# Patient Record
Sex: Female | Born: 1955 | Race: White | Hispanic: No | Marital: Single | Smoking: Former smoker
Health system: Southern US, Community
[De-identification: ages and names within clinical notes are randomized; demographics above are authoritative.]

## PROBLEM LIST (undated history)

## (undated) DIAGNOSIS — E785 Hyperlipidemia, unspecified: Secondary | ICD-10-CM

## (undated) DIAGNOSIS — G4730 Sleep apnea, unspecified: Secondary | ICD-10-CM

## (undated) DIAGNOSIS — K219 Gastro-esophageal reflux disease without esophagitis: Secondary | ICD-10-CM

## (undated) DIAGNOSIS — R0600 Dyspnea, unspecified: Secondary | ICD-10-CM

## (undated) DIAGNOSIS — H93293 Other abnormal auditory perceptions, bilateral: Secondary | ICD-10-CM

## (undated) DIAGNOSIS — J449 Chronic obstructive pulmonary disease, unspecified: Secondary | ICD-10-CM

## (undated) DIAGNOSIS — D649 Anemia, unspecified: Secondary | ICD-10-CM

## (undated) DIAGNOSIS — G2581 Restless legs syndrome: Secondary | ICD-10-CM

## (undated) DIAGNOSIS — B029 Zoster without complications: Secondary | ICD-10-CM

## (undated) DIAGNOSIS — E538 Deficiency of other specified B group vitamins: Secondary | ICD-10-CM

## (undated) DIAGNOSIS — L900 Lichen sclerosus et atrophicus: Secondary | ICD-10-CM

## (undated) DIAGNOSIS — I10 Essential (primary) hypertension: Secondary | ICD-10-CM

## (undated) DIAGNOSIS — Z973 Presence of spectacles and contact lenses: Secondary | ICD-10-CM

## (undated) DIAGNOSIS — Z9109 Other allergy status, other than to drugs and biological substances: Secondary | ICD-10-CM

## (undated) DIAGNOSIS — A6000 Herpesviral infection of urogenital system, unspecified: Secondary | ICD-10-CM

## (undated) DIAGNOSIS — F988 Other specified behavioral and emotional disorders with onset usually occurring in childhood and adolescence: Secondary | ICD-10-CM

## (undated) DIAGNOSIS — K5790 Diverticulosis of intestine, part unspecified, without perforation or abscess without bleeding: Secondary | ICD-10-CM

## (undated) DIAGNOSIS — K2270 Barrett's esophagus without dysplasia: Secondary | ICD-10-CM

## (undated) DIAGNOSIS — E669 Obesity, unspecified: Secondary | ICD-10-CM

## (undated) DIAGNOSIS — Z8719 Personal history of other diseases of the digestive system: Secondary | ICD-10-CM

## (undated) DIAGNOSIS — R29898 Other symptoms and signs involving the musculoskeletal system: Secondary | ICD-10-CM

## (undated) DIAGNOSIS — F419 Anxiety disorder, unspecified: Secondary | ICD-10-CM

## (undated) DIAGNOSIS — K635 Polyp of colon: Secondary | ICD-10-CM

## (undated) DIAGNOSIS — M1990 Unspecified osteoarthritis, unspecified site: Secondary | ICD-10-CM

## (undated) DIAGNOSIS — Z8669 Personal history of other diseases of the nervous system and sense organs: Secondary | ICD-10-CM

## (undated) DIAGNOSIS — B001 Herpesviral vesicular dermatitis: Secondary | ICD-10-CM

## (undated) HISTORY — DX: Unspecified osteoarthritis, unspecified site: M19.90

## (undated) HISTORY — DX: Herpesviral vesicular dermatitis: B00.1

## (undated) HISTORY — PX: OTHER SURGICAL HISTORY: SHX169

## (undated) HISTORY — DX: Essential (primary) hypertension: I10

## (undated) HISTORY — DX: Deficiency of other specified B group vitamins: E53.8

## (undated) HISTORY — DX: Other specified behavioral and emotional disorders with onset usually occurring in childhood and adolescence: F98.8

## (undated) HISTORY — DX: Anemia, unspecified: D64.9

## (undated) HISTORY — DX: Anxiety disorder, unspecified: F41.9

## (undated) HISTORY — DX: Polyp of colon: K63.5

## (undated) HISTORY — DX: Gastro-esophageal reflux disease without esophagitis: K21.9

## (undated) HISTORY — DX: Lichen sclerosus et atrophicus: L90.0

## (undated) HISTORY — PX: INDUCED ABORTION: SHX677

## (undated) HISTORY — PX: JOINT REPLACEMENT: SHX530

## (undated) HISTORY — DX: Hyperlipidemia, unspecified: E78.5

## (undated) HISTORY — DX: Chronic obstructive pulmonary disease, unspecified: J44.9

## (undated) HISTORY — DX: Other abnormal auditory perceptions, bilateral: H93.293

---
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
## 1996-09-08 HISTORY — PX: BREAST CYST ASPIRATION: SHX578

## 2000-01-24 ENCOUNTER — Encounter: Admission: RE | Admit: 2000-01-24 | Discharge: 2000-01-24 | Payer: Self-pay | Admitting: Infectious Diseases

## 2003-09-09 HISTORY — PX: PLANTAR FASCIA SURGERY: SHX746

## 2004-10-23 ENCOUNTER — Ambulatory Visit: Payer: Self-pay

## 2005-11-19 ENCOUNTER — Ambulatory Visit: Payer: Self-pay

## 2005-11-25 ENCOUNTER — Ambulatory Visit: Payer: Self-pay

## 2007-01-28 ENCOUNTER — Ambulatory Visit: Payer: Self-pay

## 2007-02-02 ENCOUNTER — Ambulatory Visit: Payer: Self-pay

## 2007-06-22 ENCOUNTER — Ambulatory Visit: Payer: Self-pay

## 2007-06-22 ENCOUNTER — Other Ambulatory Visit: Payer: Self-pay

## 2007-07-21 ENCOUNTER — Ambulatory Visit: Payer: Self-pay

## 2008-02-04 ENCOUNTER — Ambulatory Visit: Payer: Self-pay

## 2008-05-30 ENCOUNTER — Ambulatory Visit: Payer: Self-pay | Admitting: Gastroenterology

## 2008-09-08 HISTORY — PX: BUNIONECTOMY: SHX129

## 2008-12-27 ENCOUNTER — Ambulatory Visit: Payer: Self-pay | Admitting: Unknown Physician Specialty

## 2009-02-07 ENCOUNTER — Ambulatory Visit: Payer: Self-pay

## 2009-06-14 ENCOUNTER — Ambulatory Visit: Payer: Self-pay | Admitting: Podiatry

## 2010-04-16 ENCOUNTER — Ambulatory Visit: Payer: Self-pay

## 2010-09-17 ENCOUNTER — Emergency Department: Payer: Self-pay | Admitting: Emergency Medicine

## 2010-10-18 ENCOUNTER — Ambulatory Visit: Payer: Self-pay | Admitting: Internal Medicine

## 2011-04-23 ENCOUNTER — Ambulatory Visit: Payer: Self-pay | Admitting: Internal Medicine

## 2012-09-08 HISTORY — PX: COLONOSCOPY: SHX174

## 2012-12-14 ENCOUNTER — Ambulatory Visit: Payer: Self-pay | Admitting: Gastroenterology

## 2014-06-22 DIAGNOSIS — D239 Other benign neoplasm of skin, unspecified: Secondary | ICD-10-CM

## 2014-06-22 HISTORY — DX: Other benign neoplasm of skin, unspecified: D23.9

## 2015-08-06 ENCOUNTER — Other Ambulatory Visit: Payer: Self-pay | Admitting: Internal Medicine

## 2015-08-06 DIAGNOSIS — M79661 Pain in right lower leg: Secondary | ICD-10-CM

## 2015-08-09 ENCOUNTER — Ambulatory Visit
Admission: RE | Admit: 2015-08-09 | Discharge: 2015-08-09 | Disposition: A | Discharge status: Home / Self Care | Payer: BC Managed Care – PPO | Source: Ambulatory Visit | Attending: Internal Medicine | Admitting: Internal Medicine

## 2015-08-09 DIAGNOSIS — M79661 Pain in right lower leg: Secondary | ICD-10-CM | POA: Insufficient documentation

## 2016-01-31 DIAGNOSIS — F909 Attention-deficit hyperactivity disorder, unspecified type: Secondary | ICD-10-CM | POA: Insufficient documentation

## 2016-11-11 ENCOUNTER — Other Ambulatory Visit: Payer: Self-pay | Admitting: Certified Nurse Midwife

## 2016-11-27 ENCOUNTER — Other Ambulatory Visit: Payer: Self-pay | Admitting: Internal Medicine

## 2016-11-27 DIAGNOSIS — Z1231 Encounter for screening mammogram for malignant neoplasm of breast: Secondary | ICD-10-CM

## 2016-12-08 ENCOUNTER — Other Ambulatory Visit: Payer: Self-pay | Admitting: Internal Medicine

## 2016-12-08 ENCOUNTER — Ambulatory Visit
Admission: RE | Admit: 2016-12-08 | Discharge: 2016-12-08 | Disposition: A | Discharge status: Home / Self Care | Payer: BC Managed Care – PPO | Source: Ambulatory Visit | Attending: Internal Medicine | Admitting: Internal Medicine

## 2016-12-08 DIAGNOSIS — Z1231 Encounter for screening mammogram for malignant neoplasm of breast: Secondary | ICD-10-CM

## 2016-12-16 ENCOUNTER — Inpatient Hospital Stay
Admission: RE | Admit: 2016-12-16 | Discharge: 2016-12-16 | Disposition: A | Discharge status: Home / Self Care | Payer: Self-pay | Source: Ambulatory Visit | Attending: *Deleted | Admitting: *Deleted

## 2016-12-16 ENCOUNTER — Other Ambulatory Visit: Payer: Self-pay | Admitting: *Deleted

## 2016-12-16 DIAGNOSIS — Z1231 Encounter for screening mammogram for malignant neoplasm of breast: Secondary | ICD-10-CM

## 2016-12-25 ENCOUNTER — Ambulatory Visit: Payer: Self-pay | Admitting: Certified Nurse Midwife

## 2017-01-07 ENCOUNTER — Ambulatory Visit: Payer: Self-pay | Admitting: Certified Nurse Midwife

## 2017-02-03 ENCOUNTER — Ambulatory Visit (INDEPENDENT_AMBULATORY_CARE_PROVIDER_SITE_OTHER): Payer: BC Managed Care – PPO | Admitting: Certified Nurse Midwife

## 2017-02-03 ENCOUNTER — Encounter: Payer: Self-pay | Admitting: Certified Nurse Midwife

## 2017-02-03 VITALS — BP 124/78 | Ht 67.0 in | Wt 218.0 lb

## 2017-02-03 DIAGNOSIS — L900 Lichen sclerosus et atrophicus: Secondary | ICD-10-CM | POA: Diagnosis not present

## 2017-02-03 DIAGNOSIS — E7800 Pure hypercholesterolemia, unspecified: Secondary | ICD-10-CM

## 2017-02-03 DIAGNOSIS — E538 Deficiency of other specified B group vitamins: Secondary | ICD-10-CM

## 2017-02-03 DIAGNOSIS — Z124 Encounter for screening for malignant neoplasm of cervix: Secondary | ICD-10-CM

## 2017-02-03 DIAGNOSIS — Z01419 Encounter for gynecological examination (general) (routine) without abnormal findings: Secondary | ICD-10-CM | POA: Diagnosis not present

## 2017-02-03 DIAGNOSIS — I10 Essential (primary) hypertension: Secondary | ICD-10-CM

## 2017-02-03 MED ORDER — CLOBETASOL PROPIONATE 0.05 % EX CREA: 1.0000 "application " | TOPICAL_CREAM | Freq: Two times a day (BID) | CUTANEOUS | 1 refills | Status: DC

## 2017-02-03 NOTE — Patient Instructions (Signed)
Preventing Osteoporosis, Adult Osteoporosis is a condition that causes the bones to get weaker. With osteoporosis, the bones become thinner, and the normal spaces in bone tissue become larger. This can make the bones weak and cause them to break more easily. People who have osteoporosis are more likely to break their wrist, spine, or hip. Even a minor accident or injury can be enough to break weak bones. Osteoporosis can occur with aging. Your body constantly replaces old bone tissue with new tissue. As you get older, you may lose bone tissue more quickly, or it may be replaced more slowly. Osteoporosis is more likely to develop if you have poor nutrition or do not get enough calcium or vitamin D. Other lifestyle factors can also play a role. By making some diet and lifestyle changes, you can help to keep your bones healthy and help to prevent osteoporosis. What nutrition changes can be made? Nutrition plays an important role in maintaining healthy, strong bones.  Make sure you get enough calcium every day from food or from calcium supplements.  If you are age 49 or younger, aim to get 1,000 mg of calcium every day.  If you are older than age 66, aim to get 1,200 mg of calcium every day.  Try to get enough vitamin D every day.  If you are age 5 or younger, aim to get 600 international units (IU) every day.  If you are older than age 77, aim to get 800 international units every day.  Follow a healthy diet. Eat plenty of foods that contain calcium and vitamin D.  Calcium is in milk, cheese, yogurt, and other dairy products. Some fish and vegetables are also good sources of calcium. Many foods such as cereals and breads have had calcium added to them (are fortified). Check nutrition labels to see how much calcium is in a food or drink.  Foods that contain vitamin D include milk, cereals, salmon, and tuna. Your body also makes vitamin D when you are out in the sun. Bare skin exposure to the sun on  your face, arms, legs, or back for no more than 30 minutes a day, 2 times per week is more than enough. Beyond that, it is important to use sunblock to protect your skin from sunburn, which increases your risk for skin cancer. What lifestyle changes can be made? Making changes in your everyday life can also play an important role in preventing osteoporosis.  Stay active and get exercise every day. Ask your health care provider what types of exercise are best for you.  Do not use any products that contain nicotine or tobacco, such as cigarettes and e-cigarettes. If you need help quitting, ask your health care provider.  Limit alcohol intake to no more than 1 drink a day for nonpregnant women and 2 drinks a day for men. One drink equals 12 oz of beer, 5 oz of wine, or 1 oz of hard liquor. Why are these changes important? Making these nutrition and lifestyle changes can:  Help you develop and maintain healthy, strong bones.  Prevent loss of bone mass and the problems that are caused by that loss, such as broken bones and delayed healing.  Make you feel better mentally and physically. What can happen if changes are not made? Problems that can result from osteoporosis can be very serious. These may include:  A higher risk of broken bones that are painful and do not heal well.  Physical malformations, such as a collapsed spine  or a hunched back.  Problems with movement. Where to find support: If you need help making changes to prevent osteoporosis, talk with your health care provider. You can ask for a referral to a diet and nutrition specialist (dietitian) and a physical therapist. Where to find more information: Learn more about osteoporosis from:  NIH Osteoporosis and Related [place]: www.niams.GolfingGoddess.com.br  U.S. Office on Women's Health:  SouvenirBaseball.es.html  National Osteoporosis Foundation: ProfilePeek.ch Summary  Osteoporosis is a condition that causes weak bones that are more likely to break.  Eating a healthy diet and making sure you get enough calcium and vitamin D can help prevent osteoporosis.  Other ways to reduce your risk of osteoporosis include getting regular exercise and avoiding alcohol and products that contain nicotine or tobacco. This information is not intended to replace advice given to you by your health care provider. Make sure you discuss any questions you have with your health care provider. Document Released: 09/09/2015 Document Revised: 05/05/2016 Document Reviewed: 05/05/2016 Elsevier Interactive Patient Education  6295 [employer] Lichen Sclerosus Lichen sclerosus is a skin problem. It can happen on any part of the body, but it commonly involves the anal or genital areas. It can cause itching and discomfort in these areas. Treatment can help to control symptoms. When the genital area is affected, getting treatment is important because the condition can cause scarring that may lead to other problems. What are the causes? The cause of this condition is not known. It could be the result of an overactive immune system or a lack of certain hormones. Lichen sclerosus is not an infection or a fungus. It is not passed from one person to another (not contagious). What increases the risk? This condition is more likely to develop in women, usually after menopause. What are the signs or symptoms? Symptoms of this condition include:  Thin, wrinkled, white areas on the skin.  Thickened white areas on the skin.  Red and swollen patches (lesions) on the skin.  Tears or cracks in the skin.  Bruising.  Blood blisters.  Severe itching. You may also have pain, itching, or burning with urination. Constipation is also  common in people with lichen sclerosus. How is this diagnosed? This condition may be diagnosed with a physical exam. In some cases, a tissue sample (biopsy sample) may be removed to be looked at under a microscope. How is this treated? This condition is usually treated with medicated creams or ointments (topical steroids) that are applied over the affected areas. Follow these instructions at home:  Take over-the-counter and prescription medicines only as told by your health care provider.  Use creams or ointments as told by your health care provider.  Do not scratch the affected areas of skin.  Women should keep the vaginal area as clean and dry as possible.  Keep all follow-up visits as told by your health care provider. This is important. Contact a health care provider if:  You have increasing redness, swelling, or pain in the affected area.  You have fluid, blood, or pus coming from the affected area.  You have new lesions on your skin.  You have a fever.  You have pain during sex. This information is not intended to replace advice given to you by your health care provider. Make sure you discuss any questions you have with your health care provider. Document Released: 01/15/2011 Document Revised: 01/31/2016 Document Reviewed: 11/20/2014 Elsevier Interactive Patient Education  2017 [employer].

## 2017-02-03 NOTE — Progress Notes (Signed)
Gynecology Annual Exam  PCP: Idelle Crouch, MD  Chief Complaint:  Chief Complaint  Patient presents with  . Annual Exam    History of Present Illness:Mackenzie [name] is a 61 year old White female , G 3 P 2 0 1 2 who presents for her annual exam. She has lichen sclerosis and has had frequent vulvar itching since her last visit. She uses the clobetasol prn itching. She has probably used the steroid cream 10x in the past year.  Her menses are absent and she is postmenopausal since 2011.  She has had no spotting. Has an occasional hot flash   The patient's past medical history is notable for a history of lichen sclerosis, anemia, osteoarthritis, vitamin B12 deficiency, hyperlipidemia, GERD, hypertension,and ADD.Marland Kitchen  Since her last annual GYN exam dated 1/ 24/2017, she she has been diagnosed with COPD. SHe quit smoking Nov 2017. She is sexually active.  She denies dyspareunia.  Her most recent pap smear was obtained 10/02/15 and was negative. She has had four normal Pap smears since her ASCUS R/O HGSIL Pap and a colpo/bx that was negative in 2012.  Her most recent mammogram obtained on 12/08/2016 was negative.. There is a positive history of breast cancer in her maternal second cousin and paternal grandmother. Genetic testing has not been done. There is no family history of ovarian cancer. The patient does not do monthly self breast exams.  She had a colonoscopy in April 2014 by Dr Donnella Sham that she reports was positive for colon polyps. . Her next colonoscopy is due in 5 years. She had a bone density scan in 2015 that showed mild osteopenia of her spine, but hip T score was normal.   The patient is a former smoker.  The patient does drink occasionally  The patient does not use illegal drugs.  The patient does not exercise..  The patient does not get adequate calcium in her diet.  She had a recent cholesterol screen in 2018 which was normal on her statin.   The patient denies current symptoms  of depression.    Review of Systems: Review of Systems  Constitutional: Negative for chills, fever and weight loss.  HENT: Negative for congestion, sinus pain and sore throat.   Eyes: Negative for blurred vision and pain.  Respiratory: Negative for hemoptysis, shortness of breath and wheezing.   Cardiovascular: Negative for chest pain, palpitations and leg swelling.  Gastrointestinal: Negative for abdominal pain, blood in stool, diarrhea, heartburn, nausea and vomiting.  Genitourinary: Negative for dysuria, frequency, hematuria and urgency.       Positive for vulvar itching  Musculoskeletal: Positive for joint pain (arms). Negative for back pain and myalgias.  Skin: Negative for itching and rash.  Neurological: Negative for dizziness, tingling and headaches.  Endo/Heme/Allergies: Negative for environmental allergies and polydipsia. Does not bruise/bleed easily.       Negative for hirsutism   Psychiatric/Behavioral: Negative for depression. The patient has insomnia. The patient is not nervous/anxious.     Past Medical History:  Past Medical History:  Diagnosis Date  . Anemia   . Anxiety   . COPD (chronic obstructive pulmonary disease) ([place])   . Essential hypertension   . Hyperlipidemia   . Osteoarthritis   . Vitamin B12 deficiency     Past Surgical History:  Past Surgical History:  Procedure Laterality Date  . BREAST CYST ASPIRATION Right    neg  . BUNIONECTOMY Bilateral   . COLONOSCOPY  2014  Family History:  Family History  Problem Relation Age of Onset  . Breast cancer Cousin 68       mat second cousin  . Liver cancer Maternal Aunt   . Alcoholism Mother   . Hypertension Mother   . Diabetes Mother   . Congestive Heart Failure Mother   . Diabetes Maternal Grandmother   . Breast cancer Paternal Grandmother 50    Social History:  Social History   Social History  . Marital status: Single    Spouse name: N/A  . Number of children: 2  . Years of education:  N/A   Occupational History  . Not on file.   Social History Main Topics  . Smoking status: Former Research scientist (life sciences)  . Smokeless tobacco: Never Used  . Alcohol use 0.0 - 1.2 oz/week  . Drug use: No  . Sexual activity: Yes    Partners: Male    Birth control/ protection: Post-menopausal   Other Topics Concern  . Not on file   Social History Narrative  . No narrative on file    Allergies:  No Known Allergies  Medications: Prior to Admission medications   Medication Sig Start Date End Date Taking? Authorizing Provider  ALPRAZolam (XANAX) 0.5 MG tablet TAKE 1 TABLET BY MOUTH NIGHTLY AS NEEDED 12/29/16  Yes [provider]  amphetamine-dextroamphetamine (ADDERALL) 20 MG tablet TAKE 1 TABLET BY MOUTH 2 TIMES DAILY *DNF 12/28/16* 01/06/17  Yes [provider]  celecoxib (CELEBREX) 200 MG capsule Take 200 mg by mouth daily. 01/12/17  Yes [provider]  clobetasol ointment (TEMOVATE) 0.05 %  03/20/14  Yes [provider]  cyanocobalamin (,VITAMIN B-12,) 1000 MCG/ML injection Inject into the muscle. 02/05/16  Yes [provider]  ferrous sulfate 325 (65 FE) MG tablet TAKE 1 TABLET (325 MG TOTAL) BY MOUTH 2 (TWO) TIMES DAILY. 01/12/17  Yes [provider]  hydrochlorothiazide (HYDRODIURIL) 25 MG tablet TAKE 1 TABLET (25 MG TOTAL) BY MOUTH EVERY MORNING. 01/21/17  Yes [provider]  lovastatin (MEVACOR) 40 MG tablet TAKE 1 TABLET (40 MG TOTAL) BY MOUTH NIGHTLY. 01/25/17  Yes [provider]    Physical Exam Vitals: BP 124/78   Wt 218 lb (98.9 kg) , BMI 34.14 kg/m2  General: NAD HEENT: normocephalic, anicteric Neck: no thyroid enlargement, no palpable nodules, no cervical lymphadenopathy  Pulmonary: No increased work of breathing, CTAB Cardiovascular: RRR, without murmur  Breast: Breast symmetrical, no tenderness, no palpable nodules or masses, no skin or nipple retraction present, no nipple discharge.  No axillary, infraclavicular  or supraclavicular lymphadenopathy. Abdomen: Soft, non-tender, non-distended.  Umbilicus without lesions.  No hepatomegaly or masses palpable. No evidence of hernia. Genitourinary:  External: whitening at introitus, perineum, and perirectal areas  Vagina: Normal vaginal mucosa, no evidence of prolapse.    Cervix: Grossly normal in appearance, no bleeding, non-tender  Uterus: Retroverted, normal size, shape, and consistency, mobile, and non-tender  Adnexa: No adnexal masses, non-tender  Rectal: deferred  Lymphatic: no evidence of inguinal lymphadenopathy Extremities: no edema, erythema, or tenderness Neurologic: Grossly intact Psychiatric: mood appropriate, affect full     Assessment: 61 y.o. [id number] well woman exam Lichen sclerosis et atrophicus  Plan:   1) Breast cancer screening - recommend monthly self breast exam and annual mammograms  2) RX for clobetasol ointment 0.05% -use BID x 2 weeks then daily until symptoms resolved.  3) Cervical cancer screening - Pap was done.   4) Routine healthcare maintenance including cholesterol and diabetes screening managed  by PCP   5) Discussed calcium and vitamin D3 requirements as well as exercises to decrease risk of osteoporosis  6) Colonoscopy due next year.  7) RTO 1 year and prn  Dalia Heading, CNM

## 2017-02-05 LAB — IGP, APTIMA HPV
HPV Aptima: NEGATIVE
PAP Smear Comment: 0

## 2017-02-08 ENCOUNTER — Encounter: Payer: Self-pay | Admitting: Certified Nurse Midwife

## 2017-02-08 DIAGNOSIS — J449 Chronic obstructive pulmonary disease, unspecified: Secondary | ICD-10-CM | POA: Insufficient documentation

## 2017-02-08 DIAGNOSIS — I10 Essential (primary) hypertension: Secondary | ICD-10-CM | POA: Insufficient documentation

## 2017-02-08 DIAGNOSIS — E538 Deficiency of other specified B group vitamins: Secondary | ICD-10-CM | POA: Insufficient documentation

## 2017-02-08 DIAGNOSIS — E785 Hyperlipidemia, unspecified: Secondary | ICD-10-CM | POA: Insufficient documentation

## 2017-02-08 DIAGNOSIS — L900 Lichen sclerosus et atrophicus: Secondary | ICD-10-CM | POA: Insufficient documentation

## 2017-02-08 DIAGNOSIS — D649 Anemia, unspecified: Secondary | ICD-10-CM | POA: Insufficient documentation

## 2017-08-05 ENCOUNTER — Other Ambulatory Visit: Payer: Self-pay | Admitting: Internal Medicine

## 2017-08-05 DIAGNOSIS — M5137 Other intervertebral disc degeneration, lumbosacral region: Secondary | ICD-10-CM

## 2017-08-05 DIAGNOSIS — M51379 Other intervertebral disc degeneration, lumbosacral region without mention of lumbar back pain or lower extremity pain: Secondary | ICD-10-CM

## 2017-08-07 ENCOUNTER — Ambulatory Visit: Payer: BC Managed Care – PPO

## 2017-09-08 DIAGNOSIS — K2270 Barrett's esophagus without dysplasia: Secondary | ICD-10-CM

## 2017-09-08 DIAGNOSIS — H93293 Other abnormal auditory perceptions, bilateral: Secondary | ICD-10-CM

## 2017-09-08 HISTORY — DX: Barrett's esophagus without dysplasia: K22.70

## 2017-09-08 HISTORY — DX: Other abnormal auditory perceptions, bilateral: H93.293

## 2017-09-09 ENCOUNTER — Ambulatory Visit: Admission: RE | Admit: 2017-09-09 | Payer: BC Managed Care – PPO | Source: Ambulatory Visit

## 2017-09-23 ENCOUNTER — Ambulatory Visit
Admission: RE | Admit: 2017-09-23 | Discharge: 2017-09-23 | Disposition: A | Discharge status: Home / Self Care | Payer: BC Managed Care – PPO | Source: Ambulatory Visit | Attending: Internal Medicine | Admitting: Internal Medicine

## 2017-09-23 DIAGNOSIS — M5137 Other intervertebral disc degeneration, lumbosacral region: Secondary | ICD-10-CM | POA: Diagnosis present

## 2017-09-23 DIAGNOSIS — M48061 Spinal stenosis, lumbar region without neurogenic claudication: Secondary | ICD-10-CM | POA: Insufficient documentation

## 2017-09-23 DIAGNOSIS — M4317 Spondylolisthesis, lumbosacral region: Secondary | ICD-10-CM | POA: Insufficient documentation

## 2017-11-24 ENCOUNTER — Other Ambulatory Visit: Payer: Self-pay | Admitting: Internal Medicine

## 2017-11-24 DIAGNOSIS — Z1231 Encounter for screening mammogram for malignant neoplasm of breast: Secondary | ICD-10-CM

## 2017-12-11 ENCOUNTER — Ambulatory Visit
Admission: RE | Admit: 2017-12-11 | Discharge: 2017-12-11 | Disposition: A | Discharge status: Home / Self Care | Payer: BC Managed Care – PPO | Source: Ambulatory Visit | Attending: Internal Medicine | Admitting: Internal Medicine

## 2017-12-11 DIAGNOSIS — Z1231 Encounter for screening mammogram for malignant neoplasm of breast: Secondary | ICD-10-CM | POA: Diagnosis present

## 2018-04-17 ENCOUNTER — Other Ambulatory Visit: Payer: Self-pay | Admitting: Neurosurgery

## 2018-04-17 DIAGNOSIS — R1031 Right lower quadrant pain: Secondary | ICD-10-CM

## 2018-04-29 ENCOUNTER — Ambulatory Visit
Admission: RE | Admit: 2018-04-29 | Discharge: 2018-04-29 | Disposition: A | Discharge status: Home / Self Care | Payer: BC Managed Care – PPO | Source: Ambulatory Visit | Attending: Neurosurgery | Admitting: Neurosurgery

## 2018-04-29 DIAGNOSIS — R1031 Right lower quadrant pain: Secondary | ICD-10-CM

## 2018-05-21 ENCOUNTER — Encounter: Payer: Self-pay | Admitting: *Deleted

## 2018-05-24 ENCOUNTER — Ambulatory Visit
Admission: RE | Admit: 2018-05-24 | Discharge: 2018-05-24 | Disposition: A | Discharge status: Home / Self Care | Payer: BC Managed Care – PPO | Source: Ambulatory Visit | Attending: Gastroenterology | Admitting: Gastroenterology

## 2018-05-24 ENCOUNTER — Ambulatory Visit: Payer: BC Managed Care – PPO | Admitting: Certified Registered Nurse Anesthetist

## 2018-05-24 ENCOUNTER — Encounter: Payer: Self-pay | Admitting: Anesthesiology

## 2018-05-24 ENCOUNTER — Encounter
Admission: RE | Disposition: A | Discharge status: Home / Self Care | Payer: Self-pay | Source: Ambulatory Visit | Attending: Gastroenterology

## 2018-05-24 DIAGNOSIS — K5730 Diverticulosis of large intestine without perforation or abscess without bleeding: Secondary | ICD-10-CM | POA: Diagnosis not present

## 2018-05-24 DIAGNOSIS — J449 Chronic obstructive pulmonary disease, unspecified: Secondary | ICD-10-CM | POA: Insufficient documentation

## 2018-05-24 DIAGNOSIS — M1990 Unspecified osteoarthritis, unspecified site: Secondary | ICD-10-CM | POA: Insufficient documentation

## 2018-05-24 DIAGNOSIS — Z86010 Personal history of colonic polyps: Secondary | ICD-10-CM | POA: Insufficient documentation

## 2018-05-24 DIAGNOSIS — K2950 Unspecified chronic gastritis without bleeding: Secondary | ICD-10-CM | POA: Diagnosis not present

## 2018-05-24 DIAGNOSIS — Z6834 Body mass index (BMI) 34.0-34.9, adult: Secondary | ICD-10-CM | POA: Insufficient documentation

## 2018-05-24 DIAGNOSIS — I10 Essential (primary) hypertension: Secondary | ICD-10-CM | POA: Insufficient documentation

## 2018-05-24 DIAGNOSIS — E538 Deficiency of other specified B group vitamins: Secondary | ICD-10-CM | POA: Insufficient documentation

## 2018-05-24 DIAGNOSIS — K449 Diaphragmatic hernia without obstruction or gangrene: Secondary | ICD-10-CM | POA: Insufficient documentation

## 2018-05-24 DIAGNOSIS — Z1211 Encounter for screening for malignant neoplasm of colon: Secondary | ICD-10-CM | POA: Diagnosis present

## 2018-05-24 DIAGNOSIS — Z79899 Other long term (current) drug therapy: Secondary | ICD-10-CM | POA: Diagnosis not present

## 2018-05-24 DIAGNOSIS — E669 Obesity, unspecified: Secondary | ICD-10-CM | POA: Diagnosis not present

## 2018-05-24 DIAGNOSIS — K640 First degree hemorrhoids: Secondary | ICD-10-CM | POA: Diagnosis not present

## 2018-05-24 DIAGNOSIS — F419 Anxiety disorder, unspecified: Secondary | ICD-10-CM | POA: Diagnosis not present

## 2018-05-24 DIAGNOSIS — K3189 Other diseases of stomach and duodenum: Secondary | ICD-10-CM | POA: Diagnosis not present

## 2018-05-24 DIAGNOSIS — E785 Hyperlipidemia, unspecified: Secondary | ICD-10-CM | POA: Insufficient documentation

## 2018-05-24 HISTORY — PX: COLONOSCOPY WITH PROPOFOL: SHX5780

## 2018-05-24 HISTORY — PX: ESOPHAGOGASTRODUODENOSCOPY (EGD) WITH PROPOFOL: SHX5813

## 2018-05-24 SURGERY — COLONOSCOPY WITH PROPOFOL
Anesthesia: General

## 2018-05-24 MED ORDER — FENTANYL CITRATE (PF) 100 MCG/2ML IJ SOLN
INTRAMUSCULAR | Status: DC | PRN
  Administered 2018-05-24: 100 ug via INTRAVENOUS

## 2018-05-24 MED ORDER — PHENYLEPHRINE HCL 10 MG/ML IJ SOLN
INTRAMUSCULAR | Status: AC
  Filled 2018-05-24: qty 1

## 2018-05-24 MED ORDER — FENTANYL CITRATE (PF) 100 MCG/2ML IJ SOLN
INTRAMUSCULAR | Status: AC
  Filled 2018-05-24: qty 2

## 2018-05-24 MED ORDER — PROPOFOL 500 MG/50ML IV EMUL
INTRAVENOUS | Status: AC
  Filled 2018-05-24: qty 100

## 2018-05-24 MED ORDER — LIDOCAINE HCL (CARDIAC) PF 100 MG/5ML IV SOSY
PREFILLED_SYRINGE | INTRAVENOUS | Status: DC | PRN
  Administered 2018-05-24: 100 mg via INTRAVENOUS

## 2018-05-24 MED ORDER — GLYCOPYRROLATE 0.2 MG/ML IJ SOLN
INTRAMUSCULAR | Status: DC | PRN
  Administered 2018-05-24: 0.2 mg via INTRAVENOUS

## 2018-05-24 MED ORDER — PROPOFOL 10 MG/ML IV BOLUS
INTRAVENOUS | Status: DC | PRN
  Administered 2018-05-24: 50 mg via INTRAVENOUS
  Administered 2018-05-24: 30 mg via INTRAVENOUS
  Administered 2018-05-24: 20 mg via INTRAVENOUS

## 2018-05-24 MED ORDER — PHENYLEPHRINE HCL 10 MG/ML IJ SOLN
INTRAMUSCULAR | Status: DC | PRN
  Administered 2018-05-24: 100 ug via INTRAVENOUS

## 2018-05-24 MED ORDER — GLYCOPYRROLATE 0.2 MG/ML IJ SOLN
INTRAMUSCULAR | Status: AC
  Filled 2018-05-24: qty 1

## 2018-05-24 MED ORDER — SODIUM CHLORIDE 0.9 % IV SOLN
INTRAVENOUS | Status: DC
  Administered 2018-05-24: 1000 mL via INTRAVENOUS

## 2018-05-24 MED ORDER — LIDOCAINE HCL (PF) 2 % IJ SOLN
INTRAMUSCULAR | Status: AC
  Filled 2018-05-24: qty 10

## 2018-05-24 MED ORDER — PROPOFOL 500 MG/50ML IV EMUL
INTRAVENOUS | Status: DC | PRN
  Administered 2018-05-24: 125 ug/kg/min via INTRAVENOUS

## 2018-05-24 MED ORDER — MIDAZOLAM HCL 2 MG/2ML IJ SOLN
INTRAMUSCULAR | Status: DC | PRN
  Administered 2018-05-24: 2 mg via INTRAVENOUS

## 2018-05-24 MED ORDER — PROPOFOL 500 MG/50ML IV EMUL
INTRAVENOUS | Status: AC
  Filled 2018-05-24: qty 50

## 2018-05-24 MED ORDER — MIDAZOLAM HCL 2 MG/2ML IJ SOLN
INTRAMUSCULAR | Status: AC
  Filled 2018-05-24: qty 2

## 2018-05-24 MED ORDER — SUCCINYLCHOLINE CHLORIDE 20 MG/ML IJ SOLN
INTRAMUSCULAR | Status: AC
  Filled 2018-05-24: qty 1

## 2018-05-24 NOTE — Anesthesia Preprocedure Evaluation (Signed)
Anesthesia Evaluation  Patient identified by MRN, date of birth, ID band Patient awake    Reviewed: Allergy & Precautions, NPO status , Patient's Chart, lab work & pertinent test results, reviewed documented beta blocker date and time   Airway Mallampati: III  TM Distance: >3 FB     Dental  (+) Chipped   Pulmonary COPD, former smoker,           Cardiovascular hypertension, Pt. on medications      Neuro/Psych Anxiety    GI/Hepatic   Endo/Other    Renal/GU      Musculoskeletal  (+) Arthritis ,   Abdominal   Peds  Hematology  (+) anemia ,   Anesthesia Other Findings Obese.  Reproductive/Obstetrics                             Anesthesia Physical Anesthesia Plan  ASA: II  Anesthesia Plan: General   Post-op Pain Management:    Induction: Intravenous  PONV Risk Score and Plan:   Airway Management Planned:   Additional Equipment:   Intra-op Plan:   Post-operative Plan:   Informed Consent: I have reviewed the patients History and Physical, chart, labs and discussed the procedure including the risks, benefits and alternatives for the proposed anesthesia with the patient or authorized representative who has indicated his/her understanding and acceptance.     Plan Discussed with: CRNA  Anesthesia Plan Comments:         Anesthesia Quick Evaluation

## 2018-05-24 NOTE — H&P (Signed)
Outpatient short stay form Pre-procedure 05/24/2018 7:30 AM Mackenzie Sails MD  Primary Physician: Dr. Fulton Reek  Reason for visit: EGD and colonoscopy  History of present illness: Patient is a 62 year old female presenting today as above.  She has personal history of adenomatous colon polyps with her last colonoscopy being in 2014.  Also had been having problems with acid reflux and indigestion.  She was placed on a PPI when she was seen in the office about 8 weeks ago and this is much improved.  She continues to take Protonix 40 mg once a day.  She has no problems with rectal bleeding abdominal pain or diarrhea.  Tolerating her prep well.  She takes no aspirin or blood thinning agent.    Current Facility-Administered Medications:  .  0.9 %  sodium chloride infusion, , Intravenous, Continuous, Mackenzie Sails, MD, Last Rate: 20 mL/hr at 05/24/18 0721, 1,000 mL at 05/24/18 1779  Medications Prior to Admission  Medication Sig Dispense Refill Last Dose  . ALPRAZolam (XANAX) 0.5 MG tablet TAKE 1 TABLET BY MOUTH NIGHTLY AS NEEDED  5 Past Week at Unknown time  . amphetamine-dextroamphetamine (ADDERALL) 20 MG tablet TAKE 1 TABLET BY MOUTH 2 TIMES DAILY *DNF 12/28/16*  0 05/23/2018 at Unknown time  . celecoxib (CELEBREX) 200 MG capsule Take 200 mg by mouth daily.  5 05/23/2018 at Unknown time  . clobetasol cream (TEMOVATE) 3.90 % Apply 1 application topically 2 (two) times daily. For 2 weeks then decrease to once daily at hs 30 g 1 05/23/2018 at Unknown time  . cyanocobalamin (,VITAMIN B-12,) 1000 MCG/ML injection Inject into the muscle.   Past Week at Unknown time  . ferrous sulfate 325 (65 FE) MG tablet TAKE 1 TABLET (325 MG TOTAL) BY MOUTH 2 (TWO) TIMES DAILY.  3 Past Week at Unknown time  . hydrochlorothiazide (HYDRODIURIL) 25 MG tablet TAKE 1 TABLET (25 MG TOTAL) BY MOUTH EVERY MORNING.  11 Past Week at Unknown time  . lovastatin (MEVACOR) 40 MG tablet TAKE 1 TABLET (40 MG TOTAL) BY MOUTH  NIGHTLY.  3 Past Week at Unknown time     No Known Allergies   Past Medical History:  Diagnosis Date  . Anemia   . Anxiety   . COPD (chronic obstructive pulmonary disease) ([place])   . Essential hypertension   . Hyperlipidemia   . Osteoarthritis   . Vitamin B12 deficiency     Review of systems:      Physical Exam    Heart and lungs: Regular rate and rhythm without rub or gallop, lungs are bilaterally clear.    HEENT: Normal cephalic atraumatic eyes are anicteric    Other:    Pertinant exam for procedure: Soft nontender nondistended bowel sounds positive normoactive    Planned proceedures: EGD, colonoscopy and indicated procedures. I have discussed the risks benefits and complications of procedures to include not limited to bleeding, infection, perforation and the risk of sedation and the patient wishes to proceed.    Mackenzie Sails, MD Gastroenterology 05/24/2018  7:30 AM

## 2018-05-24 NOTE — Anesthesia Post-op Follow-up Note (Signed)
Anesthesia QCDR form completed.        

## 2018-05-24 NOTE — Transfer of Care (Signed)
Immediate Anesthesia Transfer of Care Note  Patient: Mackenzie [name]  Procedure(s) Performed: COLONOSCOPY WITH PROPOFOL (N/A ) ESOPHAGOGASTRODUODENOSCOPY (EGD) WITH PROPOFOL (N/A )  Patient Location: PACU  Anesthesia Type:General  Level of Consciousness: drowsy  Airway & Oxygen Therapy: Patient Spontanous Breathing  Post-op Assessment: Report given to RN and Post -op Vital signs reviewed and stable  Post vital signs: Reviewed and stable  Last Vitals:  Vitals Value Taken Time  BP 112/68 05/24/2018  8:42 AM  Temp    Pulse 66 05/24/2018  8:42 AM  Resp 15 05/24/2018  8:42 AM  SpO2 99 % 05/24/2018  8:42 AM  Vitals shown include unvalidated device data.  Last Pain:  Vitals:   05/24/18 0703  TempSrc: Tympanic  PainSc: 0-No pain         Complications: No apparent anesthesia complications

## 2018-05-24 NOTE — Anesthesia Procedure Notes (Signed)
Performed by: Raife Lizer, CRNA Pre-anesthesia Checklist: Patient identified, Emergency Drugs available, Suction available, Patient being monitored and Timeout performed Patient Re-evaluated:Patient Re-evaluated prior to induction Oxygen Delivery Method: Nasal cannula Induction Type: IV induction       

## 2018-05-24 NOTE — Op Note (Addendum)
[facility] Gastroenterology Patient Name: Mackenzie [name] Procedure Date: 05/24/2018 7:31 AM MRN: [id number] Account #: [id number] Date of Birth: May 22, 1956 Admit Type: Outpatient Age: 62 Room: [facility] ENDO ROOM 1 Gender: Female Note Status: Finalized Procedure:            Upper GI endoscopy Indications:          Dyspepsia Providers:            Lollie Sails, MD Referring MD:         Leonie Douglas. Doy Hutching, MD (Referring MD) Medicines:            Monitored Anesthesia Care Complications:        No immediate complications. Procedure:            Pre-Anesthesia Assessment:                       - ASA Grade Assessment: III - A patient with severe                        systemic disease.                       After obtaining informed consent, the endoscope was                        passed under direct vision. Throughout the procedure,                        the patient's blood pressure, pulse, and oxygen                        saturations were monitored continuously. The Endoscope                        was introduced through the mouth, and advanced to the                        third part of duodenum. The upper GI endoscopy was                        accomplished without difficulty. The patient tolerated                        the procedure well. Findings:      LA Grade B (one or more mucosal breaks greater than 5 mm, not extending       between the tops of two mucosal folds) esophagitis with no bleeding was       found. Biopsies were taken with a cold forceps for histology.      A small hiatal hernia was found. The Z-line was a variable distance from       incisors; the hiatal hernia was sliding.      Diffuse mild inflammation characterized by congestion (edema) and       erythema was found in the entire examined stomach. Biopsies were taken       with a cold forceps for histology.      The cardia and gastric fundus were normal on retroflexion.      Diffuse mild mucosal  variance characterized by smoothness was found in       the entire duodenum. Biopsies were taken with a  cold forceps for       histology.      The exam was otherwise without abnormality.      The Z-line was irregular. Biopsies were taken with a cold forceps for       histology. Impression:           - LA Grade B reflux esophagitis. Biopsied.                       - Small hiatal hernia.                       - Bile gastritis. Biopsied.                       - Mucosal variant in the duodenum. Biopsied. Recommendation:       - Await pathology results.                       - Use Protonix (pantoprazole) 40 mg PO daily daily.                       - Return to GI clinic in 4 weeks. Procedure Code(s):    --- Professional ---                       [phone], Esophagogastroduodenoscopy, flexible, transoral;                        with biopsy, single or multiple Diagnosis Code(s):    --- Professional ---                       K21.0, Gastro-esophageal reflux disease with esophagitis                       K44.9, Diaphragmatic hernia without obstruction or                        gangrene                       K29.60, Other gastritis without bleeding                       K31.89, Other diseases of stomach and duodenum                       R10.13, Epigastric pain CPT copyright 2017 American Medical Association. All rights reserved. The codes documented in this report are preliminary and upon coder review may  be revised to meet current compliance requirements. Lollie Sails, MD 05/24/2018 8:14:10 AM This report has been signed electronically. Number of Addenda: 0 Note Initiated On: 05/24/2018 7:31 AM      [facility]

## 2018-05-24 NOTE — Anesthesia Postprocedure Evaluation (Signed)
Anesthesia Post Note  Patient: Mackenzie [name]  Procedure(s) Performed: COLONOSCOPY WITH PROPOFOL (N/A ) ESOPHAGOGASTRODUODENOSCOPY (EGD) WITH PROPOFOL (N/A )  Patient location during evaluation: Endoscopy Anesthesia Type: General Level of consciousness: awake and alert Pain management: pain level controlled Vital Signs Assessment: post-procedure vital signs reviewed and stable Respiratory status: spontaneous breathing, nonlabored ventilation, respiratory function stable and patient connected to nasal cannula oxygen Cardiovascular status: blood pressure returned to baseline and stable Postop Assessment: no apparent nausea or vomiting Anesthetic complications: no     Last Vitals:  Vitals:   05/24/18 0703  BP: 117/71  Pulse: 67  Resp: 18  Temp: (!) 35.9 C  SpO2: 100%    Last Pain:  Vitals:   05/24/18 0842  TempSrc:   PainSc: 0-No pain                 Henretter Piekarski S

## 2018-05-24 NOTE — Op Note (Signed)
[facility] Patient Name: Mackenzie [name] Procedure Date: 05/24/2018 7:30 AM MRN: [id number] Account #: [id number] Date of Birth: 05/18/1956 Admit Type: Outpatient Age: 62 Room: [facility] ENDO ROOM 1 Gender: Female Note Status: Finalized Procedure:            Colonoscopy Indications:          Personal history of colonic polyps Providers:            Lollie Sails, MD Referring MD:         Leonie Douglas. Doy Hutching, MD (Referring MD) Medicines:            Monitored Anesthesia Care Complications:        No immediate complications. Procedure:            Pre-Anesthesia Assessment:                       - ASA Grade Assessment: III - A patient with severe                        systemic disease.                       After obtaining informed consent, the colonoscope was                        passed under direct vision. Throughout the procedure,                        the patient's blood pressure, pulse, and oxygen                        saturations were monitored continuously. The was                        introduced through the anus and advanced to the the                        cecum, identified by appendiceal orifice and ileocecal                        valve. The colonoscopy was performed without                        difficulty. The patient tolerated the procedure well.                        The quality of the bowel preparation was good except                        the ascending colon was fair. Findings:      Multiple small and large-mouthed diverticula were found in the sigmoid       colon and descending colon.      Non-bleeding internal hemorrhoids were found during retroflexion and       during anoscopy. The hemorrhoids were small and Grade I (internal       hemorrhoids that do not prolapse).      The exam was otherwise normal throughout the examined colon.      The digital rectal exam findings include perirectal depigmentation. Impression:            -  Diverticulosis in the sigmoid colon and in the                        descending colon.                       - Non-bleeding internal hemorrhoids.                       - Perirectal depigmentation. found on digital rectal                        exam.                       - No specimens collected. Recommendation:       - Discharge patient to home.                       - Repeat colonoscopy in 5 years for screening purposes. Procedure Code(s):    --- Professional ---                       [phone], Colonoscopy, flexible; diagnostic, including                        collection of specimen(s) by brushing or washing, when                        performed (separate procedure) Diagnosis Code(s):    --- Professional ---                       K64.0, First degree hemorrhoids                       Z86.010, Personal history of colonic polyps                       K57.30, Diverticulosis of large intestine without                        perforation or abscess without bleeding CPT copyright 2017 American Medical Association. All rights reserved. The codes documented in this report are preliminary and upon coder review may  be revised to meet current compliance requirements. Lollie Sails, MD 05/24/2018 8:43:05 AM This report has been signed electronically. Number of Addenda: 0 Note Initiated On: 05/24/2018 7:30 AM Scope Withdrawal Time: 0 hours 5 minutes 10 seconds  Total Procedure Duration: 0 hours 19 minutes 21 seconds       [facility]

## 2018-05-25 ENCOUNTER — Encounter: Payer: Self-pay | Admitting: Gastroenterology

## 2018-05-27 LAB — SURGICAL PATHOLOGY

## 2018-06-08 NOTE — Progress Notes (Signed)
Gynecology Annual Exam  PCP: Idelle Crouch, MD  Chief Complaint:  Chief Complaint  Patient presents with  . Gynecologic Exam    up for hip replacement    History of Present Illness:Mackenzie [name] is a 62 year old White female , G 3 P 2 0 1 2 who presents for her annual exam. She has lichen sclerosis and has had occasional vulvar itching since her last visit. She uses the clobetasol prn itching.   Her menses are absent and she is postmenopausal since 2011.  She has had no spotting. Has an occasional hot flash   The patient's past medical history is notable for a history of lichen sclerosis, anemia, osteoarthritis, vitamin B12 deficiency, hyperlipidemia, GERD, COPD,  hypertension,and ADD.Marland Kitchen  Since her last annual GYN exam dated 02/03/2017, she has had increasing pain in her right hip and may be having a hip replacement. She also had a colonoscopy last month which showed diverticulosis and an EGD that revealed esophagitis, gastritis and a small hiatal hernia. She is sexually active.  She denies dyspareunia.  Her most recent pap smear was obtained 02/03/2017 and was NIL/negative HRHPV. She has had five normal Pap smears since her ASCUS R/O HGSIL Pap and a colpo/bx that was negative in 2012.  Her most recent mammogram obtained on 12/11/17 was negative. There is a positive history of breast cancer in her maternal second cousin and paternal grandmother. Genetic testing has not been done. There is no family history of ovarian cancer. The patient does not do monthly self breast exams.    Her last colonoscopy was 05/24/18 and the  next colonoscopy is due in ?5 years  She had a bone density scan in 2015 that showed mild osteopenia of her spine, but hip T score was normal.   The patient is a former smoker.  The patient does drink occasionally  The patient does not use illegal drugs.  The patient does not exercise..  The patient may not get adequate calcium in her diet.  She had a recent cholesterol  screen in 2019 which was OK on her statin   The patient denies current symptoms of depression.    Review of Systems: Review of Systems  Constitutional: Negative for chills, fever and weight loss.  HENT: Negative for congestion, sinus pain and sore throat.   Eyes: Negative for blurred vision and pain.  Respiratory: Negative for hemoptysis, shortness of breath and wheezing.   Cardiovascular: Negative for chest pain, palpitations and leg swelling.  Gastrointestinal: Negative for abdominal pain, blood in stool, diarrhea, heartburn, nausea and vomiting.  Genitourinary: Negative for dysuria, frequency, hematuria and urgency.       Positive for vulvar itching  Musculoskeletal: Positive for joint pain (in  right hip and in right arm). Negative for back pain and myalgias.  Skin: Negative for itching and rash.  Neurological: Negative for dizziness, tingling and headaches.  Endo/Heme/Allergies: Negative for environmental allergies and polydipsia. Does not bruise/bleed easily.       Negative for hirsutism   Psychiatric/Behavioral: Negative for depression. The patient has insomnia. The patient is not nervous/anxious.     Past Medical History:  Past Medical History:  Diagnosis Date  . ADD (attention deficit disorder)   . Anemia   . Anxiety   . Colon polyps   . COPD (chronic obstructive pulmonary disease) ([place])   . Essential hypertension   . GERD (gastroesophageal reflux disease)   . Hyperlipidemia   . Lichen sclerosus   .  Osteoarthritis   . Other abnormal auditory perceptions, bilateral 2019   hip and back issues  . Vitamin B12 deficiency     Past Surgical History:  Past Surgical History:  Procedure Laterality Date  . BREAST CYST ASPIRATION Right    neg  . broken leg repair    . BUNIONECTOMY Bilateral   . COLONOSCOPY  2014  . COLONOSCOPY WITH PROPOFOL N/A 05/24/2018   Procedure: COLONOSCOPY WITH PROPOFOL;  Surgeon: Lollie Sails, MD;  Location: [facility] ENDOSCOPY;  Service:  Endoscopy;  Laterality: N/A;  . ESOPHAGOGASTRODUODENOSCOPY (EGD) WITH PROPOFOL N/A 05/24/2018   Procedure: ESOPHAGOGASTRODUODENOSCOPY (EGD) WITH PROPOFOL;  Surgeon: Lollie Sails, MD;  Location: [facility] ENDOSCOPY;  Service: Endoscopy;  Laterality: N/A;  . INDUCED ABORTION    . PLANTAR FASCIA SURGERY      Family History:  Family History  Problem Relation Age of Onset  . Breast cancer Cousin 84       mat second cousin  . Liver cancer Maternal Aunt   . Alcoholism Mother   . Hypertension Mother   . Diabetes Mother        type 1  . Congestive Heart Failure Mother   . Diabetes Maternal Grandmother        type 2  . Breast cancer Paternal Grandmother 64  . Arthritis Sister   . Diabetes Sister   . Stroke Brother 24    Social History:  Social History   Socioeconomic History  . Marital status: Single    Spouse name: Not on file  . Number of children: 2  . Years of education: 20  . Highest education level: Not on file  Occupational History  . Occupation: Surveyor, minerals    Comment: school Network engineer  Social Needs  . Financial resource strain: Not on file  . Food insecurity:    Worry: Not on file    Inability: Not on file  . Transportation needs:    Medical: Not on file    Non-medical: Not on file  Tobacco Use  . Smoking status: Former Research scientist (life sciences)  . Smokeless tobacco: Never Used  Substance and Sexual Activity  . Alcohol use: Yes    Alcohol/week: 0.0 - 2.0 standard drinks  . Drug use: No  . Sexual activity: Yes    Partners: Male    Birth control/protection: Post-menopausal  Lifestyle  . Physical activity:    Days per week: Not on file    Minutes per session: Not on file  . Stress: Not on file  Relationships  . Social connections:    Talks on phone: Not on file    Gets together: Not on file    Attends religious service: Not on file    Active member of club or organization: Not on file    Attends meetings of clubs or organizations: Not on file    Relationship status:  Not on file  . Intimate partner violence:    Fear of current or ex partner: Not on file    Emotionally abused: Not on file    Physically abused: Not on file    Forced sexual activity: Not on file  Other Topics Concern  . Not on file  Social History Narrative  . Not on file    Allergies:  No Known Allergies  Medications:  Current Outpatient Medications on File Prior to Visit  Medication Sig Dispense Refill  . acyclovir ointment (ZOVIRAX) 5 % Apply 1 application topically as needed.    . ALPRAZolam (XANAX) 0.5 MG  tablet TAKE 1 TABLET BY MOUTH NIGHTLY AS NEEDED  5  . amphetamine-dextroamphetamine (ADDERALL) 30 MG tablet Take 1 tablet by mouth 2 (two) times daily.    .      . celecoxib (CELEBREX) 200 MG capsule Take 200 mg by mouth daily.  5  . clobetasol cream (TEMOVATE) 5.85 % Apply 1 application topically 2 (two) times daily. For 2 weeks then decrease to once daily at hs 30 g 1  . cyclobenzaprine (FLEXERIL) 10 MG tablet Take 1 tablet by mouth as needed.    . ferrous sulfate 325 (65 FE) MG tablet TAKE 1 TABLET (325 MG TOTAL) BY MOUTH 2 (TWO) TIMES DAILY.  3  . gabapentin (NEURONTIN) 100 MG capsule Take 1 capsule by mouth at bedtime.  5  . hydrochlorothiazide (HYDRODIURIL) 25 MG tablet TAKE 1 TABLET (25 MG TOTAL) BY MOUTH EVERY MORNING.  11  . lovastatin (MEVACOR) 40 MG tablet TAKE 1 TABLET (40 MG TOTAL) BY MOUTH NIGHTLY.  3  . pantoprazole (PROTONIX) 40 MG tablet Take 1 tablet by mouth daily. In am  3  . sucralfate (CARAFATE) 1 g tablet Take 1 tablet by mouth 4 (four) times daily.    . traMADol (ULTRAM) 50 MG tablet Take 1 tablet by mouth at bedtime.     No current facility-administered medications on file prior to visit.    Physical Exam Vitals: BP 130/70   Pulse 66   Ht 5\' 7"  (1.702 m)   Wt 216 lb (98 kg)   BMI 33.83 kg/m , BMI 34.14 kg/m2  General: WF in NAD HEENT: normocephalic, anicteric Neck: no thyroid enlargement, no palpable nodules, no cervical lymphadenopathy    Pulmonary: No increased work of breathing, CTAB Cardiovascular: RRR, without murmur  Breast: Breast symmetrical, no tenderness, no palpable nodules or masses, no skin or nipple retraction present, no nipple discharge.  No axillary, infraclavicular or supraclavicular lymphadenopathy. Abdomen: Soft, non-tender, non-distended.  Umbilicus without lesions.  No hepatomegaly or masses palpable. No evidence of hernia. Genitourinary:  External: erythema at introitus, whitening at perineum, and perirectal area is depigmented  Vagina: Normal vaginal mucosa, no evidence of prolapse, white discharge    Cervix: Grossly normal in appearance, no bleeding, non-tender  Uterus: Retroverted, normal size, shape, and consistency, mobile, and non-tender  Adnexa: No adnexal masses, non-tender  Rectal: deferred  Lymphatic: no evidence of inguinal lymphadenopathy Extremities: no edema, erythema, or tenderness Neurologic: Grossly intact Psychiatric: mood appropriate, affect full  Wet prep: positive for hyphae, negative for Trich or clue cells   Assessment: 62 y.o. [id number] well woman exam Lichen sclerosis et atrophicus Monilial vulvovaginitis  Terazol 7 cream-one applicator full qhs x 7 days. Apply externally BID prn  Plan:   1) Breast cancer screening - recommend monthly self breast exam and annual mammograms  2) Use clobetasol ointment 0.05% prn -use BID x 2 weeks then daily until symptoms resolved.  3) Cervical cancer screening - Pap was done.   4) Routine healthcare maintenance including cholesterol and diabetes screening managed by PCP   5) Discussed calcium and vitamin D3 requirements as well as exercises to decrease risk of osteoporosis Plan for DEXA next year.  6) Colon cancer screening: colonoscopy due in 5 years  7) RTO 1 year and prn  Dalia Heading, CNM

## 2018-06-09 ENCOUNTER — Ambulatory Visit (INDEPENDENT_AMBULATORY_CARE_PROVIDER_SITE_OTHER): Payer: BC Managed Care – PPO | Admitting: Certified Nurse Midwife

## 2018-06-09 ENCOUNTER — Encounter: Payer: Self-pay | Admitting: Certified Nurse Midwife

## 2018-06-09 ENCOUNTER — Other Ambulatory Visit (HOSPITAL_COMMUNITY)
Admission: RE | Admit: 2018-06-09 | Discharge: 2018-06-09 | Disposition: A | Discharge status: Home / Self Care | Payer: BC Managed Care – PPO | Source: Ambulatory Visit | Attending: Obstetrics and Gynecology | Admitting: Obstetrics and Gynecology

## 2018-06-09 VITALS — BP 130/70 | HR 66 | Ht 67.0 in | Wt 216.0 lb

## 2018-06-09 DIAGNOSIS — Z01411 Encounter for gynecological examination (general) (routine) with abnormal findings: Secondary | ICD-10-CM

## 2018-06-09 DIAGNOSIS — B373 Candidiasis of vulva and vagina: Secondary | ICD-10-CM

## 2018-06-09 DIAGNOSIS — Z01419 Encounter for gynecological examination (general) (routine) without abnormal findings: Secondary | ICD-10-CM

## 2018-06-09 DIAGNOSIS — B3731 Acute candidiasis of vulva and vagina: Secondary | ICD-10-CM

## 2018-06-09 DIAGNOSIS — L292 Pruritus vulvae: Secondary | ICD-10-CM | POA: Diagnosis not present

## 2018-06-09 DIAGNOSIS — N898 Other specified noninflammatory disorders of vagina: Secondary | ICD-10-CM | POA: Diagnosis not present

## 2018-06-09 DIAGNOSIS — Z124 Encounter for screening for malignant neoplasm of cervix: Secondary | ICD-10-CM

## 2018-06-09 MED ORDER — TERCONAZOLE 0.4 % VA CREA: 1.0000 | TOPICAL_CREAM | Freq: Every day | VAGINAL | 0 refills | Status: DC

## 2018-06-09 NOTE — Patient Instructions (Signed)
Please take 1000 IU vitamin D3 daily

## 2018-06-11 LAB — CYTOLOGY - PAP: DIAGNOSIS: NEGATIVE

## 2018-06-21 ENCOUNTER — Encounter: Payer: Self-pay | Admitting: Certified Nurse Midwife

## 2018-06-21 LAB — POCT WET PREP (WET MOUNT): Trichomonas Wet Prep HPF POC: ABSENT

## 2018-07-20 NOTE — Discharge Instructions (Signed)
Instructions after Total Hip Replacement ° ° °  Mekaylah Klich P. Kayzlee Wirtanen, Jr., M.D.    ° Dept. of Orthopaedics & Sports Medicine ° [facility] ° [address] ° [place], [place]  [address] ° Phone: [phone]   Fax: [phone] ° °  °DIET: °• Drink plenty of non-alcoholic fluids. °• Resume your normal diet. Include foods high in fiber. ° °ACTIVITY:  °• You may use crutches or a walker with weight-bearing as tolerated, unless instructed otherwise. °• You may be weaned off of the walker or crutches by your Physical Therapist.  °• Do NOT reach below the level of your knees or cross your legs until allowed.    °• Continue doing gentle exercises. Exercising will reduce the pain and swelling, increase motion, and prevent muscle weakness.   °• Please continue to use the TED compression stockings for 6 weeks. You may remove the stockings at night, but should reapply them in the morning. °• Do not drive or operate any equipment until instructed. ° °WOUND CARE:  °• Continue to use ice packs periodically to reduce pain and swelling. °• Keep the incision clean and dry. °• You may bathe or shower after the staples are removed at the first office visit following surgery. ° °MEDICATIONS: °• You may resume your regular medications. °• Please take the pain medication as prescribed on the medication. °• Do not take pain medication on an empty stomach. °• You have been given a prescription for a blood thinner to prevent blood clots. Please take the medication as instructed. (NOTE: After completing a 2 week course of Lovenox, take one Enteric-coated aspirin once a day.) °• Pain medications and iron supplements can cause constipation. Use a stool softener (Senokot or Colace) on a daily basis and a laxative (dulcolax or miralax) as needed. °• Do not drive or drink alcoholic beverages when taking pain medications. ° °CALL THE OFFICE FOR: °• Temperature above 101 degrees °• Excessive bleeding or drainage on the dressing. °• Excessive  swelling, coldness, or paleness of the toes. °• Persistent nausea and vomiting. ° °FOLLOW-UP:  °• You should have an appointment to return to the office in 6 weeks after surgery. °• Arrangements have been made for continuation of Physical Therapy (either home therapy or outpatient therapy). °  °

## 2018-07-28 ENCOUNTER — Encounter
Admission: RE | Admit: 2018-07-28 | Discharge: 2018-07-28 | Disposition: A | Discharge status: Home / Self Care | Payer: BC Managed Care – PPO | Source: Ambulatory Visit | Attending: Orthopedic Surgery | Admitting: Orthopedic Surgery

## 2018-07-28 ENCOUNTER — Other Ambulatory Visit: Payer: Self-pay

## 2018-07-28 DIAGNOSIS — I10 Essential (primary) hypertension: Secondary | ICD-10-CM | POA: Insufficient documentation

## 2018-07-28 DIAGNOSIS — Z01818 Encounter for other preprocedural examination: Secondary | ICD-10-CM | POA: Insufficient documentation

## 2018-07-28 HISTORY — DX: Personal history of other diseases of the digestive system: Z87.19

## 2018-07-28 HISTORY — DX: Barrett's esophagus without dysplasia: K22.70

## 2018-07-28 LAB — COMPREHENSIVE METABOLIC PANEL
ALK PHOS: 72 U/L (ref 38–126)
ALT: 16 U/L (ref 0–44)
ANION GAP: 8 (ref 5–15)
AST: 16 U/L (ref 15–41)
Albumin: 4.1 g/dL (ref 3.5–5.0)
BUN: 16 mg/dL (ref 6–20)
CALCIUM: 9.1 mg/dL (ref 8.9–10.3)
CO2: 30 mmol/L (ref 22–32)
Chloride: 103 mmol/L (ref 98–111)
Creatinine, Ser: 0.66 mg/dL (ref 0.44–1.00)
GFR calc non Af Amer: >60 mL/min (ref >60)
Glucose, Bld: 104 mg/dL — ABNORMAL HIGH (ref 70–99)
Potassium: 3.2 mmol/L — ABNORMAL LOW (ref 3.5–5.1)
SODIUM: 141 mmol/L (ref 135–145)
TOTAL PROTEIN: 7.3 g/dL (ref 6.5–8.1)
Total Bilirubin: 1.4 mg/dL — ABNORMAL HIGH (ref 0.3–1.2)

## 2018-07-28 LAB — CBC
HCT: 45.3 % (ref 36.0–46.0)
Hemoglobin: 15.1 g/dL — ABNORMAL HIGH (ref 12.0–15.0)
MCH: 30.3 pg (ref 26.0–34.0)
MCHC: 33.3 g/dL (ref 30.0–36.0)
MCV: 91 fL (ref 80.0–100.0)
Platelets: 263 10*3/uL (ref 150–400)
RBC: 4.98 MIL/uL (ref 3.87–5.11)
RDW: 13 % (ref 11.5–15.5)
WBC: 4.8 10*3/uL (ref 4.0–10.5)
nRBC: 0 % (ref 0.0–0.2)

## 2018-07-28 LAB — URINALYSIS, ROUTINE W REFLEX MICROSCOPIC
BACTERIA UA: NONE SEEN
Bilirubin Urine: NEGATIVE
Glucose, UA: NEGATIVE mg/dL
Hgb urine dipstick: NEGATIVE
Ketones, ur: NEGATIVE mg/dL
NITRITE: NEGATIVE
PH: 6 (ref 5.0–8.0)
PROTEIN: NEGATIVE mg/dL
Specific Gravity, Urine: 1.018 (ref 1.005–1.030)
WBC, UA: >50 WBC/hpf — ABNORMAL HIGH (ref 0–5)

## 2018-07-28 LAB — PROTIME-INR
INR: 0.88
PROTHROMBIN TIME: 11.9 s (ref 11.4–15.2)

## 2018-07-28 LAB — C-REACTIVE PROTEIN: CRP: <0.8 mg/dL (ref <1.0)

## 2018-07-28 LAB — TYPE AND SCREEN
ABO/RH(D): A NEG
Antibody Screen: NEGATIVE

## 2018-07-28 LAB — APTT: APTT: 27 s (ref 24–36)

## 2018-07-28 LAB — SURGICAL PCR SCREEN
MRSA, PCR: NEGATIVE
STAPHYLOCOCCUS AUREUS: NEGATIVE

## 2018-07-28 LAB — SEDIMENTATION RATE: Sed Rate: 8 mm/hr (ref 0–30)

## 2018-07-28 NOTE — Patient Instructions (Signed)
Your procedure is scheduled on: Wednesday, August 11, 2018  Report to [facility]    DO NOT STOP ON THE FIRST FLOOR TO REGISTER  To find out your arrival time please call [phone] between 1PM - 3PM on Tuesday, August 10, 2018  Remember: Instructions that are not followed completely may result in serious medical risk,  up to and including death, or upon the discretion of your surgeon and anesthesiologist your  surgery may need to be rescheduled.     _X__ 1. Do not eat food after midnight the night before your procedure.                 No gum chewing or hard candies.                    ABSOLUTELY NOTHING SOLID IN YOUR MOUTH AFTER MIDNIGHT                  You may drink clear liquids up to 2 hours before you are scheduled to arrive for your surgery-                  DO not drink clear liquids within 2 hours of the start of your surgery.                  Clear Liquids include:  water, apple juice without pulp, clear carbohydrate                 drink such as Clearfast of Gatorade, Black Coffee or Tea (Do not add                 anything to coffee or tea).  __X__2.  On the morning of surgery brush your teeth with toothpaste and water,                   You may rinse your mouth with mouthwash if you wish.                       Do not swallow any toothpaste of mouthwash.     _X__ 3.  No Alcohol for 24 hours before or after surgery.   _X__ 4.  Do Not Smoke or use e-cigarettes For 24 Hours Prior to Your Surgery.                 Do not use any chewable tobacco products for at least 6 hours prior to                 surgery.  ____  5.  Bring all medications with you on the day of surgery if instructed.   ____  6.  Notify your doctor if there is any change in your medical condition      (cold, fever, infections).     Do not wear jewelry, make-up, hairpins, clips or nail polish. Do not wear lotions, powders, or perfumes. You may wear  deodorant. Do not shave 48 hours prior to surgery. Men may shave face and neck. Do not bring valuables to the hospital.    [facility] is not responsible for any belongings or valuables.  Contacts, dentures or bridgework may not be worn into surgery. Leave your suitcase in the car. After surgery it may be brought to your room. For patients admitted to the hospital, discharge time is determined by your treatment team.   Patients discharged the day  of surgery will not be allowed to drive home.   Please read over the following fact sheets that you were given:   PREPARING FOR SURGERY  __X__ Take these medicines the morning of surgery with A SIP OF WATER:    1. XANAX  2. TRAMADOL, IF NEED TO TAKE IT  3. PROTONIX, TAKE THE NIGHT BEFORE AND DAY OF SURGERY  4. SULCRAFATE  5. GABAPENTIN  6. FERROUS SULFATE  ____ Fleet Enema (as directed)   __X__ Use CHG Soap as directed  __X__ Stop ALL ASPIRIN PRODUCTS AS OF August 04, 2018   _X___ Stop Anti-inflammatories ON August 04, 2018              THIS INCLUDES IBUPROFEN / MOTRIN / ADVIL / ALEVE / CELEBREX   ____ Stop supplements until after surgery.    ____ Bring C-Pap to the hospital.   YOU MAY CONTINUE YOUR VITAMIN SUPPLEMENTS UP UNTIL THE DAY OF SURGERY  DO NOT HYDROCHLOROTHIAZIDE / ADDERAL ON THE DAY OF SURGERY.       YOU MAY TAKE THEM EVERY DAY UP UNTIL SURGERY  IF YOU COMPLETE MEDICAL DIRECTIVES / POA PAPERS THEN BRING WITH YOU TO      THE HOSPITAL SO WE CAN PUT INTO YOUR CHART  WEAR COMFORTABLE CLOTHES AND STEP IN SHOES OR SLIPPERS

## 2018-07-29 ENCOUNTER — Encounter: Payer: Self-pay | Admitting: *Deleted

## 2018-07-30 LAB — URINE CULTURE
Culture: 20000 — AB
SPECIAL REQUESTS: NORMAL

## 2018-08-11 ENCOUNTER — Encounter
Admission: RE | Disposition: A | Discharge status: Home / Self Care | Payer: Self-pay | Source: Home / Self Care | Attending: Orthopedic Surgery

## 2018-08-11 ENCOUNTER — Encounter: Payer: Self-pay | Admitting: Orthopedic Surgery

## 2018-08-11 ENCOUNTER — Other Ambulatory Visit: Payer: Self-pay | Admitting: Orthopedic Surgery

## 2018-08-11 ENCOUNTER — Inpatient Hospital Stay
Admission: RE | Admit: 2018-08-11 | Discharge: 2018-08-13 | DRG: 470 | Disposition: A | Discharge status: Home / Self Care | Payer: BC Managed Care – PPO | Attending: Orthopedic Surgery | Admitting: Orthopedic Surgery

## 2018-08-11 ENCOUNTER — Inpatient Hospital Stay: Payer: BC Managed Care – PPO

## 2018-08-11 ENCOUNTER — Inpatient Hospital Stay: Payer: BC Managed Care – PPO | Admitting: Anesthesiology

## 2018-08-11 ENCOUNTER — Ambulatory Visit
Admission: RE | Admit: 2018-08-11 | Discharge: 2018-08-11 | Disposition: A | Discharge status: Home / Self Care | Payer: Self-pay | Source: Ambulatory Visit | Attending: Orthopedic Surgery | Admitting: Orthopedic Surgery

## 2018-08-11 ENCOUNTER — Other Ambulatory Visit: Payer: Self-pay

## 2018-08-11 DIAGNOSIS — M1710 Unilateral primary osteoarthritis, unspecified knee: Secondary | ICD-10-CM | POA: Insufficient documentation

## 2018-08-11 DIAGNOSIS — E669 Obesity, unspecified: Secondary | ICD-10-CM | POA: Diagnosis present

## 2018-08-11 DIAGNOSIS — Z9109 Other allergy status, other than to drugs and biological substances: Secondary | ICD-10-CM | POA: Insufficient documentation

## 2018-08-11 DIAGNOSIS — K635 Polyp of colon: Secondary | ICD-10-CM | POA: Diagnosis present

## 2018-08-11 DIAGNOSIS — Z8719 Personal history of other diseases of the digestive system: Secondary | ICD-10-CM | POA: Diagnosis not present

## 2018-08-11 DIAGNOSIS — I10 Essential (primary) hypertension: Secondary | ICD-10-CM | POA: Diagnosis present

## 2018-08-11 DIAGNOSIS — Z6834 Body mass index (BMI) 34.0-34.9, adult: Secondary | ICD-10-CM | POA: Diagnosis not present

## 2018-08-11 DIAGNOSIS — M1611 Unilateral primary osteoarthritis, right hip: Principal | ICD-10-CM | POA: Diagnosis present

## 2018-08-11 DIAGNOSIS — S72001A Fracture of unspecified part of neck of right femur, initial encounter for closed fracture: Secondary | ICD-10-CM

## 2018-08-11 DIAGNOSIS — D126 Benign neoplasm of colon, unspecified: Secondary | ICD-10-CM | POA: Insufficient documentation

## 2018-08-11 DIAGNOSIS — E785 Hyperlipidemia, unspecified: Secondary | ICD-10-CM | POA: Diagnosis present

## 2018-08-11 DIAGNOSIS — D509 Iron deficiency anemia, unspecified: Secondary | ICD-10-CM | POA: Diagnosis present

## 2018-08-11 DIAGNOSIS — G2581 Restless legs syndrome: Secondary | ICD-10-CM | POA: Diagnosis present

## 2018-08-11 DIAGNOSIS — J449 Chronic obstructive pulmonary disease, unspecified: Secondary | ICD-10-CM | POA: Diagnosis present

## 2018-08-11 DIAGNOSIS — E538 Deficiency of other specified B group vitamins: Secondary | ICD-10-CM | POA: Insufficient documentation

## 2018-08-11 DIAGNOSIS — R0609 Other forms of dyspnea: Secondary | ICD-10-CM

## 2018-08-11 DIAGNOSIS — Z96649 Presence of unspecified artificial hip joint: Secondary | ICD-10-CM

## 2018-08-11 DIAGNOSIS — M6740 Ganglion, unspecified site: Secondary | ICD-10-CM | POA: Insufficient documentation

## 2018-08-11 DIAGNOSIS — D519 Vitamin B12 deficiency anemia, unspecified: Secondary | ICD-10-CM | POA: Diagnosis present

## 2018-08-11 DIAGNOSIS — K449 Diaphragmatic hernia without obstruction or gangrene: Secondary | ICD-10-CM | POA: Insufficient documentation

## 2018-08-11 DIAGNOSIS — K2270 Barrett's esophagus without dysplasia: Secondary | ICD-10-CM | POA: Diagnosis present

## 2018-08-11 DIAGNOSIS — K5790 Diverticulosis of intestine, part unspecified, without perforation or abscess without bleeding: Secondary | ICD-10-CM | POA: Insufficient documentation

## 2018-08-11 DIAGNOSIS — M19049 Primary osteoarthritis, unspecified hand: Secondary | ICD-10-CM | POA: Insufficient documentation

## 2018-08-11 DIAGNOSIS — M179 Osteoarthritis of knee, unspecified: Secondary | ICD-10-CM | POA: Insufficient documentation

## 2018-08-11 HISTORY — PX: TOTAL HIP ARTHROPLASTY: SHX124

## 2018-08-11 HISTORY — DX: Restless legs syndrome: G25.81

## 2018-08-11 LAB — CBC
HCT: 40.3 % (ref 36.0–46.0)
Hemoglobin: 13.5 g/dL (ref 12.0–15.0)
MCH: 30.8 pg (ref 26.0–34.0)
MCHC: 33.5 g/dL (ref 30.0–36.0)
MCV: 91.8 fL (ref 80.0–100.0)
Platelets: 217 10*3/uL (ref 150–400)
RBC: 4.39 MIL/uL (ref 3.87–5.11)
RDW: 13.4 % (ref 11.5–15.5)
WBC: 10.3 10*3/uL (ref 4.0–10.5)
nRBC: 0 % (ref 0.0–0.2)

## 2018-08-11 LAB — POCT I-STAT 4, (NA,K, GLUC, HGB,HCT)
Glucose, Bld: 106 mg/dL — ABNORMAL HIGH (ref 70–99)
HCT: 42 % (ref 36.0–46.0)
HEMOGLOBIN: 14.3 g/dL (ref 12.0–15.0)
Potassium: 3.9 mmol/L (ref 3.5–5.1)
Sodium: 139 mmol/L (ref 135–145)

## 2018-08-11 LAB — CREATININE, SERUM
Creatinine, Ser: 0.73 mg/dL (ref 0.44–1.00)
GFR calc Af Amer: >60 mL/min (ref >60)
GFR calc non Af Amer: >60 mL/min (ref >60)

## 2018-08-11 LAB — ABO/RH: ABO/RH(D): A NEG

## 2018-08-11 SURGERY — ARTHROPLASTY, HIP, TOTAL,POSTERIOR APPROACH
Anesthesia: Spinal | Laterality: Right

## 2018-08-11 MED ORDER — PROPOFOL 500 MG/50ML IV EMUL
INTRAVENOUS | Status: DC | PRN
  Administered 2018-08-11: 80 ug/kg/min via INTRAVENOUS

## 2018-08-11 MED ORDER — SENNOSIDES-DOCUSATE SODIUM 8.6-50 MG PO TABS
1.0000 | ORAL_TABLET | Freq: Two times a day (BID) | ORAL | Status: DC
  Administered 2018-08-11 – 2018-08-12 (×3): 1 via ORAL
  Filled 2018-08-11 (×3): qty 1

## 2018-08-11 MED ORDER — PROPOFOL 10 MG/ML IV BOLUS
INTRAVENOUS | Status: DC | PRN
  Administered 2018-08-11: 40 mg via INTRAVENOUS

## 2018-08-11 MED ORDER — VITAMIN C 500 MG PO TABS
500.0000 mg | ORAL_TABLET | Freq: Every day | ORAL | Status: DC
  Administered 2018-08-12 – 2018-08-13 (×2): 500 mg via ORAL
  Filled 2018-08-11 (×2): qty 1

## 2018-08-11 MED ORDER — BUPIVACAINE HCL (PF) 0.5 % IJ SOLN
INTRAMUSCULAR | Status: AC
  Filled 2018-08-11: qty 10

## 2018-08-11 MED ORDER — ONDANSETRON HCL 4 MG/2ML IJ SOLN: 4.0000 mg | Freq: Four times a day (QID) | INTRAMUSCULAR | Status: DC | PRN

## 2018-08-11 MED ORDER — GABAPENTIN 100 MG PO CAPS
100.0000 mg | ORAL_CAPSULE | Freq: Three times a day (TID) | ORAL | Status: DC
  Administered 2018-08-11 – 2018-08-13 (×5): 100 mg via ORAL
  Filled 2018-08-11 (×5): qty 1

## 2018-08-11 MED ORDER — SODIUM CHLORIDE 0.9 % IV SOLN
INTRAVENOUS | Status: DC | PRN
  Administered 2018-08-11: 6 mL

## 2018-08-11 MED ORDER — ALUM & MAG HYDROXIDE-SIMETH 200-200-20 MG/5ML PO SUSP
30.0000 mL | ORAL | Status: DC | PRN
  Filled 2018-08-11: qty 30

## 2018-08-11 MED ORDER — ALPRAZOLAM 0.5 MG PO TABS
0.5000 mg | ORAL_TABLET | Freq: Every day | ORAL | Status: DC
  Administered 2018-08-12: 0.5 mg via ORAL
  Filled 2018-08-11: qty 1

## 2018-08-11 MED ORDER — CELECOXIB 200 MG PO CAPS
400.0000 mg | ORAL_CAPSULE | Freq: Once | ORAL | Status: AC
  Administered 2018-08-11: 400 mg via ORAL

## 2018-08-11 MED ORDER — TRAMADOL HCL 50 MG PO TABS
50.0000 mg | ORAL_TABLET | ORAL | Status: DC | PRN
  Administered 2018-08-11: 50 mg via ORAL
  Administered 2018-08-12 – 2018-08-13 (×3): 100 mg via ORAL
  Filled 2018-08-11 (×4): qty 2

## 2018-08-11 MED ORDER — DIPHENHYDRAMINE HCL 12.5 MG/5ML PO ELIX: 12.5000 mg | ORAL_SOLUTION | ORAL | Status: DC | PRN

## 2018-08-11 MED ORDER — FENTANYL CITRATE (PF) 100 MCG/2ML IJ SOLN
INTRAMUSCULAR | Status: DC | PRN
  Administered 2018-08-11 (×2): 50 ug via INTRAVENOUS

## 2018-08-11 MED ORDER — SUCRALFATE 1 G PO TABS
1.0000 g | ORAL_TABLET | Freq: Two times a day (BID) | ORAL | Status: DC
  Administered 2018-08-11 – 2018-08-13 (×4): 1 g via ORAL
  Filled 2018-08-11 (×4): qty 1

## 2018-08-11 MED ORDER — GABAPENTIN 300 MG PO CAPS
ORAL_CAPSULE | ORAL | Status: AC
  Filled 2018-08-11: qty 1

## 2018-08-11 MED ORDER — SODIUM CHLORIDE 0.9 % IV SOLN
INTRAVENOUS | Status: DC
  Administered 2018-08-11: 100 mL via INTRAVENOUS
  Administered 2018-08-12: 05:00:00 via INTRAVENOUS

## 2018-08-11 MED ORDER — CEFAZOLIN SODIUM-DEXTROSE 2-4 GM/100ML-% IV SOLN
2.0000 g | INTRAVENOUS | Status: AC
  Administered 2018-08-11: 2 g via INTRAVENOUS

## 2018-08-11 MED ORDER — METOCLOPRAMIDE HCL 10 MG PO TABS
10.0000 mg | ORAL_TABLET | Freq: Three times a day (TID) | ORAL | Status: DC
  Administered 2018-08-11 – 2018-08-13 (×7): 10 mg via ORAL
  Filled 2018-08-11 (×7): qty 1

## 2018-08-11 MED ORDER — HYDROMORPHONE HCL 1 MG/ML IJ SOLN
0.2500 mg | INTRAMUSCULAR | Status: DC | PRN
  Administered 2018-08-11 (×4): 0.25 mg via INTRAVENOUS

## 2018-08-11 MED ORDER — VITAMIN D 25 MCG (1000 UNIT) PO TABS: 1000.0000 [IU] | ORAL_TABLET | ORAL | Status: DC

## 2018-08-11 MED ORDER — DEXAMETHASONE SODIUM PHOSPHATE 10 MG/ML IJ SOLN
INTRAMUSCULAR | Status: AC
  Administered 2018-08-11: 8 mg via INTRAVENOUS
  Filled 2018-08-11: qty 1

## 2018-08-11 MED ORDER — CHLORHEXIDINE GLUCONATE 4 % EX LIQD: 60.0000 mL | Freq: Once | CUTANEOUS | Status: DC

## 2018-08-11 MED ORDER — CEFAZOLIN SODIUM-DEXTROSE 2-4 GM/100ML-% IV SOLN
INTRAVENOUS | Status: AC
  Filled 2018-08-11: qty 100

## 2018-08-11 MED ORDER — OXYCODONE HCL 5 MG PO TABS
5.0000 mg | ORAL_TABLET | ORAL | Status: DC | PRN
  Administered 2018-08-12 – 2018-08-13 (×3): 5 mg via ORAL
  Filled 2018-08-11 (×4): qty 1

## 2018-08-11 MED ORDER — BUPIVACAINE HCL (PF) 0.5 % IJ SOLN
INTRAMUSCULAR | Status: DC | PRN
  Administered 2018-08-11: 3 mL

## 2018-08-11 MED ORDER — POLYETHYLENE GLYCOL 3350 17 G PO PACK
17.0000 g | PACK | Freq: Every day | ORAL | Status: DC
  Administered 2018-08-11 – 2018-08-12 (×2): 17 g via ORAL
  Filled 2018-08-11 (×2): qty 1

## 2018-08-11 MED ORDER — PANTOPRAZOLE SODIUM 40 MG PO TBEC
40.0000 mg | DELAYED_RELEASE_TABLET | Freq: Two times a day (BID) | ORAL | Status: DC
  Administered 2018-08-11 – 2018-08-13 (×4): 40 mg via ORAL
  Filled 2018-08-11 (×4): qty 1

## 2018-08-11 MED ORDER — ACETAMINOPHEN 10 MG/ML IV SOLN
INTRAVENOUS | Status: AC
  Filled 2018-08-11: qty 100

## 2018-08-11 MED ORDER — GABAPENTIN 100 MG PO CAPS
200.0000 mg | ORAL_CAPSULE | Freq: Once | ORAL | Status: AC
  Administered 2018-08-11: 200 mg via ORAL
  Filled 2018-08-11: qty 2

## 2018-08-11 MED ORDER — ACETAMINOPHEN 10 MG/ML IV SOLN
INTRAVENOUS | Status: DC | PRN
  Administered 2018-08-11: 1000 mg via INTRAVENOUS

## 2018-08-11 MED ORDER — CLOBETASOL PROPIONATE 0.05 % EX CREA
1.0000 "application " | TOPICAL_CREAM | Freq: Every day | CUTANEOUS | Status: DC | PRN
  Filled 2018-08-11: qty 15

## 2018-08-11 MED ORDER — FENTANYL CITRATE (PF) 100 MCG/2ML IJ SOLN
INTRAMUSCULAR | Status: AC
  Filled 2018-08-11: qty 2

## 2018-08-11 MED ORDER — GABAPENTIN 300 MG PO CAPS: 300.0000 mg | ORAL_CAPSULE | Freq: Once | ORAL | Status: DC

## 2018-08-11 MED ORDER — LIDOCAINE HCL (PF) 2 % IJ SOLN
INTRAMUSCULAR | Status: AC
  Filled 2018-08-11: qty 10

## 2018-08-11 MED ORDER — ACETAMINOPHEN 325 MG PO TABS: 325.0000 mg | ORAL_TABLET | Freq: Four times a day (QID) | ORAL | Status: DC | PRN

## 2018-08-11 MED ORDER — FENTANYL CITRATE (PF) 100 MCG/2ML IJ SOLN
25.0000 ug | INTRAMUSCULAR | Status: AC | PRN
  Administered 2018-08-11 (×6): 25 ug via INTRAVENOUS

## 2018-08-11 MED ORDER — METOCLOPRAMIDE HCL 5 MG/ML IJ SOLN: 5.0000 mg | Freq: Three times a day (TID) | INTRAMUSCULAR | Status: DC | PRN

## 2018-08-11 MED ORDER — HYDROCHLOROTHIAZIDE 25 MG PO TABS: 25.0000 mg | ORAL_TABLET | Freq: Every day | ORAL | Status: DC

## 2018-08-11 MED ORDER — TRANEXAMIC ACID 1000 MG/10ML IV SOLN
1000.0000 mg | Freq: Once | INTRAVENOUS | Status: AC
  Administered 2018-08-11: 1000 mg via INTRAVENOUS
  Filled 2018-08-11 (×2): qty 10

## 2018-08-11 MED ORDER — FLEET ENEMA 7-19 GM/118ML RE ENEM: 1.0000 | ENEMA | Freq: Once | RECTAL | Status: DC | PRN

## 2018-08-11 MED ORDER — SODIUM CHLORIDE 0.9 % IV SOLN
INTRAVENOUS | Status: DC | PRN
  Administered 2018-08-11: 40 ug/min via INTRAVENOUS
  Administered 2018-08-11: 30 ug/min via INTRAVENOUS

## 2018-08-11 MED ORDER — PHENOL 1.4 % MT LIQD
1.0000 | OROMUCOSAL | Status: DC | PRN
  Filled 2018-08-11: qty 177

## 2018-08-11 MED ORDER — OXYCODONE HCL 5 MG PO TABS: 10.0000 mg | ORAL_TABLET | ORAL | Status: DC | PRN

## 2018-08-11 MED ORDER — CEFAZOLIN SODIUM-DEXTROSE 2-4 GM/100ML-% IV SOLN
2.0000 g | Freq: Four times a day (QID) | INTRAVENOUS | Status: AC
  Administered 2018-08-11 – 2018-08-12 (×4): 2 g via INTRAVENOUS
  Filled 2018-08-11 (×4): qty 100

## 2018-08-11 MED ORDER — FERROUS SULFATE 325 (65 FE) MG PO TABS
325.0000 mg | ORAL_TABLET | Freq: Two times a day (BID) | ORAL | Status: DC
  Administered 2018-08-12 – 2018-08-13 (×2): 325 mg via ORAL
  Filled 2018-08-11 (×2): qty 1

## 2018-08-11 MED ORDER — GLYCOPYRROLATE 0.2 MG/ML IJ SOLN
INTRAMUSCULAR | Status: DC | PRN
  Administered 2018-08-11: 0.2 mg via INTRAVENOUS

## 2018-08-11 MED ORDER — CELECOXIB 200 MG PO CAPS
ORAL_CAPSULE | ORAL | Status: AC
  Administered 2018-08-11: 400 mg via ORAL
  Filled 2018-08-11: qty 2

## 2018-08-11 MED ORDER — ENOXAPARIN SODIUM 40 MG/0.4ML ~~LOC~~ SOLN
40.0000 mg | SUBCUTANEOUS | Status: DC
  Administered 2018-08-12 – 2018-08-13 (×2): 40 mg via SUBCUTANEOUS
  Filled 2018-08-11 (×2): qty 0.4

## 2018-08-11 MED ORDER — PRAVASTATIN SODIUM 20 MG PO TABS
40.0000 mg | ORAL_TABLET | Freq: Every day | ORAL | Status: DC
  Administered 2018-08-12: 40 mg via ORAL
  Filled 2018-08-11: qty 2

## 2018-08-11 MED ORDER — GLYCOPYRROLATE 0.2 MG/ML IJ SOLN
INTRAMUSCULAR | Status: AC
  Filled 2018-08-11: qty 1

## 2018-08-11 MED ORDER — BISACODYL 10 MG RE SUPP: 10.0000 mg | Freq: Every day | RECTAL | Status: DC | PRN

## 2018-08-11 MED ORDER — ONDANSETRON HCL 4 MG/2ML IJ SOLN: 4.0000 mg | Freq: Once | INTRAMUSCULAR | Status: DC | PRN

## 2018-08-11 MED ORDER — AMPHETAMINE-DEXTROAMPHETAMINE 5 MG PO TABS
30.0000 mg | ORAL_TABLET | Freq: Two times a day (BID) | ORAL | Status: DC
  Filled 2018-08-11 (×2): qty 6

## 2018-08-11 MED ORDER — MIDAZOLAM HCL 2 MG/2ML IJ SOLN
INTRAMUSCULAR | Status: AC
  Filled 2018-08-11: qty 2

## 2018-08-11 MED ORDER — HYDROMORPHONE HCL 1 MG/ML IJ SOLN: 0.5000 mg | INTRAMUSCULAR | Status: DC | PRN

## 2018-08-11 MED ORDER — CELECOXIB 200 MG PO CAPS
200.0000 mg | ORAL_CAPSULE | Freq: Two times a day (BID) | ORAL | Status: DC
  Administered 2018-08-11 – 2018-08-13 (×4): 200 mg via ORAL
  Filled 2018-08-11 (×4): qty 1

## 2018-08-11 MED ORDER — PROPOFOL 500 MG/50ML IV EMUL
INTRAVENOUS | Status: AC
  Filled 2018-08-11: qty 50

## 2018-08-11 MED ORDER — HYDROMORPHONE HCL 1 MG/ML IJ SOLN
INTRAMUSCULAR | Status: AC
  Filled 2018-08-11: qty 1

## 2018-08-11 MED ORDER — DEXAMETHASONE SODIUM PHOSPHATE 10 MG/ML IJ SOLN
8.0000 mg | Freq: Once | INTRAMUSCULAR | Status: AC
  Administered 2018-08-11: 8 mg via INTRAVENOUS

## 2018-08-11 MED ORDER — LACTATED RINGERS IV SOLN
INTRAVENOUS | Status: DC
  Administered 2018-08-11 (×2): via INTRAVENOUS

## 2018-08-11 MED ORDER — METOCLOPRAMIDE HCL 10 MG PO TABS: 5.0000 mg | ORAL_TABLET | Freq: Three times a day (TID) | ORAL | Status: DC | PRN

## 2018-08-11 MED ORDER — TRANEXAMIC ACID-NACL 1000-0.7 MG/100ML-% IV SOLN
1000.0000 mg | INTRAVENOUS | Status: AC
  Administered 2018-08-11: 1000 mg via INTRAVENOUS
  Filled 2018-08-11: qty 100

## 2018-08-11 MED ORDER — MAGNESIUM HYDROXIDE 400 MG/5ML PO SUSP
30.0000 mL | Freq: Every day | ORAL | Status: DC
  Administered 2018-08-12: 30 mL via ORAL
  Filled 2018-08-11: qty 30

## 2018-08-11 MED ORDER — MENTHOL 3 MG MT LOZG
1.0000 | LOZENGE | OROMUCOSAL | Status: DC | PRN
  Filled 2018-08-11: qty 9

## 2018-08-11 MED ORDER — MIDAZOLAM HCL 5 MG/5ML IJ SOLN
INTRAMUSCULAR | Status: DC | PRN
  Administered 2018-08-11: 2 mg via INTRAVENOUS

## 2018-08-11 MED ORDER — ONDANSETRON HCL 4 MG PO TABS: 4.0000 mg | ORAL_TABLET | Freq: Four times a day (QID) | ORAL | Status: DC | PRN

## 2018-08-11 MED ORDER — ACETAMINOPHEN 10 MG/ML IV SOLN
1000.0000 mg | Freq: Four times a day (QID) | INTRAVENOUS | Status: AC
  Administered 2018-08-11 – 2018-08-12 (×4): 1000 mg via INTRAVENOUS
  Filled 2018-08-11 (×4): qty 100

## 2018-08-11 SURGICAL SUPPLY — 60 items
BLADE DRUM FLTD (BLADE) ×3 IMPLANT
BLADE SAW 90X25X1.19 OSCILLAT (BLADE) ×3 IMPLANT
CANISTER SUCT 1200ML W/VALVE (MISCELLANEOUS) ×1 IMPLANT
CANISTER SUCT 3000ML PPV (MISCELLANEOUS) ×4 IMPLANT
CARTRIDGE OIL MAESTRO DRILL (MISCELLANEOUS) ×1 IMPLANT
COVER WAND RF STERILE (DRAPES) ×1 IMPLANT
DIFFUSER DRILL AIR PNEUMATIC (MISCELLANEOUS) ×3 IMPLANT
DRAPE INCISE IOBAN 66X60 STRL (DRAPES) ×3 IMPLANT
DRAPE SHEET LG 3/4 BI-LAMINATE (DRAPES) ×3 IMPLANT
DRSG DERMACEA 8X12 NADH (GAUZE/BANDAGES/DRESSINGS) ×1 IMPLANT
DRSG OPSITE POSTOP 4X12 (GAUZE/BANDAGES/DRESSINGS) ×3 IMPLANT
DRSG OPSITE POSTOP 4X14 (GAUZE/BANDAGES/DRESSINGS) ×2 IMPLANT
DRSG TEGADERM 4X4.75 (GAUZE/BANDAGES/DRESSINGS) ×5 IMPLANT
DURAPREP 26ML APPLICATOR (WOUND CARE) ×3 IMPLANT
ELECT BLADE 6.5 EXT (BLADE) ×1 IMPLANT
ELECT CAUTERY BLADE 6.4 (BLADE) ×3 IMPLANT
GLOVE BIOGEL M STRL SZ7.5 (GLOVE) ×6 IMPLANT
GLOVE BIOGEL PI IND STRL 9 (GLOVE) ×1 IMPLANT
GLOVE BIOGEL PI INDICATOR 9 (GLOVE) ×6
GLOVE INDICATOR 8.0 STRL GRN (GLOVE) ×9 IMPLANT
GLOVE SURG SYN 9.0  PF PI (GLOVE) ×10
GLOVE SURG SYN 9.0 PF PI (GLOVE) ×1 IMPLANT
GOWN STRL REUS W/ TWL LRG LVL3 (GOWN DISPOSABLE) ×2 IMPLANT
GOWN STRL REUS W/TWL 2XL LVL3 (GOWN DISPOSABLE) ×3 IMPLANT
GOWN STRL REUS W/TWL LRG LVL3 (GOWN DISPOSABLE) ×12
HEAD FEM STD 32X+5 STRL (Hips) ×2 IMPLANT
HEMOVAC 400CC 10FR (MISCELLANEOUS) ×3 IMPLANT
HOLDER FOLEY CATH W/STRAP (MISCELLANEOUS) ×3 IMPLANT
HOOD PEEL AWAY FLYTE STAYCOOL (MISCELLANEOUS) ×6 IMPLANT
KIT TURNOVER KIT A (KITS) ×3 IMPLANT
LINER MARATHON 10 DEG 32MMX450 (Hips) ×3 IMPLANT
LINER MARATHON 10D 32MMX450 (Hips) IMPLANT
NDL SAFETY ECLIPSE 18X1.5 (NEEDLE) ×1 IMPLANT
NEEDLE HYPO 18GX1.5 SHARP (NEEDLE) ×3
NS IRRIG 500ML POUR BTL (IV SOLUTION) ×3 IMPLANT
OIL CARTRIDGE MAESTRO DRILL (MISCELLANEOUS) ×3
PACK HIP PROSTHESIS (MISCELLANEOUS) ×3 IMPLANT
PENCIL SMOKE ULTRAEVAC 22 CON (MISCELLANEOUS) ×3 IMPLANT
PIN SECT CUP 50MM (Hips) ×2 IMPLANT
PIN STEIN THRED 5/32 (Pin) ×3 IMPLANT
PULSAVAC PLUS IRRIG FAN TIP (DISPOSABLE) ×3
SOL .9 NS 3000ML IRR  AL (IV SOLUTION) ×2
SOL .9 NS 3000ML IRR AL (IV SOLUTION) ×1
SOL .9 NS 3000ML IRR UROMATIC (IV SOLUTION) ×1 IMPLANT
SOL PREP PVP 2OZ (MISCELLANEOUS) ×3
SOLUTION PREP PVP 2OZ (MISCELLANEOUS) ×1 IMPLANT
SPONGE DRAIN TRACH 4X4 STRL 2S (GAUZE/BANDAGES/DRESSINGS) ×3 IMPLANT
STAPLER SKIN PROX 35W (STAPLE) ×3 IMPLANT
STEM FEM CMNTLSS SM AML 13.5 (Hips) ×2 IMPLANT
SUT ETHIBOND #5 BRAIDED 30INL (SUTURE) ×3 IMPLANT
SUT VIC AB 0 CT1 36 (SUTURE) ×3 IMPLANT
SUT VIC AB 1 CT1 36 (SUTURE) ×6 IMPLANT
SUT VIC AB 2-0 CT1 27 (SUTURE) ×3
SUT VIC AB 2-0 CT1 TAPERPNT 27 (SUTURE) ×1 IMPLANT
SYR 20CC LL (SYRINGE) ×3 IMPLANT
TAPE CLOTH 3X10 WHT NS LF (GAUZE/BANDAGES/DRESSINGS) ×3 IMPLANT
TAPE TRANSPORE STRL 2 31045 (GAUZE/BANDAGES/DRESSINGS) ×3 IMPLANT
TIP FAN IRRIG PULSAVAC PLUS (DISPOSABLE) ×1 IMPLANT
TOWEL OR 17X26 4PK STRL BLUE (TOWEL DISPOSABLE) ×3 IMPLANT
TRAY FOLEY MTR SLVR 16FR STAT (SET/KITS/TRAYS/PACK) ×3 IMPLANT

## 2018-08-11 NOTE — Anesthesia Procedure Notes (Signed)
Spinal  Patient location during procedure: OR Start time: 08/11/2018 10:45 AM End time: 08/11/2018 10:49 AM Staffing Resident/CRNA: Bernardo Heater, CRNA Performed: resident/CRNA  Preanesthetic Checklist Completed: patient identified, site marked, surgical consent, pre-op evaluation, timeout performed, IV checked, risks and benefits discussed and monitors and equipment checked Spinal Block Patient position: sitting Prep: ChloraPrep Patient monitoring: heart rate, continuous pulse ox, blood pressure and cardiac monitor Approach: midline Location: L3-4 Injection technique: single-shot Needle Needle type: Introducer and Pencan  Needle gauge: 24 G Needle length: 9 cm Additional Notes Negative paresthesia. Negative blood return. Positive free-flowing CSF. Expiration date of kit checked and confirmed. Patient tolerated procedure well, without complications.

## 2018-08-11 NOTE — Op Note (Signed)
OPERATIVE NOTE  DATE OF SURGERY:  08/11/2018  PATIENT NAME:  SYBILLA MALHOTRA   DOB: 08-26-1956  MRN: [id number]  PRE-OPERATIVE DIAGNOSIS: Degenerative arthrosis of the right hip, primary  POST-OPERATIVE DIAGNOSIS:  Same  PROCEDURE:  Right total hip arthroplasty  SURGEON:  Marciano Sequin. M.D.  ASSISTANT:  Vance Peper, PA (present and scrubbed throughout the case, critical for assistance with exposure, retraction, instrumentation, and closure)  ANESTHESIA: spinal  ESTIMATED BLOOD LOSS: 150 mL  FLUIDS REPLACED: 1500 mL of crystalloid  DRAINS: 2 medium drains to a Hemovac reservoir  IMPLANTS UTILIZED: DePuy 13.5 mm small stature AML femoral stem, 50 mm OD Pinnacle 100 acetabular component, +4 mm 10 degree Pinnacle Marathon polyethylene insert, and a 32 mm CoCr +5 mm hip ball  INDICATIONS FOR SURGERY: MILLER EDGINGTON is a 62 y.o. year old female with a long history of progressive hip and groin  pain. X-rays demonstrated severe degenerative changes. The patient had not seen any significant improvement despite conservative nonsurgical intervention. After discussion of the risks and benefits of surgical intervention, the patient expressed understanding of the risks benefits and agree with plans for total hip arthroplasty.   The risks, benefits, and alternatives were discussed at length including but not limited to the risks of infection, bleeding, nerve injury, stiffness, blood clots, the need for revision surgery, limb length inequality, dislocation, cardiopulmonary complications, among others, and they were willing to proceed.  PROCEDURE IN DETAIL: The patient was brought into the operating room and, after adequate spinal anesthesia was achieved, the patient was placed in a left lateral decubitus position. Axillary roll was placed and all bony prominences were well-padded. The patient's right hip was cleaned and prepped with alcohol and DuraPrep and draped in the usual sterile fashion. A  "timeout" was performed as per usual protocol. A lateral curvilinear incision was made gently curving towards the posterior superior iliac spine. The IT band was incised in line with the skin incision and the fibers of the gluteus maximus were split in line. The piriformis tendon was identified, skeletonized, and incised at its insertion to the proximal femur and reflected posteriorly. A T type posterior capsulotomy was performed. Prior to dislocation of the femoral head, a threaded Steinmann pin was inserted through a separate stab incision into the pelvis superior to the acetabulum and bent in the form of a stylus so as to assess limb length and hip offset throughout the procedure. The femoral head was then dislocated posteriorly. Inspection of the femoral head demonstrated severe degenerative changes with full-thickness loss of articular cartilage. The femoral neck cut was performed using an oscillating saw. The anterior capsule was elevated off of the femoral neck using a periosteal elevator. Attention was then directed to the acetabulum. The remnant of the labrum was excised using electrocautery. Inspection of the acetabulum also demonstrated significant degenerative changes. The acetabulum was reamed in sequential fashion up to a 49 mm diameter. Good punctate bleeding bone was encountered. A 50 mm Pinnacle 100 acetabular component was positioned and impacted into place. Good scratch fit was appreciated. A +4 mm neutral polyethylene trial was inserted.  Attention was then directed to the proximal femur. A hole for reaming of the proximal femoral canal was created using a high-speed burr. The femoral canal was reamed in sequential fashion up to a 13 mm diameter. This allowed for approximately 6.5 cm of scratch fit. Serial broaches were inserted up to a 13.5 mm small stature femoral broach. Calcar region was planed and  a trial reduction was performed using a 32 mm hip ball with a +5 mm neck length.  Reasonably  good stability was noted but it was elected to trial a +4 mm 10 degree liner with a high side at the 8 o'clock position.  Good equalization of limb lengths and hip offset was appreciated and excellent stability was noted both anteriorly and posteriorly. Trial components were removed. The acetabular shell was irrigated with copious amounts of normal saline with antibiotic solution and suctioned dry. A +4 mm 10 degree Pinnacle Marathon polyethylene insert was positioned and impacted into place with the high side at the 8 o'clock position. Next, a 13.5 mm small stature AML femoral stem was positioned and impacted into place. Excellent scratch fit was appreciated. A trial reduction was again performed with a 32 mm hip ball with a +5 mm neck length. Again, good equalization of limb lengths was appreciated and excellent stability appreciated both anteriorly and posteriorly. The hip was then dislocated and the trial hip ball was removed. The Morse taper was cleaned and dried. A 32 mm cobalt chrome hip ball with a +5 mm neck length was placed on the trunnion and impacted into place. The hip was then reduced and placed through range of motion. Excellent stability was appreciated both anteriorly and posteriorly.  The wound was irrigated with copious amounts of normal saline with antibiotic solution and suctioned dry. Good hemostasis was appreciated. The posterior capsulotomy was repaired using #5 Ethibond. Piriformis tendon was reapproximated to the undersurface of the gluteus medius tendon using #5 Ethibond. Two medium drains were placed in the wound bed and brought out through separate stab incisions to be attached to a Hemovac reservoir. The IT band was reapproximated using interrupted sutures of #1 Vicryl. Subcutaneous tissue was approximated using first #0 Vicryl followed by #2-0 Vicryl. The skin was closed with skin staples.  The patient tolerated the procedure well and was transported to the recovery room in stable  condition.   Marciano Sequin., M.D.

## 2018-08-11 NOTE — Anesthesia Post-op Follow-up Note (Signed)
Anesthesia QCDR form completed.        

## 2018-08-11 NOTE — H&P (Signed)
The patient has been re-examined, and the chart reviewed, and there have been no interval changes to the documented history and physical.    The risks, benefits, and alternatives have been discussed at length. The patient expressed understanding of the risks benefits and agreed with plans for surgical intervention.  Mackenzie [name], Jr. M.D.    

## 2018-08-11 NOTE — Anesthesia Preprocedure Evaluation (Signed)
Anesthesia Evaluation  Patient identified by MRN, date of birth, ID band Patient awake    Reviewed: Allergy & Precautions, NPO status , Patient's Chart, lab work & pertinent test results, reviewed documented beta blocker date and time   Airway Mallampati: III  TM Distance: >3 FB     Dental  (+) Chipped   Pulmonary COPD, former smoker,           Cardiovascular hypertension, Pt. on medications      Neuro/Psych PSYCHIATRIC DISORDERS Anxiety    GI/Hepatic Neg liver ROS, hiatal hernia, GERD  ,  Endo/Other  negative endocrine ROS  Renal/GU negative Renal ROS     Musculoskeletal  (+) Arthritis ,   Abdominal   Peds negative pediatric ROS (+)  Hematology  (+) anemia ,   Anesthesia Other Findings Past Medical History: No date: ADD (attention deficit disorder) No date: Anemia     Comment:  iron deficiency, b12 No date: Anxiety 2019: Barrett's esophagus No date: Colon polyps No date: COPD (chronic obstructive pulmonary disease) (HCC) No date: Essential hypertension No date: GERD (gastroesophageal reflux disease) No date: History of hiatal hernia No date: Hyperlipidemia No date: Lichen sclerosus No date: Osteoarthritis 2019: Other abnormal auditory perceptions, bilateral     Comment:  hip and back issues No date: RLS (restless legs syndrome) No date: Vitamin B12 deficiency  Reproductive/Obstetrics                             Anesthesia Physical  Anesthesia Plan  ASA: III  Anesthesia Plan: Spinal   Post-op Pain Management:    Induction: Intravenous  PONV Risk Score and Plan:   Airway Management Planned: Nasal Cannula  Additional Equipment:   Intra-op Plan:   Post-operative Plan:   Informed Consent: I have reviewed the patients History and Physical, chart, labs and discussed the procedure including the risks, benefits and alternatives for the proposed anesthesia with the  patient or authorized representative who has indicated his/her understanding and acceptance.   Dental advisory given  Plan Discussed with: CRNA  Anesthesia Plan Comments:         Anesthesia Quick Evaluation

## 2018-08-11 NOTE — OR Nursing (Signed)
Patient instructed with use of incentive spirometer with return demonstration.  Patient reports completing her course of antibiotics for UTI yesterday.  Patient denies any s/s of uti.

## 2018-08-11 NOTE — Transfer of Care (Signed)
Immediate Anesthesia Transfer of Care Note  Patient: TORRE PIKUS  Procedure(s) Performed: TOTAL HIP ARTHROPLASTY (Right )  Patient Location: PACU  Anesthesia Type:Spinal  Level of Consciousness: awake, alert , oriented and patient cooperative  Airway & Oxygen Therapy: Patient Spontanous Breathing and Patient connected to nasal cannula oxygen  Post-op Assessment: Report given to RN and Post -op Vital signs reviewed and stable  Post vital signs: Reviewed and stable  Last Vitals:  Vitals Value Taken Time  BP    Temp    Pulse 66 08/11/2018  1:58 PM  Resp    SpO2 94 % 08/11/2018  1:58 PM  Vitals shown include unvalidated device data.  Last Pain:  Vitals:   08/11/18 0924  TempSrc: Temporal  PainSc: 8          Complications: No apparent anesthesia complications

## 2018-08-12 ENCOUNTER — Encounter: Payer: Self-pay | Admitting: Orthopedic Surgery

## 2018-08-12 MED ORDER — SODIUM CHLORIDE 0.9 % IV BOLUS
500.0000 mL | Freq: Once | INTRAVENOUS | Status: AC
  Administered 2018-08-12: 500 mL via INTRAVENOUS

## 2018-08-12 MED ORDER — TRAMADOL HCL 50 MG PO TABS: 50.0000 mg | ORAL_TABLET | ORAL | 0 refills | Status: DC | PRN

## 2018-08-12 MED ORDER — OXYCODONE HCL 5 MG PO TABS: 5.0000 mg | ORAL_TABLET | ORAL | 0 refills | Status: DC | PRN

## 2018-08-12 MED ORDER — ENOXAPARIN SODIUM 40 MG/0.4ML ~~LOC~~ SOLN: 40.0000 mg | SUBCUTANEOUS | 0 refills | Status: DC

## 2018-08-12 NOTE — Evaluation (Signed)
Occupational Therapy Evaluation Patient Details Name: Mackenzie [name] MRN: [id number] DOB: July 28, 1956 Today's Date: 08/12/2018    History of Present Illness 62yo female POD#1 s/p R THA with posterior total hip precautions, RLE WBAT.    Clinical Impression   Pt seen for OT evaluation this date, POD#1 from above surgery. Pt was independent in all ADLs prior to surgery, however reporting difficulty performing 2/2 R hip pain. Pt working full time prior to surgery as a Restaurant manager, fast food. Pt is eager to return to PLOF with less pain and improved safety and independence. Pt currently requires max assist for LB dressing and bathing due to pain, limited AROM of R hip, decreased knowledge of precautions, and difficulty with low BP this am (please see detail below; RN notified). Pt able to recall 1/3 posterior total hip precautions after initial instruction and had difficulty with recall at end of session and unable to verbalize how to implement during ADL and mobility. Pt/spouse/dtr instructed in posterior total hip precautions and how to implement, self care skills, DME/AE for LB bathing and dressing tasks. Pt would benefit from additional training/edu including falls prevention strategies, home/routines modifications, AE/DME use, self care skills, compression stocking mgt strategies, and car transfer techniques to help maintain precautions with or without assistive devices to support recall and carryover prior to discharge. Recommend HHOT services upon discharge *pending low BP issues have resolved/been addressed and pending pt's progress with therapy.     Follow Up Recommendations  Home health OT(pending BP issues addressed/resolved)    Equipment Recommendations  3 in 1 bedside commode;Other (comment)(reacher, LH sponge, LH shoe horn)    Recommendations for Other Services       Precautions / Restrictions Precautions Precautions: Fall;Posterior Hip;Other (comment)(watch BP) Precaution Booklet Issued:  No Precaution Comments: pt educated in precautions, able to recall 1/3 requiring verbal cues for other 2; would benefit from additional edu/training to support recall and carryover Restrictions Weight Bearing Restrictions: Yes RLE Weight Bearing: Weight bearing as tolerated      Mobility Bed Mobility               General bed mobility comments: unable to formally assess 2/2 low BP in supine and with HOB elevated; RN requesting hold mobility  Transfers                 General transfer comment: unable to formally assess 2/2 low BP in supine and with HOB elevated; RN requesting hold mobility    Balance                                           ADL either performed or assessed with clinical judgement   ADL Overall ADL's : Needs assistance/impaired Eating/Feeding: Sitting;Independent   Grooming: Sitting;Independent   Upper Body Bathing: Sitting;Supervision/ safety Upper Body Bathing Details (indicate cue type and reason): 2/2 low BP Lower Body Bathing: Sit to/from stand;Moderate assistance   Upper Body Dressing : Sitting;Supervision/safety   Lower Body Dressing: Sit to/from stand;Moderate assistance     Toilet Transfer Details (indicate cue type and reason): unable to attempt 2/2 low BP                 Vision Baseline Vision/History: Wears glasses Wears Glasses: At all times(or contacts) Patient Visual Report: No change from baseline       Perception     Praxis  Pertinent Vitals/Pain Pain Assessment: No/denies pain(at rest; "zinger" in R hip when she adjusted herself in bed)     Hand Dominance Right   Extremity/Trunk Assessment Upper Extremity Assessment Upper Extremity Assessment: Overall WFL for tasks assessed   Lower Extremity Assessment Lower Extremity Assessment: Defer to PT evaluation(unable to fully assess)       Communication Communication Communication: No difficulties   Cognition Arousal/Alertness:  Awake/alert Behavior During Therapy: WFL for tasks assessed/performed Overall Cognitive Status: Within Functional Limits for tasks assessed                                     General Comments  BP in supine w/ BLE elevated/head flat = 92/51, 93/49... HOB elevated = 78/47 + symptomatic... HOB lowered/BLE elevated/SCDs reapplied = 92/50... HOB elevated/BLE elevated/SCDs on = 82/58 (manual by RN); O2 97-98% on 2L t/o, HR 64-71    Exercises Other Exercises Other Exercises: pt/spouse/dtr educated in posterior THPs and how to implement during bed mobility, transfers, and ADL tasks, pt unable to trial 2/2 low BP Other Exercises: pt/spouse/dtr educated in AE for LB ADL tasks to help maintain precautions and max indep w/ ADL   Shoulder Instructions      Home Living Family/patient expects to be discharged to:: Private residence Living Arrangements: Spouse/significant other Available Help at Discharge: Family;Available 24 hours/day Type of Home: House Home Access: Stairs to enter [employer] of Steps: 3 steps for garage/primary entrance Entrance Stairs-Rails: Right Home Layout: One level     Bathroom Shower/Tub: Tub/shower unit;Walk-in shower   Bathroom Toilet: Standard(has both)     Home Equipment: Shower seat - built in;Grab bars - tub/shower          Prior Functioning/Environment Level of Independence: Independent        Comments: Pt indep with mobility, ADL, IADL, driving, and working as a Restaurant manager, fast food; 1 fall (going down carpeted steps)        OT Problem List: Decreased strength;Decreased knowledge of use of DME or AE;Decreased range of motion;Cardiopulmonary status limiting activity;Pain      OT Treatment/Interventions: Self-care/ADL training;Balance training;Therapeutic exercise;Therapeutic activities;DME and/or AE instruction;Patient/family education    OT Goals(Current goals can be found in the care plan section) Acute Rehab OT  Goals Patient Stated Goal: to have my BP get better so I can do therapy OT Goal Formulation: With patient/family Time For Goal Achievement: 08/26/18 Potential to Achieve Goals: Good ADL Goals Pt Will Perform Lower Body Dressing: with min assist;with adaptive equipment;sit to/from stand(maintaining posterior THPs) Pt Will Transfer to Toilet: ambulating;bedside commode;with min guard assist(maintaining posterior THPs, LRAD For amb) Additional ADL Goal #1: Pt will perform bed mobility with supervision, maintaining posterior THPs t/o with no verbal cues. Additional ADL Goal #2: Pt will independently instruct family/caregiver in compression stocking mgt including wear schedule, donning/doffing, and positioning.  OT Frequency: Min 1X/week   Barriers to D/C:            Co-evaluation              AM-PAC OT "6 Clicks" Daily Activity     Outcome Measure Help from another person eating meals?: None Help from another person taking care of personal grooming?: None Help from another person toileting, which includes using toliet, bedpan, or urinal?: A Lot Help from another person bathing (including washing, rinsing, drying)?: A Lot Help from another person to put on and taking off regular upper  body clothing?: None Help from another person to put on and taking off regular lower body clothing?: A Lot 6 Click Score: 18   End of Session Nurse Communication: Other (comment)(BP status)  Activity Tolerance: Treatment limited secondary to medical complications (Comment)(low BP) Patient left: in bed;with call bell/phone within reach;with bed alarm set;with nursing/sitter in room;with family/visitor present;with SCD's reapplied  OT Visit Diagnosis: Other abnormalities of gait and mobility (R26.89);Pain Pain - Right/Left: Right Pain - part of body: Hip                Time: 1610-9604 OT Time Calculation (min): 36 min Charges:  OT General Charges $OT Visit: 1 Visit OT Evaluation $OT Eval Low  Complexity: 1 Low OT Treatments $Self Care/Home Management : 23-37 mins  Jeni Salles, MPH, MS, OTR/L ascom [phone] 08/12/18, 9:48 AM

## 2018-08-12 NOTE — Progress Notes (Signed)
Clinical Social Worker (CSW) received SNF consult. PT is recommending home health. RN case manager aware of above. Please reconsult if future social work needs arise. CSW signing off.   [place] Jr, LCSW [phone] 

## 2018-08-12 NOTE — Evaluation (Signed)
Physical Therapy Evaluation Patient Details Name: Mackenzie [name] MRN: [id number] DOB: [birth year] Today's Date: 08/12/2018   History of Present Illness  62yo female s/p R THA (posterior approach) 08/11/18.  Clinical Impression  Pt with some hesitancy with PT, but willing to participate and had good encouragement from present family members.  She showed good effort with ~15 minutes of exercises and precaution education as well as additional gait training into the hallway with slow, but safe, ambulation using walker.  Pt able to do most acts with out direct assist but required a lot of cuing and encouragement.  Overall progressing nicely POD1.  BP soft t/o session, but asymptomatic and did not limit participation.       Follow Up Recommendations Home health PT    Equipment Recommendations  Rolling walker with 5" wheels;3in1 (PT)    Recommendations for Other Services       Precautions / Restrictions Precautions Precautions: Fall;Posterior Hip;Other (comment) Precaution Booklet Issued: No Precaution Comments: Pt recalled 2 of 3 precautions w/o cuing Restrictions Weight Bearing Restrictions: Yes RLE Weight Bearing: Weight bearing as tolerated      Mobility  Bed Mobility Overal bed mobility: Modified Independent             General bed mobility comments: Pt showed good effort in getting to EOB, struggled to get to full sitting w/o assist, but mananged to do so and stay w/in precaution limits  Transfers Overall transfer level: Modified independent Equipment used: Rolling walker (2 wheeled)             General transfer comment: Cuing for set up, sequencing and precaution awareness able to rise w/o direct assist but with much effort  Ambulation/Gait Ambulation/Gait assistance: Supervision Gait Distance (Feet): 75 Feet Assistive device: Rolling walker (2 wheeled)(chair follow)       General Gait Details: Pt with somewhat guarded/hesitant ambulation but improved with  increased distance and cuing.  Pt able to take weight through R LE with only minimal slowing and increased use of UEs on walker  Stairs            Wheelchair Mobility    Modified Rankin (Stroke Patients Only)       Balance Overall balance assessment: Modified Independent                                           Pertinent Vitals/Pain Pain Assessment: 0-10 Pain Score: 6  Pain Location: c/o no groin pain (that was present pre-surgery), somewhat guarded with most acts    Home Living Family/patient expects to be discharged to:: Private residence Living Arrangements: Spouse/significant other Available Help at Discharge: Family;Available 24 hours/day Type of Home: House Home Access: Stairs to enter Entrance Stairs-Rails: Right Entrance Stairs-Number of Steps: 3 steps for garage/primary entrance Home Layout: One level Home Equipment: Shower seat - built in;Grab bars - tub/shower      Prior Function Level of Independence: Independent         Comments: Independent, working, driving, able to stay active     Hand Dominance   Dominant Hand: Right    Extremity/Trunk Assessment   Upper Extremity Assessment Upper Extremity Assessment: Overall WFL for tasks assessed    Lower Extremity Assessment Lower Extremity Assessment: (expected post-op weakness in R LE, unable to SLR)       Communication   Communication: No difficulties  Cognition Arousal/Alertness: Awake/alert  Behavior During Therapy: WFL for tasks assessed/performed Overall Cognitive Status: Within Functional Limits for tasks assessed                                        General Comments General comments (skin integrity, edema, etc.): Pt's BP soft t/o session, though asymptomatic for dizzy/lightheaded/etc.  supine BP was 93/49 on arrival, sitting 87/52 then 102/71 after 1-2 minutes, standing 110/62.  (No S/S of OSHT)    Exercises Total Joint Exercises Ankle  Circles/Pumps: AROM;10 reps Quad Sets: Strengthening;10 reps Gluteal Sets: Strengthening;10 reps Short Arc Quad: Strengthening;10 reps Heel Slides: Strengthening;10 reps;AROM Hip ABduction/ADduction: Strengthening;10 reps Straight Leg Raises: AAROM;5 reps Other Exercises Other Exercises: pt/spouse/dtr educated in posterior THPs and how to implement during bed mobility, transfers, and ADL tasks, pt unable to trial 2/2 low BP Other Exercises: pt/spouse/dtr educated in AE for LB ADL tasks to help maintain precautions and max indep w/ ADL   Assessment/Plan    PT Assessment Patient needs continued PT services  PT Problem List Decreased strength;Decreased range of motion;Decreased activity tolerance;Decreased balance;Decreased mobility;Decreased coordination;Decreased cognition;Decreased knowledge of use of DME;Decreased safety awareness;Pain       PT Treatment Interventions DME instruction;Gait training;Stair training;Functional mobility training;Therapeutic activities;Therapeutic exercise;Balance training;Neuromuscular re-education;Patient/family education    PT Goals (Current goals can be found in the Care Plan section)  Acute Rehab PT Goals Patient Stated Goal: get back to walking, driving, etc PT Goal Formulation: With patient Time For Goal Achievement: 08/26/18 Potential to Achieve Goals: Good    Frequency BID   Barriers to discharge        Co-evaluation               AM-PAC PT "6 Clicks" Mobility  Outcome Measure Help needed turning from your back to your side while in a flat bed without using bedrails?: A Little Help needed moving from lying on your back to sitting on the side of a flat bed without using bedrails?: A Little Help needed moving to and from a bed to a chair (including a wheelchair)?: None Help needed standing up from a chair using your arms (e.g., wheelchair or bedside chair)?: A Little Help needed to walk in hospital room?: None Help needed climbing  3-5 steps with a railing? : A Little 6 Click Score: 20    End of Session Equipment Utilized During Treatment: Gait belt Activity Tolerance: Patient tolerated treatment well Patient left: with chair alarm set;with call bell/phone within reach;with family/visitor present Nurse Communication: Mobility status(orthostatic BPs and lack of associated symptoms) PT Visit Diagnosis: Muscle weakness (generalized) (M62.81);Difficulty in walking, not elsewhere classified (R26.2);Pain Pain - Right/Left: Right Pain - part of body: Hip    Time: 2800-3491 PT Time Calculation (min) (ACUTE ONLY): 45 min   Charges:   PT Evaluation $PT Eval Low Complexity: 1 Low PT Treatments $Gait Training: 8-22 mins $Therapeutic Exercise: 8-22 mins        Kreg Shropshire, DPT 08/12/2018, 11:46 AM

## 2018-08-12 NOTE — Care Management Note (Signed)
Case Management Note  Patient Details  Name: Mackenzie [name] MRN: [id number] Date of Birth: May 18, 1956  Subjective/Objective:                  RNCM met with patient, [name] and her daughter to discuss discharge planning. She requests a bedside commode and rolling walker. She uses [facility] [phone]. Surgeon would like for patient to have Lovenox 34m injection daily for 14 days no-refills called in to pharmacy.  She prefers [facility] if they can take her insurance.  Action/Plan: CMS.gov list of home health care agencies provided to patient for review. Affiliation with [facility] shared also with patient regarding DME and home health services.  [facility]. [facility].  [facility].  RNCM will follow.   Expected Discharge Date:  08/13/18               Expected Discharge Plan:     In-House Referral:     Discharge planning Services  CM Consult  Post Acute Care Choice:  Durable Medical Equipment, Home Health Choice offered to:  Patient, Adult Children  DME Arranged:  3-N-1, Walker rolling DME Agency:  [facility]  [facility]  [facility]  Status of Service:  In process, will continue to follow  If discussed at Long Length of Stay Meetings, dates discussed:    Additional Comments:  AMarshell Garfinkel RN 08/12/2018, 11:23 AM

## 2018-08-12 NOTE — Progress Notes (Signed)
Physical Therapy Treatment Patient Details Name: Mackenzie [name] MRN: [id number] DOB: 1955-09-14 Today's Date: 08/12/2018    History of Present Illness 62yo female s/p R THA (posterior approach) 08/11/18.    PT Comments    Pt was able to circumambulate the nurses' station this afternoon with relatively consistent cadence and no safety issues.  She showed great effort with mobility and exercises but does require assist with bed mobility and SLR activities.  She is making good POD1 gains, has good family support and is eager to work hard with PT and get back to her baseline activity level at home.    Follow Up Recommendations  Home health PT     Equipment Recommendations  Rolling walker with 5" wheels;3in1 (PT)    Recommendations for Other Services       Precautions / Restrictions Precautions Precautions: Fall;Posterior Hip;Other (comment) Restrictions RLE Weight Bearing: Weight bearing as tolerated    Mobility  Bed Mobility Overal bed mobility: Needs Assistance Bed Mobility: Sit to Supine;Supine to Sit       Sit to supine: Min assist   General bed mobility comments: Pt able to get L LE and body around into bed, but needed light assist to lift and place R LE into bed  Transfers Overall transfer level: Modified independent Equipment used: Rolling walker (2 wheeled)             General transfer comment: Cuing for set up, sequencing and precaution awareness able to rise w/o direct assist but with much effort  Ambulation/Gait Ambulation/Gait assistance: Supervision Gait Distance (Feet): 250 Feet Assistive device: Rolling walker (2 wheeled)       General Gait Details: Pt more confident this afternoon and able to maintain consistent speed.  She did have some inconsistent use of UEs/walker as well as occasional veering, but no safety issues or excessive hesitation.  Pt generally did well, motivated to push herself.   Stairs             Wheelchair Mobility     Modified Rankin (Stroke Patients Only)       Balance Overall balance assessment: Modified Independent                                          Cognition Arousal/Alertness: Awake/alert Behavior During Therapy: WFL for tasks assessed/performed Overall Cognitive Status: Within Functional Limits for tasks assessed                                        Exercises Total Joint Exercises Ankle Circles/Pumps: AROM;10 reps Quad Sets: Strengthening;10 reps Gluteal Sets: Strengthening;10 reps Short Arc Quad: Strengthening;10 reps Heel Slides: Strengthening;10 reps;AROM Hip ABduction/ADduction: Strengthening;10 reps Straight Leg Raises: AAROM;5 reps(Pt able to lift leg <1" AROM with excessive effort.) Knee Flexion: Strengthening;10 reps    General Comments General comments (skin integrity, edema, etc.): Pt with no orthostatic symptoms, dizziness, etc      Pertinent Vitals/Pain Pain Score: 7     Home Living                      Prior Function            PT Goals (current goals can now be found in the care plan section) Progress towards PT goals: Progressing toward goals  Frequency    BID      PT Plan Current plan remains appropriate    Co-evaluation              AM-PAC PT "6 Clicks" Mobility   Outcome Measure  Help needed turning from your back to your side while in a flat bed without using bedrails?: None Help needed moving from lying on your back to sitting on the side of a flat bed without using bedrails?: A Little Help needed moving to and from a bed to a chair (including a wheelchair)?: None Help needed standing up from a chair using your arms (e.g., wheelchair or bedside chair)?: A Little Help needed to walk in hospital room?: None Help needed climbing 3-5 steps with a railing? : A Little 6 Click Score: 21    End of Session Equipment Utilized During Treatment: Gait belt Activity Tolerance: Patient  tolerated treatment well Patient left: with call bell/phone within reach;with family/visitor present;with bed alarm set   PT Visit Diagnosis: Muscle weakness (generalized) (M62.81);Difficulty in walking, not elsewhere classified (R26.2);Pain Pain - Right/Left: Right Pain - part of body: Hip     Time: 1655-3748 PT Time Calculation (min) (ACUTE ONLY): 30 min  Charges:  $Gait Training: 8-22 mins $Therapeutic Exercise: 8-22 mins                     Kreg Shropshire, DPT 08/12/2018, 3:08 PM

## 2018-08-12 NOTE — Progress Notes (Signed)
Patient unintentionally pulled Hemovac out. Dressing reinforced. MD paged.

## 2018-08-12 NOTE — NC FL2 (Signed)
[place] LEVEL OF CARE SCREENING TOOL     IDENTIFICATION  Patient Name: Mackenzie [name] Birthdate: [birth year] Sex: female Admission Date (Current Location): 08/11/2018  [place] and [place] Number:  Engineering geologist and Address:  [facility], [address], [place], [place] [address]      Provider Number: [id number]  Attending Physician Name and Address:  Dereck Leep, MD  Relative Name and Phone Number:       Current Level of Care: Hospital Recommended Level of Care: [place] Prior Approval Number:    Date Approved/Denied:   PASRR Number: [id number])  Discharge Plan: SNF    Current Diagnoses: Patient Active Problem List   Diagnosis Date Noted  . Adenomatous colon polyp 08/11/2018  . Diverticulosis 08/11/2018  . Dyspnea on exertion 08/11/2018  . Environmental allergies 08/11/2018  . Ganglion of joint 08/11/2018  . Hiatal hernia 08/11/2018  . Localized, primary osteoarthritis of hand 08/11/2018  . Obesity 08/11/2018  . Osteoarthritis of knee 08/11/2018  . Hypertension 08/11/2018  . B12 deficiency 08/11/2018  . H/O total hip arthroplasty 08/11/2018  . Essential hypertension 02/08/2017  . Hyperlipidemia 02/08/2017  . COPD (chronic obstructive pulmonary disease) ([place]) 02/08/2017  . Anemia 02/08/2017  . Vitamin B12 deficiency 02/08/2017  . Lichen sclerosus et atrophicus 02/08/2017  . Adult ADHD 01/31/2016    Orientation RESPIRATION BLADDER Height & Weight     Self, Time, Situation, Place  Normal Continent Weight: 220 lb 7 oz (100 kg) Height:  5\' 7"  (170.2 cm)  BEHAVIORAL SYMPTOMS/MOOD NEUROLOGICAL BOWEL NUTRITION STATUS      Continent Diet(Diet: Regular )  AMBULATORY STATUS COMMUNICATION OF NEEDS Skin   Extensive Assist Verbally Surgical wounds(Incision: Right Hip. )                       Personal Care Assistance Level of Assistance  Bathing, Feeding, Dressing Bathing Assistance: Limited  assistance Feeding assistance: Independent Dressing Assistance: Limited assistance     Functional Limitations Info  Sight, Hearing, Speech Sight Info: Adequate Hearing Info: Adequate Speech Info: Adequate    SPECIAL CARE FACTORS FREQUENCY  PT (By licensed PT), OT (By licensed OT)     PT Frequency: (5) OT Frequency: (5)            Contractures      Additional Factors Info  Code Status, Allergies Code Status Info: (Full Code. ) Allergies Info: (No Known Allergies. )           Current Medications (08/12/2018):  This is the current hospital active medication list Current Facility-Administered Medications  Medication Dose Route Frequency Provider Last Rate Last Dose  . 0.9 %  sodium chloride infusion   Intravenous Continuous Dereck Leep, MD 100 mL/hr at 08/12/18 4496    . acetaminophen (OFIRMEV) IV 1,000 mg  1,000 mg Intravenous Q6H Hooten, Laurice Record, MD   Stopped at 08/12/18 [phone]  . acetaminophen (TYLENOL) tablet 325-650 mg  325-650 mg Oral Q6H PRN Hooten, Laurice Record, MD      . ALPRAZolam Duanne Moron) tablet 0.5 mg  0.5 mg Oral QHS Hooten, Laurice Record, MD      . alum & mag hydroxide-simeth (MAALOX/MYLANTA) 200-200-20 MG/5ML suspension 30 mL  30 mL Oral Q4H PRN Hooten, Laurice Record, MD      . amphetamine-dextroamphetamine (ADDERALL) tablet 30 mg  30 mg Oral BID Hooten, Laurice Record, MD      . bisacodyl (DULCOLAX) suppository 10 mg  10 mg Rectal Daily PRN Hooten, Laurice Record, MD      . ceFAZolin (ANCEF) IVPB 2g/100 mL premix  2 g Intravenous Q6H Hooten, Laurice Record, MD 200 mL/hr at 08/12/18 0635 2 g at 08/12/18 0635  . celecoxib (CELEBREX) capsule 200 mg  200 mg Oral BID Dereck Leep, MD   200 mg at 08/12/18 0840  . cholecalciferol (VITAMIN D3) tablet 1,000 Units  1,000 Units Oral Q72H Hooten, Laurice Record, MD      . clobetasol cream (TEMOVATE) 0.93 % 1 application  1 application Topical Daily PRN Hooten, Laurice Record, MD      . diphenhydrAMINE (BENADRYL) 12.5 MG/5ML elixir 12.5-25 mg  12.5-25 mg Oral Q4H PRN  Hooten, Laurice Record, MD      . enoxaparin (LOVENOX) injection 40 mg  40 mg Subcutaneous Q24H Hooten, Laurice Record, MD   40 mg at 08/12/18 8182  . ferrous sulfate tablet 325 mg  325 mg Oral BID WC Dereck Leep, MD   325 mg at 08/12/18 0839  . gabapentin (NEURONTIN) capsule 100 mg  100 mg Oral TID Dereck Leep, MD   100 mg at 08/12/18 9937  . hydrochlorothiazide (HYDRODIURIL) tablet 25 mg  25 mg Oral Daily Hooten, Laurice Record, MD      . HYDROmorphone (DILAUDID) injection 0.5-1 mg  0.5-1 mg Intravenous Q4H PRN Hooten, Laurice Record, MD      . magnesium hydroxide (MILK OF MAGNESIA) suspension 30 mL  30 mL Oral Daily Hooten, Laurice Record, MD   30 mL at 08/12/18 1696  . menthol-cetylpyridinium (CEPACOL) lozenge 3 mg  1 lozenge Oral PRN Hooten, Laurice Record, MD       Or  . phenol (CHLORASEPTIC) mouth spray 1 spray  1 spray Mouth/Throat PRN Hooten, Laurice Record, MD      . metoCLOPramide (REGLAN) tablet 5-10 mg  5-10 mg Oral Q8H PRN Hooten, Laurice Record, MD       Or  . metoCLOPramide (REGLAN) injection 5-10 mg  5-10 mg Intravenous Q8H PRN Hooten, Laurice Record, MD      . metoCLOPramide (REGLAN) tablet 10 mg  10 mg Oral TID AC & HS Hooten, Laurice Record, MD   10 mg at 08/12/18 0840  . ondansetron (ZOFRAN) tablet 4 mg  4 mg Oral Q6H PRN Hooten, Laurice Record, MD       Or  . ondansetron (ZOFRAN) injection 4 mg  4 mg Intravenous Q6H PRN Hooten, Laurice Record, MD      . oxyCODONE (Oxy IR/ROXICODONE) immediate release tablet 10 mg  10 mg Oral Q4H PRN Hooten, Laurice Record, MD      . oxyCODONE (Oxy IR/ROXICODONE) immediate release tablet 5 mg  5 mg Oral Q4H PRN Hooten, Laurice Record, MD   5 mg at 08/12/18 0315  . pantoprazole (PROTONIX) EC tablet 40 mg  40 mg Oral BID Dereck Leep, MD   40 mg at 08/12/18 0840  . polyethylene glycol (MIRALAX / GLYCOLAX) packet 17 g  17 g Oral QHS Dereck Leep, MD   17 g at 08/11/18 2042  . pravastatin (PRAVACHOL) tablet 40 mg  40 mg Oral q1800 Hooten, Laurice Record, MD      . senna-docusate (Senokot-S) tablet 1 tablet  1 tablet Oral BID Dereck Leep, MD   1 tablet at 08/12/18 0840  . sodium phosphate (FLEET) 7-19 GM/118ML enema 1 enema  1 enema Rectal Once PRN Hooten, Laurice Record, MD      . sucralfate (  CARAFATE) tablet 1 g  1 g Oral BID Dereck Leep, MD   1 g at 08/12/18 0839  . traMADol (ULTRAM) tablet 50-100 mg  50-100 mg Oral Q4H PRN Hooten, Laurice Record, MD   50 mg at 08/11/18 1840  . vitamin C (ASCORBIC ACID) tablet 500 mg  500 mg Oral Daily Hooten, Laurice Record, MD   500 mg at 08/12/18 0840     Discharge Medications: Please see discharge summary for a list of discharge medications.  Relevant Imaging Results:  Relevant Lab Results:   Additional Information (SSN: [id number])  Chelly Dombeck, Veronia Beets, LCSW

## 2018-08-12 NOTE — Progress Notes (Signed)
   Subjective: 1 Day Post-Op Procedure(s) (LRB): TOTAL HIP ARTHROPLASTY (Right) Patient reports pain as mild.  Rested fairly well during the night.  Voicing no complaints Patient is well, and has had no acute complaints or problems We will start therapy today.  Plan is to go Home after hospital stay. no nausea and no vomiting Patient denies any chest pains or shortness of breath. Objective: Vital signs in last 24 hours: Temp:  [95.8 F (35.4 C)-97.9 F (36.6 C)] 97.7 F (36.5 C) (12/05 0058) Pulse Rate:  [44-75] 59 (12/05 0251) Resp:  [12-19] 16 (12/05 0058) BP: (78-132)/(42-89) 105/56 (12/05 0626) SpO2:  [92 %-100 %] 98 % (12/05 0058) Weight:  [100 kg] 100 kg (12/04 1645) well approximated incision Heels are non tender and elevated off the bed using rolled towels Intake/Output from previous day: 12/04 0701 - 12/05 0700 In: 4788.8 [P.O.:530; I.V.:3258.2; IV Piggyback:1000.6] Out: 420 [Urine:100; Drains:170; Blood:150] Intake/Output this shift: No intake/output data recorded.  Recent Labs    08/11/18 0935 08/11/18 1623  HGB 14.3 13.5   Recent Labs    08/11/18 0935 08/11/18 1623  WBC  --  10.3  RBC  --  4.39  HCT 42.0 40.3  PLT  --  217   Recent Labs    08/11/18 0935 08/11/18 1623  NA 139  --   K 3.9  --   CREATININE  --  0.73  GLUCOSE 106*  --    No results for input(s): LABPT, INR in the last 72 hours.  EXAM General - Patient is Alert, Appropriate and Oriented Extremity - Neurologically intact Neurovascular intact Sensation intact distally Intact pulses distally Dorsiflexion/Plantar flexion intact No cellulitis present Compartment soft Dressing - dressing C/D/I Motor Function - intact, moving foot and toes well on exam.  Able to do straight leg raise on her own  Past Medical History:  Diagnosis Date  . ADD (attention deficit disorder)   . Anemia    iron deficiency, b12  . Anxiety   . Barrett's esophagus 2019  . Colon polyps   . COPD (chronic  obstructive pulmonary disease) ([place])   . Essential hypertension   . GERD (gastroesophageal reflux disease)   . History of hiatal hernia   . Hyperlipidemia   . Lichen sclerosus   . Osteoarthritis   . Other abnormal auditory perceptions, bilateral 2019   hip and back issues  . RLS (restless legs syndrome)   . Vitamin B12 deficiency     Assessment/Plan: 1 Day Post-Op Procedure(s) (LRB): TOTAL HIP ARTHROPLASTY (Right) Active Problems:   H/O total hip arthroplasty  Estimated body mass index is 34.53 kg/m as calculated from the following:   Height as of this encounter: 5\' 7"  (1.702 m).   Weight as of this encounter: 100 kg. Advance diet Up with therapy D/C IV fluids Plan for discharge tomorrow Discharge home with home health  Labs: None DVT Prophylaxis - Lovenox, Foot Pumps and TED hose Weight-Bearing as tolerated to right leg D/C O2 and Pulse OX and try on Room Air Begin working on bowel movement  Samiha Denapoli R. [place] 08/12/2018, 7:13 AM

## 2018-08-12 NOTE — Anesthesia Postprocedure Evaluation (Signed)
Anesthesia Post Note  Patient: Mackenzie [name]  Procedure(s) Performed: TOTAL HIP ARTHROPLASTY (Right )  Patient location during evaluation: Nursing Unit Anesthesia Type: Spinal Level of consciousness: oriented and awake and alert Pain management: pain level controlled Vital Signs Assessment: post-procedure vital signs reviewed and stable Respiratory status: spontaneous breathing and respiratory function stable Cardiovascular status: blood pressure returned to baseline and stable Postop Assessment: no headache, no backache, no apparent nausea or vomiting and patient able to bend at knees Anesthetic complications: no     Last Vitals:  Vitals:   08/12/18 0500 08/12/18 0626  BP: (!) 86/53 (!) 105/56  Pulse:    Resp:    Temp:    SpO2:      Last Pain:  Vitals:   08/12/18 0626  TempSrc:   PainSc: 1                  Mekhia Brogan Lorenza Chick

## 2018-08-12 NOTE — Discharge Summary (Signed)
Physician Discharge Summary  Patient ID: Mackenzie [name] MRN: [id number] DOB/AGE: May 24, 1956 62 y.o.  Admit date: 08/11/2018 Discharge date: 08/13/2018  Admission Diagnoses:  RIGHT HIP OSTEOARTHRITIS   Discharge Diagnoses: Patient Active Problem List   Diagnosis Date Noted  . Adenomatous colon polyp 08/11/2018  . Diverticulosis 08/11/2018  . Dyspnea on exertion 08/11/2018  . Environmental allergies 08/11/2018  . Ganglion of joint 08/11/2018  . Hiatal hernia 08/11/2018  . Localized, primary osteoarthritis of hand 08/11/2018  . Obesity 08/11/2018  . Osteoarthritis of knee 08/11/2018  . Hypertension 08/11/2018  . B12 deficiency 08/11/2018  . H/O total hip arthroplasty 08/11/2018  . Essential hypertension 02/08/2017  . Hyperlipidemia 02/08/2017  . COPD (chronic obstructive pulmonary disease) ([place]) 02/08/2017  . Anemia 02/08/2017  . Vitamin B12 deficiency 02/08/2017  . Lichen sclerosus et atrophicus 02/08/2017  . Adult ADHD 01/31/2016    Past Medical History:  Diagnosis Date  . ADD (attention deficit disorder)   . Anemia    iron deficiency, b12  . Anxiety   . Barrett's esophagus 2019  . Colon polyps   . COPD (chronic obstructive pulmonary disease) ([place])   . Essential hypertension   . GERD (gastroesophageal reflux disease)   . History of hiatal hernia   . Hyperlipidemia   . Lichen sclerosus   . Osteoarthritis   . Other abnormal auditory perceptions, bilateral 2019   hip and back issues  . RLS (restless legs syndrome)   . Vitamin B12 deficiency      Transfusion: No transfusions during this admission   Consultants (if any):   Discharged Condition: Improved  Hospital Course: Mackenzie [name] is an 62 y.o. female who was admitted 08/11/2018 with a diagnosis of degenerative arthrosis right hip and went to the operating room on 08/11/2018 and underwent the above named procedures.    Surgeries:Procedure(s): TOTAL HIP ARTHROPLASTY on 08/11/2018  PRE-OPERATIVE DIAGNOSIS:  Degenerative arthrosis of the right hip, primary  POST-OPERATIVE DIAGNOSIS:  Same  PROCEDURE:  Right total hip arthroplasty  SURGEON:  Marciano Sequin. M.D.  ASSISTANT:  Vance Peper, PA (present and scrubbed throughout the case, critical for assistance with exposure, retraction, instrumentation, and closure)  ANESTHESIA: spinal  ESTIMATED BLOOD LOSS: 150 mL  FLUIDS REPLACED: 1500 mL of crystalloid  DRAINS: 2 medium drains to a Hemovac reservoir  IMPLANTS UTILIZED: DePuy 13.5 mm small stature AML femoral stem, 50 mm OD Pinnacle 100 acetabular component, +4 mm 10 degree Pinnacle Marathon polyethylene insert, and a 32 mm CoCr +5 mm hip ball  INDICATIONS FOR SURGERY: Mackenzie [name] is a 62 y.o. year old female with a long history of progressive hip and groin  pain. X-rays demonstrated severe degenerative changes. The patient had not seen any significant improvement despite conservative nonsurgical intervention. After discussion of the risks and benefits of surgical intervention, the patient expressed understanding of the risks benefits and agree with plans for total hip arthroplasty.   The risks, benefits, and alternatives were discussed at length including but not limited to the risks of infection, bleeding, nerve injury, stiffness, blood clots, the need for revision surgery, limb length inequality, dislocation, cardiopulmonary complications, among others, and they were willing to proceed. Patient tolerated the surgery well. No complications .Patient was taken to PACU where she was stabilized and then transferred to the orthopedic floor.  Patient started on Lovenox 30 mg q 12 hrs. Foot pumps applied bilaterally at 80 mm hgb. Heels elevated off bed with rolled towels. No evidence of DVT. Calves non  tender. Negative [place]oman. Physical therapy started on day #1 for gait training and transfer with OT starting on  day #1 for ADL and assisted devices. Patient has done well with therapy.  Ambulated greater than 200 feet upon being discharged.  Was able to ascend and descend 4 steps safely and independently  Patient's IV And Foley were discontinued on day #1 with Hemovac being discontinued on day #2. Dressing was changed on day 2 prior to patient being discharged   She was given perioperative antibiotics:  Anti-infectives (From admission, onward)   Start     Dose/Rate Route Frequency Ordered Stop   08/11/18 1700  ceFAZolin (ANCEF) IVPB 2g/100 mL premix     2 g 200 mL/hr over 30 Minutes Intravenous Every 6 hours 08/11/18 1607 08/12/18 1929   08/11/18 1238  gentamicin (GARAMYCIN) 80 mg in sodium chloride 0.9 % 500 mL irrigation  Status:  Discontinued       As needed 08/11/18 1239 08/11/18 1353   08/11/18 0916  ceFAZolin (ANCEF) 2-4 GM/100ML-% IVPB    Note to Pharmacy:  Milinda Cave   : cabinet override      08/11/18 0916 08/11/18 1103   08/11/18 0600  ceFAZolin (ANCEF) IVPB 2g/100 mL premix     2 g 200 mL/hr over 30 Minutes Intravenous On call to O.R. 08/11/18 4627 08/11/18 1115    .  She was fitted with AV 1 compression foot pump devices, instructed on heel pumps, early ambulation, and fitted with TED stockings bilaterally for DVT prophylaxis.  She benefited maximally from the hospital stay and there were no complications.    Recent vital signs:  Vitals:   08/12/18 0500 08/12/18 0626  BP: (!) 86/53 (!) 105/56  Pulse:    Resp:    Temp:    SpO2:      Recent laboratory studies:  Lab Results  Component Value Date   HGB 13.5 08/11/2018   HGB 14.3 08/11/2018   HGB 15.1 (H) 07/28/2018   Lab Results  Component Value Date   WBC 10.3 08/11/2018   PLT 217 08/11/2018   Lab Results  Component Value Date   INR 0.88 07/28/2018   Lab Results  Component Value Date   NA 139 08/11/2018   K 3.9 08/11/2018   CL 103 07/28/2018   CO2 30 07/28/2018   BUN 16 07/28/2018   CREATININE 0.73 08/11/2018   GLUCOSE 106 (H) 08/11/2018    Discharge Medications:    Allergies as of 08/12/2018   No Known Allergies     Medication List    STOP taking these medications   ibuprofen 400 MG tablet Commonly known as:  ADVIL,MOTRIN     TAKE these medications   AIRBORNE Chew Chew 1 tablet by mouth daily.   ALPRAZolam 0.5 MG tablet Commonly known as:  XANAX Take 0.5 mg by mouth at bedtime.   amphetamine-dextroamphetamine 30 MG tablet Commonly known as:  ADDERALL Take 30 mg by mouth 2 (two) times daily.   celecoxib 200 MG capsule Commonly known as:  CELEBREX Take 200 mg by mouth daily.   cholecalciferol 25 MCG (1000 UT) tablet Commonly known as:  VITAMIN D3 Take 1,000 Units by mouth every 3 (three) days.   clobetasol cream 0.05 % Commonly known as:  TEMOVATE Apply 1 application topically 2 (two) times daily. For 2 weeks then decrease to once daily at hs   cyclobenzaprine 10 MG tablet Commonly known as:  FLEXERIL Take 10 mg by mouth daily as needed for  muscle spasms.   enoxaparin 40 MG/0.4ML injection Commonly known as:  LOVENOX Inject 0.4 mLs (40 mg total) into the skin daily for 14 days. Start taking on:  08/14/2018   ferrous sulfate 325 (65 FE) MG tablet Take 325 mg by mouth 2 (two) times daily with a meal.   gabapentin 100 MG capsule Commonly known as:  NEURONTIN Take 100 mg by mouth 3 (three) times daily.   hydrochlorothiazide 25 MG tablet Commonly known as:  HYDRODIURIL Take 25 mg by mouth daily.   lovastatin 40 MG tablet Commonly known as:  MEVACOR Take 40 mg by mouth at bedtime.   oxyCODONE 5 MG immediate release tablet Commonly known as:  Oxy IR/ROXICODONE Take 1 tablet (5 mg total) by mouth every 4 (four) hours as needed for moderate pain (pain score 4-6).   pantoprazole 40 MG tablet Commonly known as:  PROTONIX Take 40 mg by mouth daily.   polyethylene glycol packet Commonly known as:  MIRALAX / GLYCOLAX Take 17 g by mouth at bedtime.   potassium chloride 20 MEQ packet Commonly known as:  KLOR-CON Take 20  mEq by mouth 2 (two) times daily.   sucralfate 1 g tablet Commonly known as:  CARAFATE Take 1 g by mouth 2 (two) times daily.   traMADol 50 MG tablet Commonly known as:  ULTRAM Take 1-2 tablets (50-100 mg total) by mouth every 4 (four) hours as needed for moderate pain. What changed:    how much to take  when to take this  reasons to take this            Durable Medical Equipment  (From admission, onward)         Start     Ordered   08/11/18 1608  DME Walker rolling  Once    Question:  Patient needs a walker to treat with the following condition  Answer:  S/P total hip arthroplasty   08/11/18 1607   08/11/18 1608  DME Bedside commode  Once    Question:  Patient needs a bedside commode to treat with the following condition  Answer:  S/P total hip arthroplasty   08/11/18 1607          Diagnostic Studies: Dg Hip Port Unilat With Pelvis 1v Right  Result Date: 08/11/2018 CLINICAL DATA:  Post right hip arthroplasty. EXAM: DG HIP (WITH OR WITHOUT PELVIS) 1V PORT RIGHT COMPARISON:  03/30/2018 FINDINGS: Frontal and lateral radiograph of the right hip demonstrate recent total right hip arthroplasty with normal alignment of the orthopedic hardware. No evidence of fracture. Surgical drain and skin staples are noted. IMPRESSION: Status post total right hip arthroplasty without evidence of immediate complications. Electronically Signed   By: Fidela Salisbury M.D.   On: 08/11/2018 16:23   Dg Outside Films Extremity  Result Date: 08/11/2018 This examination belongs to an outside facility and is stored here for comparison purposes only.  Contact the originating outside institution for any associated report or interpretation.   Disposition:   Discharge Instructions    Increase activity slowly   Complete by:  As directed       Follow-up Information    Hooten, Laurice Record, MD On 09/23/2018.   Specialty:  Orthopedic Surgery Why:  at 9:45am Contact information: [place] [address] [phone]            Signed: Watt Climes 08/12/2018, 7:18 AM

## 2018-08-13 LAB — SURGICAL PATHOLOGY

## 2018-08-13 NOTE — Plan of Care (Signed)
  Problem: Activity: Goal: Risk for activity intolerance will decrease Outcome: Progressing   Problem: Pain Managment: Goal: General experience of comfort will improve Outcome: Progressing   Problem: Skin Integrity: Goal: Risk for impaired skin integrity will decrease Outcome: Progressing   Problem: Activity: Goal: Ability to avoid complications of mobility impairment will improve Outcome: Progressing Goal: Ability to tolerate increased activity will improve Outcome: Progressing   Problem: Pain Management: Goal: Pain level will decrease with appropriate interventions Outcome: Progressing   Problem: Skin Integrity: Goal: Will show signs of wound healing Outcome: Progressing

## 2018-08-13 NOTE — Progress Notes (Signed)
Physical Therapy Treatment Patient Details Name: Mackenzie [name] MRN: [id number] DOB: [birth year] Today's Date: 08/13/2018    History of Present Illness 62yo female s/p R THA (posterior approach) 08/11/18.    PT Comments    Patient in bed at start of session, R hip pain 2/10. Pt able to recall precautions with some hesitancy. Pt administered packet including precautions for home use. Able to perform therapeutic exercises with some physical assist (SLR) and moderate verbal cues. Pt mobilized to EOB and mod I, ambulated a total of 313ft with RW and supervision/CGA. Occasional verbal cues for  Decreased weight bearing on UE, upright posture, no LOB noted. Performed stair navigation with minimal verbal cues for technique and CGA x2 rounds. Overall the patient continues to progress towards goals and would benefit from further skilled PT to continue as well as return to PLOF.    Follow Up Recommendations  Home health PT     Equipment Recommendations  Rolling walker with 5" wheels;3in1 (PT)    Recommendations for Other Services       Precautions / Restrictions Precautions Precautions: Fall;Posterior Hip;Other (comment) Precaution Booklet Issued: Yes (comment) Precaution Comments: recalled all three precautions with some hesitancy Restrictions Weight Bearing Restrictions: Yes RLE Weight Bearing: Weight bearing as tolerated    Mobility  Bed Mobility Overal bed mobility: Needs Assistance Bed Mobility: Sit to Supine;Supine to Sit       Sit to supine: Min assist   General bed mobility comments: Very little physical assist needed for RLE  Transfers Overall transfer level: Modified independent Equipment used: Rolling walker (2 wheeled)             General transfer comment: occasional cues for decreased weight bearing through UEs, upright posture  Ambulation/Gait Ambulation/Gait assistance: Supervision;Min guard Gait Distance (Feet): 350 Feet Assistive device: Rolling walker (2  wheeled)       General Gait Details: Pt nervous, but demonstrated good balance and awareness of ambulation/gait speed. Willing to put less weight through UEs   Stairs Stairs: Yes Stairs assistance: Min guard Stair Management: One rail Right;Step to pattern Number of Stairs: 6 General stair comments: x2 rounds, improved quad control on second attempt on RLE   Wheelchair Mobility    Modified Rankin (Stroke Patients Only)       Balance Overall balance assessment: Modified Independent                                          Cognition                                              Exercises Total Joint Exercises Ankle Circles/Pumps: AROM;20 reps Quad Sets: Strengthening;10 reps Towel Squeeze: AROM;Strengthening;Both;10 reps Hip ABduction/ADduction: AROM;Right;10 reps Straight Leg Raises: AAROM;10 reps;Strengthening;Right Long Arc Quad: AROM;Strengthening;Right;10 reps Other Exercises Other Exercises: stair navigation, assessment of understanding of precautions    General Comments        Pertinent Vitals/Pain Pain Assessment: 0-10 Pain Score: 6  Pain Location: R hip pain with ambulation Pain Descriptors / Indicators: Aching;Burning;Sore Pain Intervention(s): Limited activity within patient's tolerance;Monitored during session;Repositioned    Home Living                      Prior Function  PT Goals (current goals can now be found in the care plan section) Progress towards PT goals: Progressing toward goals    Frequency    BID      PT Plan Current plan remains appropriate    Co-evaluation              AM-PAC PT "6 Clicks" Mobility   Outcome Measure  Help needed turning from your back to your side while in a flat bed without using bedrails?: None Help needed moving from lying on your back to sitting on the side of a flat bed without using bedrails?: A Little Help needed moving to and  from a bed to a chair (including a wheelchair)?: None Help needed standing up from a chair using your arms (e.g., wheelchair or bedside chair)?: A Little Help needed to walk in hospital room?: None Help needed climbing 3-5 steps with a railing? : A Little 6 Click Score: 21    End of Session Equipment Utilized During Treatment: Gait belt Activity Tolerance: Patient tolerated treatment well Patient left: with call bell/phone within reach;with family/visitor present;with bed alarm set;in bed Nurse Communication: Mobility status PT Visit Diagnosis: Muscle weakness (generalized) (M62.81);Difficulty in walking, not elsewhere classified (R26.2);Pain Pain - Right/Left: Right Pain - part of body: Hip     Time: 1219-7588 PT Time Calculation (min) (ACUTE ONLY): 38 min  Charges:  $Therapeutic Exercise: 8-22 mins $Therapeutic Activity: 23-37 mins                    Lieutenant Diego PT, DPT 9:38 AM,08/13/18 [phone]

## 2018-08-13 NOTE — Care Management (Signed)
RNCM spoke with patient and her DME has not been delivered. I have reached out to [place] [facility]. I have also notified her home health agency [facility] of patient discharge to home today.  Lovenox $30- patient agrees.  No other RNCM needs at this time.

## 2018-08-13 NOTE — Progress Notes (Signed)
Patient is being discharged to home. DME delivered. DC & Rx instructions given and patient acknowledged understanding. IV removed. HC dsg changed and 2 additional HC given to patient. Husband will be giving Lovenox injections and he demonstrated today. Questions answered.

## 2018-08-13 NOTE — Progress Notes (Signed)
   Subjective: 2 Days Post-Op Procedure(s) (LRB): TOTAL HIP ARTHROPLASTY (Right) Patient reports pain as mild.   Patient is well, and has had no acute complaints or problems Patient did very well with therapy yesterday.  Was able to ambulate around the nurses desk.  Still needs to do steps prior to being discharged. Plan is to go Home after hospital stay. no nausea and no vomiting Patient denies any chest pains or shortness of breath. She inadvertently pulled the drain out yesterday while repositioning herself.  Objective: Vital signs in last 24 hours: Temp:  [97.4 F (36.3 C)] 97.4 F (36.3 C) (12/05 1321) Pulse Rate:  [68-77] 77 (12/06 0502) Resp:  [18] 18 (12/06 0046) BP: (92-98)/(41-58) 96/53 (12/06 0502) SpO2:  [96 %-100 %] 96 % (12/06 0046) well approximated incision Heels are non tender and elevated off the bed using rolled towels Intake/Output from previous day: 12/05 0701 - 12/06 0700 In: 1074.5 [I.V.:874.5; IV Piggyback:200] Out: 160 [Drains:160] Intake/Output this shift: No intake/output data recorded.  Recent Labs    08/11/18 0935 08/11/18 1623  HGB 14.3 13.5   Recent Labs    08/11/18 0935 08/11/18 1623  WBC  --  10.3  RBC  --  4.39  HCT 42.0 40.3  PLT  --  217   Recent Labs    08/11/18 0935 08/11/18 1623  NA 139  --   K 3.9  --   CREATININE  --  0.73  GLUCOSE 106*  --    No results for input(s): LABPT, INR in the last 72 hours.  EXAM General - Patient is Alert, Appropriate and Oriented Extremity - Neurologically intact Neurovascular intact Sensation intact distally Intact pulses distally Dorsiflexion/Plantar flexion intact No cellulitis present Compartment soft Dressing - dressing C/D/I Motor Function - intact, moving foot and toes well on exam.    Past Medical History:  Diagnosis Date  . ADD (attention deficit disorder)   . Anemia    iron deficiency, b12  . Anxiety   . Barrett's esophagus 2019  . Colon polyps   . COPD (chronic  obstructive pulmonary disease) ([place])   . Essential hypertension   . GERD (gastroesophageal reflux disease)   . History of hiatal hernia   . Hyperlipidemia   . Lichen sclerosus   . Osteoarthritis   . Other abnormal auditory perceptions, bilateral 2019   hip and back issues  . RLS (restless legs syndrome)   . Vitamin B12 deficiency     Assessment/Plan: 2 Days Post-Op Procedure(s) (LRB): TOTAL HIP ARTHROPLASTY (Right) Active Problems:   H/O total hip arthroplasty  Estimated body mass index is 34.53 kg/m as calculated from the following:   Height as of this encounter: 5\' 7"  (1.702 m).   Weight as of this encounter: 100 kg. Up with therapy Discharge home with home health after patient does steps this morning  Labs: None DVT Prophylaxis - Lovenox, Foot Pumps and TED hose Weight-Bearing as tolerated to right leg Please wash operative leg, change dressing and apply TED stockings to both legs.  Jillyn Ledger. [place] 08/13/2018, 7:23 AM

## 2018-09-08 DIAGNOSIS — B029 Zoster without complications: Secondary | ICD-10-CM

## 2018-09-08 HISTORY — DX: Zoster without complications: B02.9

## 2018-09-22 ENCOUNTER — Other Ambulatory Visit: Payer: Self-pay | Admitting: Certified Nurse Midwife

## 2018-10-03 DIAGNOSIS — Z96641 Presence of right artificial hip joint: Secondary | ICD-10-CM | POA: Insufficient documentation

## 2018-11-18 ENCOUNTER — Other Ambulatory Visit: Payer: Self-pay | Admitting: Certified Nurse Midwife

## 2018-11-18 DIAGNOSIS — Z1231 Encounter for screening mammogram for malignant neoplasm of breast: Secondary | ICD-10-CM

## 2019-02-16 ENCOUNTER — Ambulatory Visit: Payer: BC Managed Care – PPO

## 2019-03-03 ENCOUNTER — Other Ambulatory Visit: Payer: Self-pay

## 2019-03-03 ENCOUNTER — Ambulatory Visit
Admission: RE | Admit: 2019-03-03 | Discharge: 2019-03-03 | Disposition: A | Discharge status: Home / Self Care | Payer: BC Managed Care – PPO | Source: Ambulatory Visit | Attending: Certified Nurse Midwife | Admitting: Certified Nurse Midwife

## 2019-03-03 DIAGNOSIS — Z1231 Encounter for screening mammogram for malignant neoplasm of breast: Secondary | ICD-10-CM | POA: Insufficient documentation

## 2019-03-21 NOTE — Progress Notes (Signed)
Patient's Name: Mackenzie [name]  MRN: [id number]  Referring Provider: Deetta Perla, MD  DOB: [birth year]  PCP: Idelle Crouch, MD  DOS: 03/22/2019  Note by: Gillis Santa, MD  Service setting: Ambulatory outpatient  Specialty: Interventional Pain Management  Location: [facility] (AMB) Pain Management Facility  Visit type: Initial Patient Evaluation  Patient type: New Patient   Primary Reason(s) for Visit: Encounter for initial evaluation of one or more chronic problems (new to examiner) potentially causing chronic pain, and posing a threat to normal musculoskeletal function. (Level of risk: High) CC: Back Pain (low)  HPI  Ms. Culverhouse is a 63 y.o. year old, female patient, who comes today to see [place] for the first time for an initial evaluation of her chronic pain. She has Essential hypertension; Hyperlipidemia; COPD (chronic obstructive pulmonary disease) ([place]); Anemia; Vitamin B12 deficiency; Lichen sclerosus et atrophicus; Adenomatous colon polyp; Adult ADHD; Diverticulosis; Dyspnea on exertion; Environmental allergies; Ganglion of joint; Hiatal hernia; Localized, primary osteoarthritis of hand; Obesity; Osteoarthritis of knee; Hypertension; B12 deficiency; H/O total hip arthroplasty; Lumbar radiculopathy (R L5/S1); Chronic radicular lumbar pain; Lumbar facet arthropathy; Lumbar spondylosis; Spinal stenosis, lumbar region, with neurogenic claudication; Lumbar degenerative disc disease; Sacroiliac joint pain; and Chronic pain syndrome on their problem list. Today she comes in for evaluation of her Back Pain (low)  Pain Assessment: Location: Lower Back Radiating: radiates down righ tleg to knee in the front then goes down side of leg to mid calf Onset: More than a month ago Duration: Chronic pain Quality: ("Like a lightening strike") Severity: 6 /10 (subjective, self-reported pain score)  Note: Reported level is inconsistent with clinical observations.  Effect on ADL: "I cant move without stopping in my  tracks" Timing: Constant Modifying factors: floating in water BP: 129/73  HR: 77  Onset and Duration: Sudden Cause of pain: Unknown Severity: No change since onset, NAS-11 at its worse: 10/10, NAS-11 at its best: 4/10, NAS-11 now: 2/10 and NAS-11 on the average: 6/10 Timing: Not influenced by the time of the day and After a period of immobility Aggravating Factors: Prolonged standing and Twisting Alleviating Factors: Medications Associated Problems: Swelling Quality of Pain: Horrible, Nagging, Sharp, Stabbing, Throbbing and Uncomfortable Previous Examinations or Tests: MRI scan, X-rays and Nerve conduction test Previous Treatments: Epidural steroid injections, Narcotic medications, Steroid treatments by mouth and Trigger point injections  The patient comes into the clinics today for the first time for a chronic pain management evaluation.   63 year old female with a history of right hip replacement who presents with a chief complaint of low back pain with radiation down her right leg in a posterior lateral distribution and then towards the anterior aspect to her mid calf.  Her pain usually stays above her knee but on occasion it does radiate to calf region.  Patient has completed injection therapy with physical medicine and rehab.  She had a right intra-articular hip injection as well as right S1 transforaminal ESI and L5-S1 transforaminal ESI which were not very effective.  Her last transforaminal epidural injection was performed in April 2019.  Patient's right hip replacement surgery was December 2019.  She states that her mobility has improved as a result of her hip replacement due to significant right hip osteoarthritis.  She continues to have difficulty with pain and weakness.  Her most recent MRI below shows significant pathology at L5-S1 including L5-S1 anterolisthesis of 8 mm, central canal stenosis, right greater than left neuroforaminal stenosis which is graded as severe on the right.  Patient has tried physical therapy in the past which he states was not effective.  She is currently on Celebrex 200 mg daily along with gabapentin 100 mg 3 times daily.  She does have generalized anxiety for which she is prescribed Xanax.  Today I took the time to provide the patient with information regarding my pain practice. The patient was informed that my practice is divided into two sections: an interventional pain management section, as well as a completely separate and distinct medication management section. I explained that I have procedure days for my interventional therapies, and evaluation days for follow-ups and medication management. Because of the amount of documentation required during both, they are kept separated. This means that there is the possibility that she may be scheduled for a procedure on one day, and medication management the next. I have also informed her that because of staffing and facility limitations, I no longer take patients for medication management only. To illustrate the reasons for this, I gave the patient the example of surgeons, and how inappropriate it would be to refer a patient to his/her care, just to write for the post-surgical antibiotics on a surgery done by a different surgeon.   Because interventional pain management is my board-certified specialty, the patient was informed that joining my practice means that they are open to any and all interventional therapies. I made it clear that this does not mean that they will be forced to have any procedures done. What this means is that I believe interventional therapies to be essential part of the diagnosis and proper management of chronic pain conditions. Therefore, patients not interested in these interventional alternatives will be better served under the care of a different practitioner.  The patient was also made aware of my Comprehensive Pain Management Safety Guidelines where by joining my practice, they limit  all of their nerve blocks and joint injections to those done by our practice, for as long as we are retained to manage their care.   Historic Controlled Substance Pharmacotherapy Review   02/23/2019  1   02/23/2019  Alprazolam 1 MG Tablet  30.00 30 [address] [facility]   [id number]   [place] ([id number])   0  2.00 LME  Comm Ins   [place]    Medications: The patient did not bring the medication(s) to the appointment, as requested in our "New Patient Package"  Side-effects or Adverse reactions: None reported Historical Monitoring: The patient  reports no history of drug use. List of all UDS Test(s): No results found for: MDMA, COCAINSCRNUR, [place], [place], CANNABQUANT, [place], [place] List of other Serum/Urine Drug Screening Test(s):  No results found for: AMPHSCRSER, BARBSCRSER, BENZOSCRSER, COCAINSCRSER, COCAINSCRNUR, PCPSCRSER, PCPQUANT, THCSCRSER, THCU, CANNABQUANT, OPIATESCRSER, OXYSCRSER, PROPOXSCRSER, ETH Historical Background Evaluation: [place] PMP: PDMP not reviewed this encounter. Six (6) year initial data search conducted.              [place] Department of public safety, offender search: Editor, commissioning Information) Non-contributory Risk Assessment Profile: Aberrant behavior: None observed or detected today Risk factors for fatal opioid overdose: None identified today Fatal overdose hazard ratio (HR): Calculation deferred Non-fatal overdose hazard ratio (HR): Calculation deferred Risk of opioid abuse or dependence: 0.7-3.0% with doses ? 36 MME/day and 6.1-26% with doses ? 120 MME/day. Substance use disorder (SUD) risk level: See below Personal History of Substance Abuse (SUD-Substance use disorder):  Alcohol: Negative  Illegal Drugs: Negative  Rx Drugs: Negative  ORT Risk Level calculation: Low Risk Opioid Risk Tool - 03/22/19 0813  Family History of Substance Abuse   Alcohol  Negative    Illegal Drugs  Negative    Rx Drugs  Negative      Personal History of Substance Abuse   Alcohol  Negative    Illegal Drugs   Negative    Rx Drugs  Negative      Age   Age between 45-45 years   No      History of Preadolescent Sexual Abuse   History of Preadolescent Sexual Abuse  Negative or Female      Psychological Disease   Psychological Disease  Negative    Depression  Negative      Total Score   Opioid Risk Tool Scoring  0    Opioid Risk Interpretation  Low Risk      ORT Scoring interpretation table:  Score <3 = Low Risk for SUD  Score between 4-7 = Moderate Risk for SUD  Score >8 = High Risk for Opioid Abuse   PHQ-2 Depression Scale:  Total score: 0  PHQ-2 Scoring interpretation table: (Score and probability of major depressive disorder)  Score 0 = No depression  Score 1 = 15.4% Probability  Score 2 = 21.1% Probability  Score 3 = 38.4% Probability  Score 4 = 45.5% Probability  Score 5 = 56.4% Probability  Score 6 = 78.6% Probability   PHQ-9 Depression Scale:  Total score: 0  PHQ-9 Scoring interpretation table:  Score 0-4 = No depression  Score 5-9 = Mild depression  Score 10-14 = Moderate depression  Score 15-19 = Moderately severe depression  Score 20-27 = Severe depression (2.4 times higher risk of SUD and 2.89 times higher risk of overuse)   Pharmacologic Plan: As per protocol, I have not taken over any controlled substance management, pending the results of ordered tests and/or consults.            Initial impression: Pending review of available data and ordered tests.  Meds   Current Outpatient Medications:  .  ALPRAZolam (XANAX) 0.5 MG tablet, Take 0.5 mg by mouth at bedtime. , Disp: , Rfl: 5 .  ALPRAZolam (XANAX) 1 MG tablet, TAKE 1 TABLET BY MOUTH NIGHTLY AS NEEDED FOR SLEEP, Disp: , Rfl:  .  cholecalciferol (VITAMIN D3) 25 MCG (1000 UT) tablet, Take 1,000 Units by mouth every 3 (three) days., Disp: , Rfl:  .  clobetasol cream (TEMOVATE) 0.96 %, Apply 1 application topically 2 (two) times daily. For 2 weeks then decrease to once daily at hs, Disp: 30 g, Rfl: 1 .   cyanocobalamin (,VITAMIN B-12,) 1000 MCG/ML injection, Inject into the muscle., Disp: , Rfl:  .  cyclobenzaprine (FLEXERIL) 10 MG tablet, Take 10 mg by mouth daily as needed for muscle spasms. , Disp: , Rfl:  .  ferrous sulfate 325 (65 FE) MG tablet, Take 325 mg by mouth 2 (two) times daily with a meal. , Disp: , Rfl: 3 .  gabapentin (NEURONTIN) 100 MG capsule, 100 mg qAM, 100 qPM, Disp: 60 capsule, Rfl: 5 .  lovastatin (MEVACOR) 40 MG tablet, Take 40 mg by mouth at bedtime. , Disp: , Rfl: 3 .  Multiple Vitamins-Minerals (AIRBORNE) CHEW, Chew 1 tablet by mouth daily., Disp: , Rfl:  .  pantoprazole (PROTONIX) 40 MG tablet, Take 40 mg by mouth daily. , Disp: , Rfl: 3 .  polyethylene glycol (MIRALAX / GLYCOLAX) packet, Take 17 g by mouth at bedtime., Disp: , Rfl:  .  sucralfate (CARAFATE) 1 g tablet, Take 1 g  by mouth 2 (two) times daily. , Disp: , Rfl:  .  amphetamine-dextroamphetamine (ADDERALL) 30 MG tablet, Take 30 mg by mouth 2 (two) times daily. , Disp: , Rfl:  .  diclofenac (VOLTAREN) 75 MG EC tablet, Take 1 tablet (75 mg total) by mouth 2 (two) times daily after a meal., Disp: 60 tablet, Rfl: 2 .  enoxaparin (LOVENOX) 40 MG/0.4ML injection, Inject 0.4 mLs (40 mg total) into the skin daily for 14 days., Disp: 14 Syringe, Rfl: 0 .  gabapentin (NEURONTIN) 300 MG capsule, Take 1 capsule (300 mg total) by mouth at bedtime., Disp: 90 capsule, Rfl: 2 .  hydrochlorothiazide (HYDRODIURIL) 25 MG tablet, Take 25 mg by mouth daily. , Disp: , Rfl: 11 .  potassium chloride (KLOR-CON) 20 MEQ packet, Take 20 mEq by mouth 2 (two) times daily., Disp: , Rfl:   Imaging Review   Lumbosacral Imaging: Lumbar MR wo contrast:  Results for orders placed during the hospital encounter of 09/23/17  MR LUMBAR SPINE WO CONTRAST   Narrative CLINICAL DATA:  Lumbar disc disease.  Back pain with right leg pain  EXAM: MRI LUMBAR SPINE WITHOUT CONTRAST  TECHNIQUE: Multiplanar, multisequence MR imaging of the lumbar  spine was performed. No intravenous contrast was administered.  COMPARISON:  Lumbar MRI 12/27/2008  FINDINGS: Segmentation: Normal segmentation. S1 is transitional. Lowest fully developed disc space L5-S1 consistent with the prior report.  Alignment: 8 mm anterolisthesis L5-S1 has developed since the prior MRI. Mild retrolisthesis L2-3 and L3-4.  Vertebrae:  Negative for fracture or mass  Conus medullaris and cauda equina: Conus extends to the L1-2 level. Conus and cauda equina appear normal.  Paraspinal and other soft tissues: Negative for mass or adenopathy  Disc levels:  Image quality degraded by motion particularly the axial images.  L1-2: Negative  L2-3: Mild disc bulging and mild facet degeneration without stenosis.  L3-4: Mild disc and facet degeneration without stenosis  L4-5: Disc bulging and facet degeneration with mild spinal stenosis.  L5-S1: 8 mm anterolisthesis due to severe facet degeneration which has progressed. Subarticular stenosis bilaterally with impingement of the S1 nerve root bilaterally. L5 nerve root appears to exit freely.  IMPRESSION: Progressive disc and facet degeneration L5-S1 with 8 mm anterolisthesis. Marked subarticular stenosis bilaterally with impingement of the S1 nerve roots bilaterally.  Mild degenerative changes elsewhere as above.   Electronically Signed   By: Franchot Gallo M.D.   On: 09/23/2017 10:24    Results for orders placed during the hospital encounter of 04/29/18  MR HIP RIGHT WO CONTRAST   Narrative CLINICAL DATA:  Chronic right hip and groin pain for the past year.  EXAM: MR OF THE RIGHT HIP WITHOUT CONTRAST  TECHNIQUE: Multiplanar, multisequence MR imaging was performed. No intravenous contrast was administered.  COMPARISON:  Right hip x-rays dated March 30, 2018.  FINDINGS: Bones: There is no evidence of acute fracture, dislocation or avascular necrosis. No focal bone lesion. The visualized  sacroiliac joints and symphysis pubis appear normal.  Articular cartilage and labrum  Articular cartilage: Large areas of full-thickness cartilage loss along the superior humeral head and acetabulum with underlying subchondral marrow edema.  Labrum: Tearing of the superior and anterior superior labrum at the chondrolabral junction.  Joint or bursal effusion  Joint effusion: No significant hip joint effusion.  Bursae: No focal periarticular fluid collection.  Muscles and tendons  Muscles and tendons: The visualized gluteus, hamstring and iliopsoas tendons appear normal. No muscle edema or atrophy.  Other findings  Miscellaneous: Sigmoid diverticulosis. The visualized internal pelvic contents otherwise appear unremarkable.  IMPRESSION: 1. Moderate to severe right hip osteoarthritis. 2. Anterior superior and superior labral tear at the chondrolabral junction.   Electronically Signed   By: Titus Dubin M.D.   On: 04/30/2018 08:07     Right lumbar MRI April 2020 found under care everywhere Degenerative changes as follows: T11-T12: Small disc bulge. Mild facet degenerative changes. No significant stenosis. T12-L1: Minimal disc bulge. No significant stenosis. L1-L2: No significant stenosis L2-L3: Small disc bulge. Mild facet degenerative changes. No significant stenosis. L3-L4: Small disc bulge. Mild facet degenerative changes. No significant stenosis. L4-L5: Small disc bulge. Facet degenerative changes with facet joint effusions. Small 5 mm synovial cyst associated with the medial left-sided facet joint capsule. No significant central canal stenosis. Minimal bilateral foraminal stenosis. L5-S1: Disc desiccation with small bulge. Advanced facet degenerative changes with small facet joint effusions. Degenerative anterolisthesis of L5 on S1 by 8 mm. Ligamentum flavum hypertrophy. Moderate central canal and bilateral foraminal stenosis.  Severe right and moderate left  subarticular/lateral recess stenosis.  Complexity Note: Imaging results reviewed. Results shared with Ms. Mabin, using Layman's terms.                         ROS  Cardiovascular: No reported cardiovascular signs or symptoms such as High blood pressure, coronary artery disease, abnormal heart rate or rhythm, heart attack, blood thinner therapy or heart weakness and/or failure Pulmonary or Respiratory: Snoring  Neurological: No reported neurological signs or symptoms such as seizures, abnormal skin sensations, urinary and/or fecal incontinence, being born with an abnormal open spine and/or a tethered spinal cord Review of Past Neurological Studies: No results found for this or any previous visit. Psychological-Psychiatric: No reported psychological or psychiatric signs or symptoms such as difficulty sleeping, anxiety, depression, delusions or hallucinations (schizophrenial), mood swings (bipolar disorders) or suicidal ideations or attempts Gastrointestinal: Reflux or heatburn Genitourinary: No reported renal or genitourinary signs or symptoms such as difficulty voiding or producing urine, peeing blood, non-functioning kidney, kidney stones, difficulty emptying the bladder, difficulty controlling the flow of urine, or chronic kidney disease Hematological: Weakness due to low blood hemoglobin or red blood cell count (Anemia) Endocrine: No reported endocrine signs or symptoms such as high or low blood sugar, rapid heart rate due to high thyroid levels, obesity or weight gain due to slow thyroid or thyroid disease Rheumatologic: Joint aches and or swelling due to excess weight (Osteoarthritis) Musculoskeletal: Negative for myasthenia gravis, muscular dystrophy, multiple sclerosis or malignant hyperthermia Work History: Working full time  Allergies  Ms. Rohwer has No Known Allergies.  Laboratory Chemistry   SAFETY SCREENING Profile Lab Results  Component Value Date   STAPHAUREUS NEGATIVE 07/28/2018    MRSAPCR NEGATIVE 07/28/2018   Inflammation Markers (CRP: Acute Phase) (ESR: Chronic Phase) Lab Results  Component Value Date   CRP <0.8 07/28/2018   ESRSEDRATE 8 07/28/2018                         Renal Function Markers Lab Results  Component Value Date   BUN 16 07/28/2018   CREATININE 0.73 08/11/2018   GFRAA >60 08/11/2018   GFRNONAA >60 08/11/2018                             Hepatic Function Markers Lab Results  Component Value Date   AST 16 07/28/2018  ALT 16 07/28/2018   ALBUMIN 4.1 07/28/2018   ALKPHOS 72 07/28/2018                        Electrolytes Lab Results  Component Value Date   NA 139 08/11/2018   K 3.9 08/11/2018   CL 103 07/28/2018   CALCIUM 9.1 07/28/2018                        Coagulation Parameters Lab Results  Component Value Date   INR 0.88 07/28/2018   LABPROT 11.9 07/28/2018   APTT 27 07/28/2018   PLT 217 08/11/2018                        Cardiovascular Markers Lab Results  Component Value Date   HGB 13.5 08/11/2018   HCT 40.3 08/11/2018                         ID Test(s) Lab Results  Component Value Date   STAPHAUREUS NEGATIVE 07/28/2018   MRSAPCR NEGATIVE 07/28/2018    CA Markers No results found for: CEA, CA125, LABCA2                      Endocrine Markers No results found for: TSH, FREET4, TESTOFREE, TESTOSTERONE, SHBG, ESTRADIOL, ESTRADIOLPCT, ESTRADIOLFRE, LABPREG, ACTH                      Note: Lab results reviewed.  PFSH  Drug: Ms. Bossi  reports no history of drug use. Alcohol:  reports current alcohol use. Tobacco:  reports that she quit smoking about 3 years ago. She has never used smokeless tobacco. Medical:  has a past medical history of ADD (attention deficit disorder), Anemia, Anxiety, Barrett's esophagus (2019), Colon polyps, COPD (chronic obstructive pulmonary disease) ([place]), Essential hypertension, GERD (gastroesophageal reflux disease), History of hiatal hernia, Hyperlipidemia, Lichen sclerosus,  Osteoarthritis, Other abnormal auditory perceptions, bilateral (2019), RLS (restless legs syndrome), and Vitamin B12 deficiency. Family: family history includes Alcoholism in her mother; Arthritis in her sister; Breast cancer (age of onset: 49) in her cousin; Breast cancer (age of onset: 33) in her paternal grandmother; Congestive Heart Failure in her mother; Diabetes in her maternal grandmother, mother, and sister; Hypertension in her mother; Liver cancer in her maternal aunt; Stroke (age of onset: 96) in her brother.  Past Surgical History:  Procedure Laterality Date  . BREAST CYST ASPIRATION Right 1998   neg  . broken leg repair Right   . BUNIONECTOMY Right 2010   pins in toes of right foot  . COLONOSCOPY  2014  . COLONOSCOPY WITH PROPOFOL N/A 05/24/2018   Procedure: COLONOSCOPY WITH PROPOFOL;  Surgeon: Lollie Sails, MD;  Location[facility] ENDOSCOPY;  Service: Endoscopy;  Laterality: N/A;  . ESOPHAGOGASTRODUODENOSCOPY (EGD) WITH PROPOFOL N/A 05/24/2018   Procedure: ESOPHAGOGASTRODUODENOSCOPY (EGD) WITH PROPOFOL;  Surgeon: Lollie Sails, MD;  Location[facility] ENDOSCOPY;  Service: Endoscopy;  Laterality: N/A;  . eye plugs Bilateral    plugs placed for dry eyes  . INDUCED ABORTION    . PLANTAR FASCIA SURGERY Left 2005  . TOTAL HIP ARTHROPLASTY Right 08/11/2018   Procedure: TOTAL HIP ARTHROPLASTY;  Surgeon: Dereck Leep, MD;  Location: [facility] ORS;  Service: Orthopedics;  Laterality: Right;   Active Ambulatory Problems    Diagnosis Date Noted  . Essential hypertension 02/08/2017  .  Hyperlipidemia 02/08/2017  . COPD (chronic obstructive pulmonary disease) ([place]) 02/08/2017  . Anemia 02/08/2017  . Vitamin B12 deficiency 02/08/2017  . Lichen sclerosus et atrophicus 02/08/2017  . Adenomatous colon polyp 08/11/2018  . Adult ADHD 01/31/2016  . Diverticulosis 08/11/2018  . Dyspnea on exertion 08/11/2018  . Environmental allergies 08/11/2018  . Ganglion of joint 08/11/2018  . Hiatal  hernia 08/11/2018  . Localized, primary osteoarthritis of hand 08/11/2018  . Obesity 08/11/2018  . Osteoarthritis of knee 08/11/2018  . Hypertension 08/11/2018  . B12 deficiency 08/11/2018  . H/O total hip arthroplasty 08/11/2018  . Lumbar radiculopathy (R L5/S1) 03/22/2019  . Chronic radicular lumbar pain 03/22/2019  . Lumbar facet arthropathy 03/22/2019  . Lumbar spondylosis 03/22/2019  . Spinal stenosis, lumbar region, with neurogenic claudication 03/22/2019  . Lumbar degenerative disc disease 03/22/2019  . Sacroiliac joint pain 03/22/2019  . Chronic pain syndrome 03/22/2019   Resolved Ambulatory Problems    Diagnosis Date Noted  . No Resolved Ambulatory Problems   Past Medical History:  Diagnosis Date  . ADD (attention deficit disorder)   . Anxiety   . Barrett's esophagus 2019  . Colon polyps   . GERD (gastroesophageal reflux disease)   . History of hiatal hernia   . Lichen sclerosus   . Osteoarthritis   . Other abnormal auditory perceptions, bilateral 2019  . RLS (restless legs syndrome)    Constitutional Exam  General appearance: Well nourished, well developed, and well hydrated. In no apparent acute distress Vitals:   03/22/19 0802 03/22/19 0804  BP:  129/73  Pulse:  77  Resp:  18  Temp:  98 F (36.7 C)  SpO2:  96%  Weight: 245 lb (111.1 kg)   Height: '5\' 7"'  (1.702 m)    BMI Assessment: Estimated body mass index is 38.37 kg/m as calculated from the following:   Height as of this encounter: '5\' 7"'  (1.702 m).   Weight as of this encounter: 245 lb (111.1 kg).  BMI interpretation table: BMI level Category Range association with higher incidence of chronic pain  <18 kg/m2 Underweight   18.5-24.9 kg/m2 Ideal body weight   25-29.9 kg/m2 Overweight Increased incidence by 20%  30-34.9 kg/m2 Obese (Class I) Increased incidence by 68%  35-39.9 kg/m2 Severe obesity (Class II) Increased incidence by 136%  >40 kg/m2 Extreme obesity (Class III) Increased incidence by  254%   Patient's current BMI Ideal Body weight  Body mass index is 38.37 kg/m. Ideal body weight: 61.6 kg (135 lb 12.9 oz) Adjusted ideal body weight: 81.4 kg (179 lb 7.7 oz)   BMI Readings from Last 4 Encounters:  03/22/19 38.37 kg/m  08/11/18 34.53 kg/m  07/28/18 34.53 kg/m  06/09/18 33.83 kg/m   Wt Readings from Last 4 Encounters:  03/22/19 245 lb (111.1 kg)  08/11/18 220 lb 7 oz (100 kg)  07/28/18 220 lb 7 oz (100 kg)  06/09/18 216 lb (98 kg)  Psych/Mental status: Alert, oriented x 3 (person, place, & time)       Eyes: PERLA Respiratory: No evidence of acute respiratory distress  Cervical Spine Area Exam  Skin & Axial Inspection: No masses, redness, edema, swelling, or associated skin lesions Alignment: Symmetrical Functional ROM: Unrestricted ROM      Stability: No instability detected Muscle Tone/Strength: Functionally intact. No obvious neuro-muscular anomalies detected. Sensory (Neurological): Unimpaired Palpation: No palpable anomalies              Thoracic Spine Area Exam  Skin & Axial Inspection: No  masses, redness, or swelling Alignment: Symmetrical Functional ROM: Decreased ROM Stability: No instability detected Muscle Tone/Strength: Functionally intact. No obvious neuro-muscular anomalies detected. Sensory (Neurological): Musculoskeletal pain pattern Muscle strength & Tone: No palpable anomalies  Lumbar Spine Area Exam  Skin & Axial Inspection: No masses, redness, or swelling Alignment: Asymmetric Functional ROM: Pain restricted ROM       Stability: No instability detected Muscle Tone/Strength: Functionally intact. No obvious neuro-muscular anomalies detected. Sensory (Neurological): Dermatomal pain pattern  Provocative Tests: Hyperextension/rotation test: Non-contributory       Lumbar quadrant test (Kemp's test): Non-contributory       Lateral bending test: Non-contributory       Patrick's Maneuver: deferred today                   FABER* test:  deferred today                   S-I anterior distraction/compression test: deferred today         S-I lateral compression test: deferred today         S-I Thigh-thrust test: deferred today         S-I Gaenslen's test: deferred today         *(Flexion, ABduction and External Rotation)  Gait & Posture Assessment  Ambulation: Patient ambulates using a cane Gait: Antalgic gait (limping) Posture: Difficulty standing up straight, due to pain   Lower Extremity Exam    Side: Right lower extremity  Side: Left lower extremity  Stability: No instability observed          Stability: No instability observed          Skin & Extremity Inspection: Skin color, temperature, and hair growth are WNL. No peripheral edema or cyanosis. No masses, redness, swelling, asymmetry, or associated skin lesions. No contractures.  Skin & Extremity Inspection: Skin color, temperature, and hair growth are WNL. No peripheral edema or cyanosis. No masses, redness, swelling, asymmetry, or associated skin lesions. No contractures.  Functional ROM: Decreased ROM for hip and knee joints          Functional ROM: Pain restricted ROM for hip and knee joints          Muscle Tone/Strength: Functionally intact. No obvious neuro-muscular anomalies detected.  Muscle Tone/Strength: Functionally intact. No obvious neuro-muscular anomalies detected.  Sensory (Neurological): Dermatomal pain pattern        Sensory (Neurological): Musculoskeletal pain pattern        DTR: Patellar: 1+: trace Achilles: deferred today Plantar: deferred today  DTR: Patellar: 1+: trace Achilles: deferred today Plantar: deferred today  Palpation: No palpable anomalies  Palpation: No palpable anomalies   Assessment  Primary Diagnosis & Pertinent Problem List: The primary encounter diagnosis was Lumbar radiculopathy (R L5/S1). Diagnoses of Chronic radicular lumbar pain, Lumbar facet arthropathy, Lumbar spondylosis, Spinal stenosis, lumbar region, with neurogenic  claudication, Lumbar degenerative disc disease, History of total right hip replacement, Chronic obstructive pulmonary disease, unspecified COPD type ([place]), Sacroiliac joint pain, and Chronic pain syndrome were also pertinent to this visit.  Visit Diagnosis (New problems to examiner): 1. Lumbar radiculopathy (R L5/S1)   2. Chronic radicular lumbar pain   3. Lumbar facet arthropathy   4. Lumbar spondylosis   5. Spinal stenosis, lumbar region, with neurogenic claudication   6. Lumbar degenerative disc disease   7. History of total right hip replacement   8. Chronic obstructive pulmonary disease, unspecified COPD type ([place])   9. Sacroiliac joint pain  10. Chronic pain syndrome    Plan of Care (Initial workup plan)  Note: Ms. Malmquist was reminded that as per protocol, today's visit has been an evaluation only. We have not taken over the patient's controlled substance management. General Recommendations: The pain condition that the patient suffers from is best treated with a multidisciplinary approach that involves an increase in physical activity to prevent de-conditioning and worsening of the pain cycle, as well as psychological counseling (formal and/or informal) to address the co-morbid psychological affects of pain. Treatment will often involve judicious use of pain medications and interventional procedures to decrease the pain, allowing the patient to participate in the physical activity that will ultimately produce long-lasting pain reductions. The goal of the multidisciplinary approach is to return the patient to a higher level of overall function and to restore their ability to perform activities of daily living.  63 year old female with a history of right hip replacement who presents with a chief complaint of low back pain with radiation down her right leg in a posterior lateral distribution and then towards the anterior aspect to her mid calf.  Her pain usually stays above her knee but on  occasion it does radiate to calf region.  Patient has completed injection therapy with physical medicine and rehab.  She had a right intra-articular hip injection as well as right S1 transforaminal ESI and L5-S1 transforaminal ESI which were not very effective.  Her last transforaminal epidural injection was performed in April 2019.  Patient's right hip replacement surgery was December 2019.  She states that her mobility has improved as a result of her hip replacement due to significant right hip osteoarthritis.  She continues to have difficulty with pain and weakness.  Her most recent MRI below shows significant pathology at L5-S1 including L5-S1 anterolisthesis of 8 mm, central canal stenosis, right greater than left neuroforaminal stenosis which is graded as severe on the right.  Patient has tried physical therapy in the past which he states was not effective.  She is currently on Celebrex 200 mg daily along with gabapentin 100 mg 3 times daily.  She does have generalized anxiety for which she is prescribed Xanax.  Regards to treatment plan, will obtain baseline urine toxicology screen.  Would like to avoid opioid therapy if possible and focus on non-opioid-based analgesics, behavioral modification as well as interventional-based therapies.  Reviewed recent MRI findings.  Given central canal and neuroforaminal stenosis recommend interlaminar epidural steroid injection at L5-S1.  Risks and benefits reviewed and patient would like to proceed.  Patient's pain could also be related to lumbar facet pathology as she does have facet agenic pain.  Could be candidate for lumbar facet medial branch nerve blocks which we will discuss in future.  In regards to medication management, recommend increasing patient's gabapentin so that she is taking 100 mg in the morning, 100 mg in the afternoon and 200 mg nightly.  Also recommend NSAID rotation to diclofenac 75 mg twice daily and will have the patient discontinue Celebrex  20 mg daily.   Lab Orders     Compliance Drug Analysis, Ur  Procedure Orders     Lumbar Epidural Injection Pharmacotherapy (current): Medications ordered:  Meds ordered this encounter  Medications  . diclofenac (VOLTAREN) 75 MG EC tablet    Sig: Take 1 tablet (75 mg total) by mouth 2 (two) times daily after a meal.    Dispense:  60 tablet    Refill:  2  . gabapentin (NEURONTIN) 100 MG  capsule    Sig: 100 mg qAM, 100 qPM    Dispense:  60 capsule    Refill:  5  . gabapentin (NEURONTIN) 300 MG capsule    Sig: Take 1 capsule (300 mg total) by mouth at bedtime.    Dispense:  90 capsule    Refill:  2   Medications administered during this visit: [name] had no medications administered during this visit.   Pharmacological management options:  Opioid Analgesics: The patient was informed that there is no guarantee that she would be a candidate for opioid analgesics. The decision will be made following CDC guidelines. This decision will be based on the results of diagnostic studies, as well as Ms. Mangual's risk profile.   Membrane stabilizer: Gabapentin 100 mg every morning, 100 mg every afternoon, 200 mg nightly consider Lyrica or Cymbalta in future  Muscle relaxant: To be determined at a later time  NSAID: Rotation from Celebrex 200 mg daily to Diclofenac 29m BID  Other analgesic(s): To be determined at a later time   Interventional management options: Ms. BNoawas informed that there is no guarantee that she would be a candidate for interventional therapies. The decision will be based on the results of diagnostic studies, as well as Ms. Hammen's risk profile.  Procedure(s) under consideration:  L5-S1 ESI L4 L5-S1 lumbar facet medial branch nerve blocks Right sacroiliac joint injection    Provider-requested follow-up: Return for Procedure right L5-S1 ESI without sedation.  Future Appointments  Date Time Provider [place] 03/30/2019  8:45 AM LGillis Santa MD  [facility]    Primary Care Physician: SIdelle Crouch MD [facility] Note by: BGillis Santa MD Date: 03/22/2019; Time: 9:03 AM  Note: This dictation was prepared with Dragon dictation. Any transcriptional errors that may result from this process are unintentional.

## 2019-03-22 ENCOUNTER — Ambulatory Visit
Payer: BC Managed Care – PPO | Attending: Student in an Organized Health Care Education/Training Program | Admitting: Student in an Organized Health Care Education/Training Program

## 2019-03-22 ENCOUNTER — Encounter: Payer: Self-pay | Admitting: Student in an Organized Health Care Education/Training Program

## 2019-03-22 ENCOUNTER — Other Ambulatory Visit: Payer: Self-pay

## 2019-03-22 VITALS — BP 129/73 | HR 77 | Temp 98.0°F | Resp 18 | Ht 67.0 in | Wt 245.0 lb

## 2019-03-22 DIAGNOSIS — J449 Chronic obstructive pulmonary disease, unspecified: Secondary | ICD-10-CM | POA: Diagnosis present

## 2019-03-22 DIAGNOSIS — M48062 Spinal stenosis, lumbar region with neurogenic claudication: Secondary | ICD-10-CM | POA: Diagnosis not present

## 2019-03-22 DIAGNOSIS — G894 Chronic pain syndrome: Secondary | ICD-10-CM | POA: Diagnosis not present

## 2019-03-22 DIAGNOSIS — G8929 Other chronic pain: Secondary | ICD-10-CM | POA: Diagnosis present

## 2019-03-22 DIAGNOSIS — Z96641 Presence of right artificial hip joint: Secondary | ICD-10-CM | POA: Diagnosis present

## 2019-03-22 DIAGNOSIS — M533 Sacrococcygeal disorders, not elsewhere classified: Secondary | ICD-10-CM

## 2019-03-22 DIAGNOSIS — M5416 Radiculopathy, lumbar region: Secondary | ICD-10-CM | POA: Diagnosis present

## 2019-03-22 DIAGNOSIS — M5136 Other intervertebral disc degeneration, lumbar region: Secondary | ICD-10-CM | POA: Insufficient documentation

## 2019-03-22 DIAGNOSIS — M47816 Spondylosis without myelopathy or radiculopathy, lumbar region: Secondary | ICD-10-CM | POA: Insufficient documentation

## 2019-03-22 DIAGNOSIS — M51369 Other intervertebral disc degeneration, lumbar region without mention of lumbar back pain or lower extremity pain: Secondary | ICD-10-CM

## 2019-03-22 MED ORDER — GABAPENTIN 300 MG PO CAPS: 300.0000 mg | ORAL_CAPSULE | Freq: Every day | ORAL | 2 refills | Status: DC

## 2019-03-22 MED ORDER — GABAPENTIN 100 MG PO CAPS: ORAL_CAPSULE | ORAL | 5 refills | Status: DC

## 2019-03-22 MED ORDER — DICLOFENAC SODIUM 75 MG PO TBEC: 75.0000 mg | DELAYED_RELEASE_TABLET | Freq: Two times a day (BID) | ORAL | 2 refills | Status: DC

## 2019-03-22 NOTE — Progress Notes (Signed)
Safety precautions to be maintained throughout the outpatient stay will include: orient to surroundings, keep bed in low position, maintain call bell within reach at all times, provide assistance with transfer out of bed and ambulation.  

## 2019-03-22 NOTE — Patient Instructions (Signed)

## 2019-03-25 ENCOUNTER — Other Ambulatory Visit: Admission: RE | Admit: 2019-03-25 | Payer: BC Managed Care – PPO | Source: Ambulatory Visit

## 2019-03-25 LAB — COMPLIANCE DRUG ANALYSIS, UR

## 2019-03-28 ENCOUNTER — Encounter: Payer: Self-pay | Admitting: Student in an Organized Health Care Education/Training Program

## 2019-03-30 ENCOUNTER — Other Ambulatory Visit: Payer: Self-pay

## 2019-03-30 ENCOUNTER — Ambulatory Visit
Admission: RE | Admit: 2019-03-30 | Discharge: 2019-03-30 | Disposition: A | Discharge status: Home / Self Care | Payer: BC Managed Care – PPO | Source: Ambulatory Visit | Attending: Student in an Organized Health Care Education/Training Program | Admitting: Student in an Organized Health Care Education/Training Program

## 2019-03-30 ENCOUNTER — Encounter: Payer: Self-pay | Admitting: Student in an Organized Health Care Education/Training Program

## 2019-03-30 ENCOUNTER — Ambulatory Visit (HOSPITAL_BASED_OUTPATIENT_CLINIC_OR_DEPARTMENT_OTHER): Payer: BC Managed Care – PPO | Admitting: Student in an Organized Health Care Education/Training Program

## 2019-03-30 DIAGNOSIS — M5416 Radiculopathy, lumbar region: Secondary | ICD-10-CM

## 2019-03-30 MED ORDER — ROPIVACAINE HCL 2 MG/ML IJ SOLN
INTRAMUSCULAR | Status: AC
  Filled 2019-03-30: qty 10

## 2019-03-30 MED ORDER — SODIUM CHLORIDE (PF) 0.9 % IJ SOLN
INTRAMUSCULAR | Status: AC
  Filled 2019-03-30: qty 10

## 2019-03-30 MED ORDER — ROPIVACAINE HCL 2 MG/ML IJ SOLN
1.0000 mL | Freq: Once | INTRAMUSCULAR | Status: AC
  Administered 2019-03-30: 1 mL via EPIDURAL

## 2019-03-30 MED ORDER — SODIUM CHLORIDE 0.9% FLUSH
1.0000 mL | Freq: Once | INTRAVENOUS | Status: AC
  Administered 2019-03-30: 1 mL

## 2019-03-30 MED ORDER — DEXAMETHASONE SODIUM PHOSPHATE 10 MG/ML IJ SOLN
10.0000 mg | Freq: Once | INTRAMUSCULAR | Status: AC
  Administered 2019-03-30: 10 mg

## 2019-03-30 MED ORDER — DEXAMETHASONE SODIUM PHOSPHATE 10 MG/ML IJ SOLN
INTRAMUSCULAR | Status: AC
  Filled 2019-03-30: qty 1

## 2019-03-30 MED ORDER — IOHEXOL 180 MG/ML  SOLN
10.0000 mL | Freq: Once | INTRAMUSCULAR | Status: AC
  Administered 2019-03-30: 10 mL via EPIDURAL

## 2019-03-30 MED ORDER — LIDOCAINE HCL 2 % IJ SOLN
20.0000 mL | Freq: Once | INTRAMUSCULAR | Status: AC
  Administered 2019-03-30: 400 mg

## 2019-03-30 MED ORDER — LIDOCAINE HCL 2 % IJ SOLN
INTRAMUSCULAR | Status: AC
  Filled 2019-03-30: qty 20

## 2019-03-30 NOTE — Progress Notes (Signed)
Patient's Name: Mackenzie [name]  MRN: [id number]  Referring Provider: Gillis Santa, MD  DOB: 12-14-1955  PCP: Idelle Crouch, MD  DOS: 03/30/2019  Note by: Gillis Santa, MD  Service setting: Ambulatory outpatient  Specialty: Interventional Pain Management  Patient type: Established  Location: [facility] (AMB) Pain Management Facility  Visit type: Interventional Procedure   Primary Reason for Visit: Interventional Pain Management Treatment. CC: Back Pain (lumbar left ) and Hip Pain  Procedure:          Anesthesia, Analgesia, Anxiolysis:  Type: Diagnostic Inter-Laminar Epidural Steroid Injection  #1  Region: Lumbar Level: L4-5 Level. Laterality: Right-Sided         Type: Local Anesthesia  Local Anesthetic: Lidocaine 1-2%  Position: Prone with head of the table was raised to facilitate breathing.   Indications: 1. Lumbar radiculopathy (R L5/S1)    Pain Score: Pre-procedure: 0-No pain(sitting still pain is fine,  when getting up and moving pain can increase to 6)/10 Post-procedure: 0-No pain(sitting still pain is fine,  when getting up and moving pain can increase to 6)/10   Pre-op Assessment:  Mackenzie [name] is a 63 y.o. (year old), female patient, seen today for interventional treatment. 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 Mackenzie [name] has a current medication list which includes the following prescription(s): alprazolam, alprazolam, cholecalciferol, clobetasol cream, cyanocobalamin, cyclobenzaprine, diclofenac, ferrous sulfate, gabapentin, gabapentin, lovastatin, pantoprazole, polyethylene glycol, sucralfate, amphetamine-dextroamphetamine, enoxaparin,  hydrochlorothiazide, airborne, and potassium chloride. Her primarily concern today is the Back Pain (lumbar left ) and Hip Pain  Initial Vital Signs:  Pulse/HCG Rate: 68  Temp: 97.7 F (36.5 C) Resp: 16 BP: 113/67 SpO2: 97 %  BMI: Estimated body mass index is 38.37 kg/m as calculated from the following:   Height as of this encounter: 5\' 7"  (1.702 m).   Weight as of this encounter: 245 lb (111.1 kg).  Risk Assessment: Allergies: Reviewed. She has No Known Allergies.  Allergy Precautions: None required Coagulopathies: Reviewed. None identified.  Blood-thinner therapy: None at this time Active Infection(s): Reviewed. None identified. Mackenzie [name] is afebrile  Site Confirmation: Mackenzie [name] was asked to confirm the procedure and laterality before marking the site Procedure checklist: Completed Consent: Before the procedure and under the influence of no sedative(s), amnesic(s), or anxiolytics, the patient was informed of the treatment options, risks and possible complications. To fulfill our ethical and legal obligations, as recommended by the American Medical Association's Code of Ethics, I have informed the patient of my clinical impression; the nature and purpose of the treatment or procedure; the risks, benefits, and possible complications of the intervention; the alternatives, including doing nothing; the risk(s) and benefit(s) of the alternative treatment(s) or procedure(s); and the risk(s) and benefit(s) of doing nothing. The patient was provided information about the general risks and possible complications associated with the procedure. These may include, but are not limited to: failure to achieve desired goals, infection, bleeding, organ or nerve damage, allergic reactions, paralysis, and death. In addition, the patient was informed of those risks and complications associated to Spine-related procedures, such as failure to decrease pain; infection (i.e.: Meningitis, epidural or intraspinal  abscess); bleeding (i.e.: epidural hematoma, subarachnoid hemorrhage, or any other type of intraspinal or peri-dural bleeding); organ or nerve damage (  i.e.: Any type of peripheral nerve, nerve root, or spinal cord injury) with subsequent damage to sensory, motor, and/or autonomic systems, resulting in permanent pain, numbness, and/or weakness of one or several areas of the body; allergic reactions; (i.e.: anaphylactic reaction); and/or death. Furthermore, the patient was informed of those risks and complications associated with the medications. These include, but are not limited to: allergic reactions (i.e.: anaphylactic or anaphylactoid reaction(s)); adrenal axis suppression; blood sugar elevation that in diabetics may result in ketoacidosis or comma; water retention that in patients with history of congestive heart failure may result in shortness of breath, pulmonary edema, and decompensation with resultant heart failure; weight gain; swelling or edema; medication-induced neural toxicity; particulate matter embolism and blood vessel occlusion with resultant organ, and/or nervous system infarction; and/or aseptic necrosis of one or more joints. Finally, the patient was informed that Medicine is not an exact science; therefore, there is also the possibility of unforeseen or unpredictable risks and/or possible complications that may result in a catastrophic outcome. The patient indicated having understood very clearly. We have given the patient no guarantees and we have made no promises. Enough time was given to the patient to ask questions, all of which were answered to the patient's satisfaction. Mackenzie [name] has indicated that she wanted to continue with the procedure. Attestation: I, the ordering provider, attest that I have discussed with the patient the benefits, risks, side-effects, alternatives, likelihood of achieving goals, and potential problems during recovery for the procedure that I have provided  informed consent. Date  Time: 03/30/2019  8:36 AM  Pre-Procedure Preparation:  Monitoring: As per clinic protocol. Respiration, ETCO2, SpO2, BP, heart rate and rhythm monitor placed and checked for adequate function Safety Precautions: Patient was assessed for positional comfort and pressure points before starting the procedure. Time-out: I initiated and conducted the "Time-out" before starting the procedure, as per protocol. The patient was asked to participate by confirming the accuracy of the "Time Out" information. Verification of the correct person, site, and procedure were performed and confirmed by me, the nursing staff, and the patient. "Time-out" conducted as per Joint Commission's Universal Protocol (UP.01.01.01). Time: 0910  Description of Procedure:          Target Area: The interlaminar space, initially targeting the lower laminar border of the superior vertebral body. Approach: Paramedial approach. Area Prepped: Entire Posterior Lumbar Region Prepping solution: DuraPrep (Iodine Povacrylex [0.7% available iodine] and Isopropyl Alcohol, 74% w/w) Safety Precautions: Aspiration looking for blood return was conducted prior to all injections. At no point did we inject any substances, as a needle was being advanced. No attempts were made at seeking any paresthesias. Safe injection practices and needle disposal techniques used. Medications properly checked for expiration dates. SDV (single dose vial) medications used. Description of the Procedure: Protocol guidelines were followed. The procedure needle was introduced through the skin, ipsilateral to the reported pain, and advanced to the target area. Bone was contacted and the needle walked caudad, until the lamina was cleared. The epidural space was identified using "loss-of-resistance technique" with 2-3 ml of PF-NaCl (0.9% NSS), in a 5cc LOR glass syringe.  Vitals:   03/30/19 0848 03/30/19 0905 03/30/19 0915  BP: 113/67 136/89 131/74   Pulse: 68 64 (!) 6  Resp: 16 16 16   Temp: 97.7 F (36.5 C)    TempSrc: Oral    SpO2: 97% 98% 100%  Weight: 245 lb (111.1 kg)    Height: 5\' 7"  (1.702 m)      Start  Time: 0910 hrs. End Time: 0920 hrs.  Materials:  Needle(s) Type: Epidural needle Gauge: 17G Length: 3.5-in Medication(s): Please see orders for medications and dosing details. 8 cc solution made of 5 cc of preservative-free saline, 2 cc of 0.2% ropivacaine, 1 cc of Decadron 10 mg/cc.  Imaging Guidance (Spinal):          Type of Imaging Technique: Fluoroscopy Guidance (Spinal) Indication(s): Assistance in needle guidance and placement for procedures requiring needle placement in or near specific anatomical locations not easily accessible without such assistance. Exposure Time: Please see nurses notes. Contrast: Before injecting any contrast, we confirmed that the patient did not have an allergy to iodine, shellfish, or radiological contrast. Once satisfactory needle placement was completed at the desired level, radiological contrast was injected. Contrast injected under live fluoroscopy. No contrast complications. See chart for type and volume of contrast used. Fluoroscopic Guidance: I was personally present during the use of fluoroscopy. "Tunnel Vision Technique" used to obtain the best possible view of the target area. Parallax error corrected before commencing the procedure. "Direction-depth-direction" technique used to introduce the needle under continuous pulsed fluoroscopy. Once target was reached, antero-posterior, oblique, and lateral fluoroscopic projection used confirm needle placement in all planes. Images permanently stored in EMR. Interpretation: I personally interpreted the imaging intraoperatively. Adequate needle placement confirmed in multiple planes. Appropriate spread of contrast into desired area was observed. No evidence of afferent or efferent intravascular uptake. No intrathecal or subarachnoid spread  observed. Permanent images saved into the patient's record.  Antibiotic Prophylaxis:   Anti-infectives (From admission, onward)   None     Indication(s): None identified  Post-operative Assessment:  Post-procedure Vital Signs:  Pulse/HCG Rate: (!) 6  Temp: 97.7 F (36.5 C) Resp: 16 BP: 131/74 SpO2: 100 %  EBL: None  Complications: No immediate post-treatment complications observed by team, or reported by patient.  Note: The patient tolerated the entire procedure well. A repeat set of vitals were taken after the procedure and the patient was kept under observation following institutional policy, for this type of procedure. Post-procedural neurological assessment was performed, showing return to baseline, prior to discharge. The patient was provided with post-procedure discharge instructions, including a section on how to identify potential problems. Should any problems arise concerning this procedure, the patient was given instructions to immediately contact us, at any time, without hesitation. In any case, we plan to contact the patient by telephone for a follow-up status report regarding this interventional procedure.  Comments:  No additional relevant information.  Plan of Care  Orders:  Orders Placed This Encounter  Procedures  . DG PAIN CLIN[facility] C-ARM 1-60 MIN NO REPORT    Intraoperative interpretation by procedural physician at [place].    Standing Status:   Standing    Number of Occurrences:   1    Order Specific Question:   Reason for exam:    Answer:   Assistance in needle guidance and placement for procedures requiring needle placement in or near specific anatomical locations not easily accessible without such assistance.    Medications ordered for procedure: Meds ordered this encounter  Medications  . iohexol (OMNIPAQUE) 180 MG/ML injection 10 mL    Must be Myelogram-compatible. If not available, you may substitute with a water-soluble, non-ionic,  hypoallergenic, myelogram-compatible radiological contrast medium.  Marland Kitchen lidocaine (XYLOCAINE) 2 % (with pres) injection 400 mg  . sodium chloride flush (NS) 0.9 % injection 1 mL  . ropivacaine (PF) 2 mg/mL (0.2%) (NAROPIN) injection 1 mL  .  dexamethasone (DECADRON) injection 10 mg   Medications administered: We administered iohexol, lidocaine, sodium chloride flush, ropivacaine (PF) 2 mg/mL (0.2%), and dexamethasone.  See the medical record for exact dosing, route, and time of administration.  Follow-up plan:   Return in about 4 weeks (around 04/27/2019) for Post Procedure Evaluation, virtual.         Recent Visits Date Type Provider Dept  03/22/19 Office Visit Gillis Santa, MD [facility]  Showing recent visits within past 90 days and meeting all other requirements   Today's Visits Date Type Provider Dept  03/30/19 Procedure visit Gillis Santa, MD [facility]  Showing today's visits and meeting all other requirements   Future Appointments Date Type Provider Dept  04/26/19 Appointment Gillis Santa, MD [facility]  Showing future appointments within next 90 days and meeting all other requirements   Disposition: Discharge home  Discharge Date & Time: 03/30/2019; 0923 hrs.   Primary Care Physician: Idelle Crouch, MD Location: [facility] Note by: Gillis Santa, MD Date: 03/30/2019; Time: 10:09 AM  Disclaimer:  Medicine is not an exact science. The only guarantee in medicine is that nothing is guaranteed. It is important to note that the decision to proceed with this intervention was based on the information collected from the patient. The Data and conclusions were drawn from the patient's questionnaire, the interview, and the physical examination. Because the information was provided in large part by the patient, it cannot be guaranteed that it has not been purposely or unconsciously manipulated. Every effort has been made  to obtain as much relevant data as possible for this evaluation. It is important to note that the conclusions that lead to this procedure are derived in large part from the available data. Always take into account that the treatment will also be dependent on availability of resources and existing treatment guidelines, considered by other Pain Management Practitioners as being common knowledge and practice, at the time of the intervention. For Medico-Legal purposes, it is also important to point out that variation in procedural techniques and pharmacological choices are the acceptable norm. The indications, contraindications, technique, and results of the above procedure should only be interpreted and judged by a Board-Certified Interventional Pain Specialist with extensive familiarity and expertise in the same exact procedure and technique.

## 2019-03-30 NOTE — Patient Instructions (Signed)
____________________________________________________________________________________________  Post-Procedure Discharge Instructions  Instructions:  Apply ice:   Purpose: This will minimize any swelling and discomfort after procedure.   When: Day of procedure, as soon as you get home.  How: Fill a plastic sandwich bag with crushed ice. Cover it with a small towel and apply to injection site.  How long: (15 min on, 15 min off) Apply for 15 minutes then remove x 15 minutes.  Repeat sequence on day of procedure, until you go to bed.  Apply heat:   Purpose: To treat any soreness and discomfort from the procedure.  When: Starting the next day after the procedure.  How: Apply heat to procedure site starting the day following the procedure.  How long: May continue to repeat daily, until discomfort goes away.  Food intake: Start with clear liquids (like water) and advance to regular food, as tolerated.   Physical activities: Keep activities to a minimum for the first 8 hours after the procedure. After that, then as tolerated.  Driving: If you have received any sedation, be responsible and do not drive. You are not allowed to drive for 24 hours after having sedation.  Blood thinner: (Applies only to those taking blood thinners) You may restart your blood thinner 6 hours after your procedure.  Insulin: (Applies only to Diabetic patients taking insulin) As soon as you can eat, you may resume your normal dosing schedule.  Infection prevention: Keep procedure site clean and dry. Shower daily and clean area with soap and water.  Post-procedure Pain Diary: Extremely important that this be done correctly and accurately. Recorded information will be used to determine the next step in treatment. For the purpose of accuracy, follow these rules:  Evaluate only the area treated. Do not report or include pain from an untreated area. For the purpose of this evaluation, ignore all other areas of pain,  except for the treated area.  After your procedure, avoid taking a long nap and attempting to complete the pain diary after you wake up. Instead, set your alarm clock to go off every hour, on the hour, for the initial 8 hours after the procedure. Document the duration of the numbing medicine, and the relief you are getting from it.  Do not go to sleep and attempt to complete it later. It will not be accurate. If you received sedation, it is likely that you were given a medication that may cause amnesia. Because of this, completing the diary at a later time may cause the information to be inaccurate. This information is needed to plan your care.  Follow-up appointment: Keep your post-procedure follow-up evaluation appointment after the procedure (usually 2 weeks for most procedures, 6 weeks for radiofrequencies). DO NOT FORGET to bring you pain diary with you.   Expect: (What should I expect to see with my procedure?)  From numbing medicine (AKA: Local Anesthetics): Numbness or decrease in pain. You may also experience some weakness, which if present, could last for the duration of the local anesthetic.  Onset: Full effect within 15 minutes of injected.  Duration: It will depend on the type of local anesthetic used. On the average, 1 to 8 hours.   From steroids (Applies only if steroids were used): Decrease in swelling or inflammation. Once inflammation is improved, relief of the pain will follow.  Onset of benefits: Depends on the amount of swelling present. The more swelling, the longer it will take for the benefits to be seen. In some cases, up to 10 days.    Duration: Steroids will stay in the system x 2 weeks. Duration of benefits will depend on multiple posibilities including persistent irritating factors.  Side-effects: If present, they may typically last 2 weeks (the duration of the steroids).  Frequent: Cramps (if they occur, drink Gatorade and take over-the-counter Magnesium 450-500 mg  once to twice a day); water retention with temporary weight gain; increases in blood sugar; decreased immune system response; increased appetite.  Occasional: Facial flushing (red, warm cheeks); mood swings; menstrual changes.  Uncommon: Long-term decrease or suppression of natural hormones; bone thinning. (These are more common with higher doses or more frequent use. This is why we prefer that our patients avoid having any injection therapies in other practices.)   Very Rare: Severe mood changes; psychosis; aseptic necrosis.  From procedure: Some discomfort is to be expected once the numbing medicine wears off. This should be minimal if ice and heat are applied as instructed.  Call if: (When should I call?)  You experience numbness and weakness that gets worse with time, as opposed to wearing off.  New onset bowel or bladder incontinence. (Applies only to procedures done in the spine)  Emergency Numbers:  Durning business hours (Monday - Thursday, 8:00 AM - 4:00 PM) (Friday, 9:00 AM - 12:00 Noon): [phone]  After hours: [phone]  NOTE: If you are having a problem and are unable connect with, or to talk to a provider, then go to your nearest urgent care or emergency department. If the problem is serious and urgent, please call 911. ____________________________________________________________________________________________   Epidural Steroid Injection An epidural steroid injection is a shot of steroid medicine and numbing medicine that is given into the space between the spinal cord and the bones in your back (epidural space). The shot helps relieve pain caused by an irritated or swollen nerve root. The amount of pain relief you get from the injection depends on what is causing the nerve to be swollen and irritated, and how long your pain lasts. You are more likely to benefit from this injection if your pain is strong and comes on suddenly rather than if you have had pain for  a long time. Tell a health care provider about:   Any allergies you have.  All medicines you are taking, including vitamins, herbs, eye drops, creams, and over-the-counter medicines.  Any problems you or family members have had with anesthetic medicines.  Any blood disorders you have.  Any surgeries you have had.  Any medical conditions you have.  Whether you are pregnant or may be pregnant. What are the risks? Generally, this is a safe procedure. However, problems may occur, including:  Headache.  Bleeding.  Infection.  Allergic reaction to medicines.  Damage to your nerves. What happens before the procedure? Staying hydrated Follow instructions from your health care provider about hydration, which may include:  Up to 2 hours before the procedure - you may continue to drink clear liquids, such as water, clear fruit juice, black coffee, and plain tea. Eating and drinking restrictions Follow instructions from your health care provider about eating and drinking, which may include:  8 hours before the procedure - stop eating heavy meals or foods such as meat, fried foods, or fatty foods.  6 hours before the procedure - stop eating light meals or foods, such as toast or cereal.  6 hours before the procedure - stop drinking milk or drinks that contain milk.  2 hours before the procedure - stop drinking clear liquids. Medicine  You may be given medicines to lower anxiety.  Ask your health care provider about: ? Changing or stopping your regular medicines. This is especially important if you are taking diabetes medicines or blood thinners. ? Taking medicines such as aspirin and ibuprofen. These medicines can thin your blood. Do not take these medicines before your procedure if your health care provider instructs you not to. General instructions  Plan to have someone take you home from the hospital or clinic. What happens during the procedure?  You may receive a  medicine to help you relax (sedative).  You will be asked to lie on your abdomen.  The injection site will be cleaned.  A numbing medicine (local anesthetic) will be used to numb the injection site.  A needle will be inserted through your skin into the epidural space. You may feel some discomfort when this happens. An X-ray machine will be used to make sure the needle is put as close as possible to the affected nerve.  A steroid medicine and a local anesthetic will be injected into the epidural space.  The needle will be removed.  A bandage (dressing) will be put over the injection site. What happens after the procedure?  Your blood pressure, heart rate, breathing rate, and blood oxygen level will be monitored until the medicines you were given have worn off.  Your arm or leg may feel weak or numb for a few hours.  The injection site may feel sore.  Do not drive for 24 hours if you received a sedative. This information is not intended to replace advice given to you by your health care provider. Make sure you discuss any questions you have with your health care provider. Document Released: 12/02/2007 Document Revised: 08/07/2017 Document Reviewed: 12/11/2015 Elsevier Patient Education  2020 [employer].

## 2019-03-30 NOTE — Progress Notes (Signed)
Safety precautions to be maintained throughout the outpatient stay will include: orient to surroundings, keep bed in low position, maintain call bell within reach at all times, provide assistance with transfer out of bed and ambulation.  

## 2019-03-31 ENCOUNTER — Telehealth: Payer: Self-pay

## 2019-03-31 NOTE — Telephone Encounter (Signed)
No answer left message to call if needed. 

## 2019-04-25 ENCOUNTER — Encounter: Payer: Self-pay | Admitting: Student in an Organized Health Care Education/Training Program

## 2019-04-26 ENCOUNTER — Ambulatory Visit
Payer: BC Managed Care – PPO | Attending: Student in an Organized Health Care Education/Training Program | Admitting: Student in an Organized Health Care Education/Training Program

## 2019-04-26 ENCOUNTER — Other Ambulatory Visit: Payer: Self-pay

## 2019-04-26 ENCOUNTER — Encounter: Payer: Self-pay | Admitting: Student in an Organized Health Care Education/Training Program

## 2019-04-26 DIAGNOSIS — G894 Chronic pain syndrome: Secondary | ICD-10-CM

## 2019-04-26 DIAGNOSIS — M5136 Other intervertebral disc degeneration, lumbar region: Secondary | ICD-10-CM | POA: Diagnosis not present

## 2019-04-26 DIAGNOSIS — M47816 Spondylosis without myelopathy or radiculopathy, lumbar region: Secondary | ICD-10-CM

## 2019-04-26 DIAGNOSIS — Z96641 Presence of right artificial hip joint: Secondary | ICD-10-CM

## 2019-04-26 DIAGNOSIS — M533 Sacrococcygeal disorders, not elsewhere classified: Secondary | ICD-10-CM

## 2019-04-26 DIAGNOSIS — M48062 Spinal stenosis, lumbar region with neurogenic claudication: Secondary | ICD-10-CM

## 2019-04-26 MED ORDER — DULOXETINE HCL 30 MG PO CPEP: 30.0000 mg | ORAL_CAPSULE | Freq: Every day | ORAL | 1 refills | Status: DC

## 2019-04-26 NOTE — Progress Notes (Signed)
Pain Management Virtual Encounter Note - Virtual Visit via [place] (real-time audio visits between healthcare provider and patient).   Patient's Phone No. & Preferred Pharmacy:  [phone] (home); [phone] (mobile); (Preferred) [phone] [email or web address]  [id number] [address], [place] [address] Phone: [phone] Fax: [phone]    Pre-screening note:  Our staff contacted Mackenzie [name] and offered her an "in person", "face-to-face" appointment versus a telephone encounter. She indicated preferring the telephone encounter, at this time.   Reason for Virtual Visit: COVID-19*  Social distancing based on CDC and AMA recommendations.   I contacted Mackenzie [name] on 04/26/2019 via video conference.      I clearly identified myself as Gillis Santa, MD. I verified that I was speaking with the correct person using two identifiers (Name: Mackenzie [name], and date of birth: [birth year]).  Advanced Informed Consent I sought verbal advanced consent from Mackenzie [name] for virtual visit interactions. I informed Mackenzie [name] of possible security and privacy concerns, risks, and limitations associated with providing "not-in-person" medical evaluation and management services. I also informed Mackenzie [name] of the availability of "in-person" appointments. Finally, I informed her that there would be a charge for the virtual visit and that she could be  personally, fully or partially, financially responsible for it. Mackenzie [name] expressed understanding and agreed to proceed.   Historic Elements   Mackenzie [name] is a 63 y.o. year old, female patient evaluated today after her last encounter by our practice on 03/31/2019. Mackenzie [name]  has a past medical history of ADD (attention deficit disorder), Anemia, Anxiety, Barrett's esophagus (2019), Colon polyps, COPD (chronic obstructive pulmonary disease) ([place]), Essential hypertension, GERD  (gastroesophageal reflux disease), History of hiatal hernia, Hyperlipidemia, Lichen sclerosus, Osteoarthritis, Other abnormal auditory perceptions, bilateral (2019), RLS (restless legs syndrome), and Vitamin B12 deficiency. She also  has a past surgical history that includes Bunionectomy (Right, 2010); Colonoscopy (2014); broken leg repair (Right); Colonoscopy with propofol (N/A, 05/24/2018); Esophagogastroduodenoscopy (egd) with propofol (N/A, 05/24/2018); Induced abortion; Plantar fascia surgery (Left, 2005); Breast cyst aspiration (Right, 1998); eye plugs (Bilateral); and Total hip arthroplasty (Right, 08/11/2018). Mackenzie [name] has a current medication list which includes the following prescription(s): alprazolam, cholecalciferol, cyanocobalamin, cyclobenzaprine, diclofenac, ferrous sulfate, gabapentin, gabapentin, lovastatin, melatonin, pantoprazole, polyethylene glycol, sucralfate, alprazolam, amphetamine-dextroamphetamine, clobetasol cream, duloxetine, enoxaparin, hydrochlorothiazide, airborne, and potassium chloride. She  reports that she quit smoking about 3 years ago. She has never used smokeless tobacco. She reports current alcohol use. She reports that she does not use drugs. Mackenzie [name] has No Known Allergies.   HPI  Today, she is being contacted for a post-procedure assessment.  Evaluation of last interventional procedure  03/30/2019 Procedure: RIGHT L4-5 ESI #1  Influential Factors: Intra-procedural challenges: None observed.         Reported side-effects: None.        Post-procedural adverse reactions or complications: None reported         Sedation: Please see nurses note for DOS. When no sedatives are used, the analgesic levels obtained are directly associated to the effectiveness of the local anesthetics. However, when sedation is provided, the level of analgesia obtained during the initial 1 hour following the intervention, is believed to be the result of a combination of factors. These factors  may include, but are not limited to: 1. The effectiveness of the local anesthetics used. 2. The effects of the analgesic(s) and/or anxiolytic(s) used. 3. The degree of  discomfort experienced by the patient at the time of the procedure. 4. The patients ability and reliability in recalling and recording the events. 5. The presence and influence of possible secondary gains and/or psychosocial factors. Reported result: Relief experienced during the 1st hour after the procedure: 100% (Ultra-Short Term Relief)            Interpretative annotation: Clinically appropriate result. Analgesia during this period is likely to be Local Anesthetic and/or IV Sedative (Analgesic/Anxiolytic) related.          Effects of local anesthetic: The analgesic effects attained during this period are directly associated to the localized infiltration of local anesthetics and therefore cary significant diagnostic value as to the etiological location, or anatomical origin, of the pain. Expected duration of relief is directly dependent on the pharmacodynamics of the local anesthetic used. Long-acting (4-6 hours) anesthetics used.  Reported result: Relief during the next 4 to 6 hour after the procedure: 100% Short-Term Relief)            Interpretative annotation: Clinically appropriate result. Analgesia during this period is likely to be Local Anesthetic-related.          Long-term benefit: Defined as the period of time past the expected duration of local anesthetics (1 hour for short-acting and 4-6 hours for long-acting). With the possible exception of prolonged sympathetic blockade from the local anesthetics, benefits during this period are typically attributed to, or associated with, other factors such as analgesic sensory neuropraxia, antiinflammatory effects, or beneficial biochemical changes provided by agents other than the local anesthetics.  Reported result: Extended relief following procedure:80% for 3 days with return of  pain thereafter (Long-Term Relief)            Interpretative annotation: Unexpected result. Partial relief.                UDS:  Summary  Date Value Ref Range Status  03/22/2019 Note  Final    Comment:    ==================================================================== Compliance Drug Analysis, Ur ==================================================================== Test                             Result       Flag       Units Drug Present and Declared for Prescription Verification   Alprazolam                     46           EXPECTED   ng/mg creat   Alpha-hydroxyalprazolam        166          EXPECTED   ng/mg creat    Source of alprazolam is a scheduled prescription medication. Alpha-    hydroxyalprazolam is an expected metabolite of alprazolam.   Gabapentin                     PRESENT      EXPECTED   Cyclobenzaprine                PRESENT      EXPECTED   Desmethylcyclobenzaprine       PRESENT      EXPECTED    Desmethylcyclobenzaprine is an expected metabolite of    cyclobenzaprine. Drug Absent but Declared for Prescription Verification   Amphetamine                    Not Detected UNEXPECTED ng/mg creat  Diclofenac                     Not Detected UNEXPECTED    Diclofenac, as indicated in the declared medication list, is not    always detected even when used as directed. ==================================================================== Test                      Result    Flag   Units      Ref Range   Creatinine              226              mg/dL      >=20 ==================================================================== Declared Medications:  The flagging and interpretation on this report are based on the  following declared medications.  Unexpected results may arise from  inaccuracies in the declared medications.  **Note: The testing scope of this panel includes these medications:  Alprazolam (Xanax)  Amphetamine (Adderall)  Cyclobenzaprine (Flexeril)   Gabapentin  **Note: The testing scope of this panel does not include small to  moderate amounts of these reported medications:  Diclofenac (Voltaren)  **Note: The testing scope of this panel does not include the  following reported medications:  Enoxaparin (Lovenox)  Hydrochlorothiazide (Hydrodiuril)  Iron  Lovastatin (Mevacor)  Multivitamin  Pantoprazole (Protonix)  Polyethylene Glycol (MiraLAX)  Potassium (Klor-Con)  Sucralfate (Carafate)  Topical  Vitamin B12  Vitamin D3 ==================================================================== For clinical consultation, please call [phone]. ====================================================================    Laboratory Chemistry Profile (12 mo)  Renal: 07/28/2018: BUN 16 08/11/2018: Creatinine, Ser 0.73  Lab Results  Component Value Date   GFRAA >60 08/11/2018   GFRNONAA >60 08/11/2018   Hepatic: 07/28/2018: Albumin 4.1 Lab Results  Component Value Date   AST 16 07/28/2018   ALT 16 07/28/2018   Other: 07/28/2018: CRP <0.8; Sed Rate 8 Note: Above Lab results reviewed.  Imaging  Last 90 days:  [facility] C-arm 1-60 Min No Report  Result Date: 03/30/2019 Fluoro was used, but no Radiologist interpretation will be provided. Please refer to "NOTES" tab for provider progress note.  Mm 3d Screen Breast Bilateral  Result Date: 03/03/2019 CLINICAL DATA:  Screening. EXAM: DIGITAL SCREENING BILATERAL MAMMOGRAM WITH TOMO AND CAD COMPARISON:  Previous exam(s). ACR Breast Density Category b: There are scattered areas of fibroglandular density. FINDINGS: There are no findings suspicious for malignancy. Images were processed with CAD. IMPRESSION: No mammographic evidence of malignancy. A result letter of this screening mammogram will be mailed directly to the patient. RECOMMENDATION: Screening mammogram in one year. (Code:SM-B-01Y) BI-RADS CATEGORY  1: Negative. Electronically Signed   By: Nolon Nations M.D.   On:  03/03/2019 10:12    Assessment  1. Lumbar facet arthropathy Mackenzie [name] has a history of greater than 3 months of moderate to severe pain which is resulted in functional impairment.  The patient has tried various conservative therapeutic options such as NSAIDs, Tylenol, muscle relaxants, physical therapy which was inadequately effective.  Patient's pain is predominantly axial with physical exam findings suggestive of facet arthropathy.  Lumbar facet medial branch nerve blocks were discussed with the patient.  Risks and benefits were reviewed.  Patient would like to proceed with bilateral L3, L4, L5, S1 medial branch nerve block.  - LUMBAR FACET(MEDIAL BRANCH NERVE BLOCK) MBNB; Future  2. Lumbar spondylosis -Lumbar facet blocks as above - LUMBAR FACET(MEDIAL BRANCH NERVE BLOCK) MBNB; Future  5. History of total right hip replacement -stable  6. Sacroiliac joint pain -consider R-SIJ injection in Past  7. Chronic pain syndrome - START DULoxetine (CYMBALTA) 30 MG capsule; Take 1 capsule (30 mg total) by mouth daily.  Dispense: 30 capsule; Refill: 1 -Continue Gabapentin 300 mg qhs -Continue Diclofenac 75 mg BID  -s/p multiple ESI's which were not effective, do not recommend repeating. -On nightly Xanax to help with sleep (has been on this for many years, avoid opioid therapy, obese)     Plan of Care   Pharmacotherapy (Medications Ordered): Meds ordered this encounter  Medications  . DULoxetine (CYMBALTA) 30 MG capsule    Sig: Take 1 capsule (30 mg total) by mouth daily.    Dispense:  30 capsule    Refill:  1   Orders:  Orders Placed This Encounter  Procedures  . LUMBAR FACET(MEDIAL BRANCH NERVE BLOCK) MBNB    Standing Status:   Future    Standing Expiration Date:   05/27/2019    Scheduling Instructions:     Side: Bilateral     Level: L3-4, L4-5, & L5-S1 Facets ( L3, L4, L5, & S1 Medial Branch Nerves)     Sedation: Patient's choice.     Timeframe: ASAA    Order Specific  Question:   Where will this procedure be performed?    Answer:   [facility]   Follow-up plan:   No follow-ups on file.    Hx of R hip replacement, s/p R L4/5 ESI #1, not effective. Trial of lumbar facet diagnostic blocks, consider SI-J   Recent Visits Date Type Provider Dept  03/30/19 Procedure visit Gillis Santa, MD [facility]  03/22/19 Office Visit Gillis Santa, MD [facility]  Showing recent visits within past 90 days and meeting all other requirements   Today's Visits Date Type Provider Dept  04/26/19 Office Visit Gillis Santa, MD [facility]  Showing today's visits and meeting all other requirements   Future Appointments No visits were found meeting these conditions.  Showing future appointments within next 90 days and meeting all other requirements   I discussed the assessment and treatment plan with the patient. The patient was provided an opportunity to ask questions and all were answered. The patient agreed with the plan and demonstrated an understanding of the instructions.  Patient advised to call back or seek an in-person evaluation if the symptoms or condition worsens.  Total duration of non-face-to-face encounter: 110minutes.  Note by: Gillis Santa, MD Date: 04/26/2019; Time: 9:27 AM  Note: This dictation was prepared with Dragon dictation. Any transcriptional errors that may result from this process are unintentional.  Disclaimer:  * Given the special circumstances of the COVID-19 pandemic, the federal government has announced that the Office for Civil Rights (OCR) will exercise its enforcement discretion and will not impose penalties on physicians using telehealth in the event of noncompliance with regulatory requirements under the [place] and [place] (HIPAA) in connection with the good faith provision of telehealth during the TMLYY-50 national public health emergency. ([place])

## 2019-05-09 ENCOUNTER — Ambulatory Visit
Admission: RE | Admit: 2019-05-09 | Discharge: 2019-05-09 | Disposition: A | Discharge status: Home / Self Care | Payer: BC Managed Care – PPO | Source: Ambulatory Visit | Attending: Student in an Organized Health Care Education/Training Program | Admitting: Student in an Organized Health Care Education/Training Program

## 2019-05-09 ENCOUNTER — Ambulatory Visit (HOSPITAL_BASED_OUTPATIENT_CLINIC_OR_DEPARTMENT_OTHER): Payer: BC Managed Care – PPO | Admitting: Student in an Organized Health Care Education/Training Program

## 2019-05-09 ENCOUNTER — Other Ambulatory Visit: Payer: Self-pay

## 2019-05-09 ENCOUNTER — Encounter: Payer: Self-pay | Admitting: Student in an Organized Health Care Education/Training Program

## 2019-05-09 VITALS — BP 133/62 | HR 58 | Temp 97.8°F | Resp 16 | Ht 67.0 in | Wt 245.0 lb

## 2019-05-09 DIAGNOSIS — M47816 Spondylosis without myelopathy or radiculopathy, lumbar region: Secondary | ICD-10-CM | POA: Diagnosis not present

## 2019-05-09 MED ORDER — ROPIVACAINE HCL 2 MG/ML IJ SOLN
1.0000 mL | Freq: Once | INTRAMUSCULAR | Status: AC
  Administered 2019-05-09: 10:00:00 9 mL via EPIDURAL

## 2019-05-09 MED ORDER — LIDOCAINE HCL 2 % IJ SOLN
20.0000 mL | Freq: Once | INTRAMUSCULAR | Status: AC
  Administered 2019-05-09: 10:00:00 400 mg
  Filled 2019-05-09: qty 20

## 2019-05-09 MED ORDER — DEXAMETHASONE SODIUM PHOSPHATE 10 MG/ML IJ SOLN
10.0000 mg | Freq: Once | INTRAMUSCULAR | Status: AC
  Administered 2019-05-09: 10:00:00 10 mg

## 2019-05-09 MED ORDER — ROPIVACAINE HCL 2 MG/ML IJ SOLN
1.0000 mL | Freq: Once | INTRAMUSCULAR | Status: AC
  Administered 2019-05-09: 10:00:00 9 mL via EPIDURAL
  Filled 2019-05-09: qty 10

## 2019-05-09 MED ORDER — DEXAMETHASONE SODIUM PHOSPHATE 10 MG/ML IJ SOLN
INTRAMUSCULAR | Status: AC
  Filled 2019-05-09: qty 1

## 2019-05-09 MED ORDER — ROPIVACAINE HCL 2 MG/ML IJ SOLN
INTRAMUSCULAR | Status: AC
  Filled 2019-05-09: qty 10

## 2019-05-09 MED ORDER — FENTANYL CITRATE (PF) 100 MCG/2ML IJ SOLN
25.0000 ug | INTRAMUSCULAR | Status: DC | PRN
  Administered 2019-05-09: 10:00:00 100 ug via INTRAVENOUS
  Filled 2019-05-09: qty 2

## 2019-05-09 MED ORDER — DEXAMETHASONE SODIUM PHOSPHATE 10 MG/ML IJ SOLN
10.0000 mg | Freq: Once | INTRAMUSCULAR | Status: AC
  Administered 2019-05-09: 10:00:00 10 mg
  Filled 2019-05-09: qty 1

## 2019-05-09 NOTE — Patient Instructions (Signed)

## 2019-05-09 NOTE — Progress Notes (Signed)
Safety precautions to be maintained throughout the outpatient stay will include: orient to surroundings, keep bed in low position, maintain call bell within reach at all times, provide assstance with transfer out of bed and ambulation.

## 2019-05-09 NOTE — Progress Notes (Signed)
Is patient's Name: Mackenzie [name]  MRN: [id number]  Referring Provider: Idelle Crouch, MD  DOB: [birth year]  PCP: Idelle Crouch, MD  DOS: 05/09/2019  Note by: Gillis Santa, MD  Service setting: Ambulatory outpatient  Specialty: Interventional Pain Management  Patient type: Established  Location: [facility] (AMB) Pain Management Facility  Visit type: Interventional Procedure   Primary Reason for Visit: Interventional Pain Management Treatment. CC: Back Pain (lumbar bilateral )  Procedure:          Anesthesia, Analgesia, Anxiolysis:  Type: Lumbar Facet, Medial Branch Block(s) #1  Primary Purpose: Diagnostic Region: Posterolateral Lumbosacral Spine Level: L3, L4, L5, & S1 Medial Branch Level(s). Injecting these levels blocks the L3-4, L4-5, and L5-S1 lumbar facet joints. Laterality: Bilateral  Type: Moderate (Conscious) Sedation combined with Local Anesthesia Indication(s): Analgesia and Anxiety Route: Intravenous (IV) IV Access: Secured Sedation: Meaningful verbal contact was maintained at all times during the procedure  Local Anesthetic: Lidocaine 1-2%  Position: Prone   Indications: 1. Lumbar facet arthropathy    Pain Score: Pre-procedure: 6 (when walking.  0 at rest)/10 Post-procedure: 0-No pain/10   Pre-op Assessment:  Mackenzie [name] is a 63 y.o. (year old), female patient, seen today for interventional treatment. 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 Mackenzie [name] has a current medication list which includes the following prescription(s): alprazolam, amphetamine-dextroamphetamine, cholecalciferol, cyanocobalamin, cyclobenzaprine, diclofenac,  duloxetine, ferrous sulfate, gabapentin, hydrochlorothiazide, lovastatin, melatonin, airborne, pantoprazole, polyethylene glycol, potassium chloride, sucralfate, alprazolam, clobetasol cream, enoxaparin, and gabapentin, and the following Facility-Administered Medications: fentanyl. Her primarily concern today is the Back Pain (lumbar bilateral )  Initial Vital Signs:  Pulse/HCG Rate: (!) 58ECG Heart Rate: 61 Temp: 97.9 F (36.6 C) Resp: 16 BP: (!) 116/50 SpO2: (!) 9 %  BMI: Estimated body mass index is 38.37 kg/m as calculated from the following:   Height as of this encounter: 5\' 7"  (1.702 m).   Weight as of this encounter: 245 lb (111.1 kg).  Risk Assessment: Allergies: Reviewed. She has No Known Allergies.  Allergy Precautions: None required Coagulopathies: Reviewed. None identified.  Blood-thinner therapy: None at this time Active Infection(s): Reviewed. None identified. Mackenzie [name] is afebrile  Site Confirmation: Mackenzie [name] was asked to confirm the procedure and laterality before marking the site Procedure checklist: Completed Consent: Before the procedure and under the influence of no sedative(s), amnesic(s), or anxiolytics, the patient was informed of the treatment options, risks and possible complications. To fulfill our ethical and legal obligations, as recommended by the American Medical Association's Code of Ethics, I have informed the patient of my clinical impression; the nature and purpose of the treatment or procedure; the risks, benefits, and possible complications of the intervention; the alternatives, including doing nothing; the risk(s) and benefit(s) of the alternative treatment(s) or procedure(s); and the risk(s) and benefit(s) of doing nothing. The patient was provided information about the general risks and possible complications associated with the procedure. These may include, but are not limited to: failure to achieve desired goals, infection, bleeding, organ or nerve  damage, allergic reactions, paralysis, and death. In addition, the patient was informed of those risks and complications associated to Spine-related procedures, such as failure to decrease pain; infection (i.e.: Meningitis, epidural or intraspinal abscess); bleeding (i.e.: epidural hematoma,  subarachnoid hemorrhage, or any other type of intraspinal or peri-dural bleeding); organ or nerve damage (i.e.: Any type of peripheral nerve, nerve root, or spinal cord injury) with subsequent damage to sensory, motor, and/or autonomic systems, resulting in permanent pain, numbness, and/or weakness of one or several areas of the body; allergic reactions; (i.e.: anaphylactic reaction); and/or death. Furthermore, the patient was informed of those risks and complications associated with the medications. These include, but are not limited to: allergic reactions (i.e.: anaphylactic or anaphylactoid reaction(s)); adrenal axis suppression; blood sugar elevation that in diabetics may result in ketoacidosis or comma; water retention that in patients with history of congestive heart failure may result in shortness of breath, pulmonary edema, and decompensation with resultant heart failure; weight gain; swelling or edema; medication-induced neural toxicity; particulate matter embolism and blood vessel occlusion with resultant organ, and/or nervous system infarction; and/or aseptic necrosis of one or more joints. Finally, the patient was informed that Medicine is not an exact science; therefore, there is also the possibility of unforeseen or unpredictable risks and/or possible complications that may result in a catastrophic outcome. The patient indicated having understood very clearly. We have given the patient no guarantees and we have made no promises. Enough time was given to the patient to ask questions, all of which were answered to the patient's satisfaction. Mackenzie [name] has indicated that she wanted to continue with the  procedure. Attestation: I, the ordering provider, attest that I have discussed with the patient the benefits, risks, side-effects, alternatives, likelihood of achieving goals, and potential problems during recovery for the procedure that I have provided informed consent. Date   Time: 05/09/2019  8:22 AM  Pre-Procedure Preparation:  Monitoring: As per clinic protocol. Respiration, ETCO2, SpO2, BP, heart rate and rhythm monitor placed and checked for adequate function Safety Precautions: Patient was assessed for positional comfort and pressure points before starting the procedure. Time-out: I initiated and conducted the "Time-out" before starting the procedure, as per protocol. The patient was asked to participate by confirming the accuracy of the "Time Out" information. Verification of the correct person, site, and procedure were performed and confirmed by me, the nursing staff, and the patient. "Time-out" conducted as per Joint Commission's Universal Protocol (UP.01.01.01). Time: 0925  Description of Procedure:          Laterality: Bilateral. The procedure was performed in identical fashion on both sides. Levels:  L3, L4, L5, & S1 Medial Branch Level(s) Area Prepped: Posterior Lumbosacral Region Prepping solution: DuraPrep (Iodine Povacrylex [0.7% available iodine] and Isopropyl Alcohol, 74% w/w) Safety Precautions: Aspiration looking for blood return was conducted prior to all injections. At no point did we inject any substances, as a needle was being advanced. Before injecting, the patient was told to immediately notify me if she was experiencing any new onset of "ringing in the ears, or metallic taste in the mouth". No attempts were made at seeking any paresthesias. Safe injection practices and needle disposal techniques used. Medications properly checked for expiration dates. SDV (single dose vial) medications used. After the completion of the procedure, all disposable equipment used was discarded  in the proper designated medical waste containers. Local Anesthesia: Protocol guidelines were followed. The patient was positioned over the fluoroscopy table. The area was prepped in the usual manner. The time-out was completed. The target area was identified using fluoroscopy. A 12-in long, straight, sterile hemostat was used with fluoroscopic guidance to locate the targets for each level blocked. Once located, the skin was marked with an  approved surgical skin marker. Once all sites were marked, the skin (epidermis, dermis, and hypodermis), as well as deeper tissues (fat, connective tissue and muscle) were infiltrated with a small amount of a short-acting local anesthetic, loaded on a 10cc syringe with a 25G, 1.5-in  Needle. An appropriate amount of time was allowed for local anesthetics to take effect before proceeding to the next step. Local Anesthetic: Lidocaine 2.0% The unused portion of the local anesthetic was discarded in the proper designated containers. Technical explanation of process:   L3 Medial Branch Nerve Block (MBB): The target area for the L3 medial branch is at the junction of the postero-lateral aspect of the superior articular process and the superior, posterior, and medial edge of the transverse process of L4. Under fluoroscopic guidance, a Quincke needle was inserted until contact was made with os over the superior postero-lateral aspect of the pedicular shadow (target area). After negative aspiration for blood, 1 mL of the nerve block solution was injected without difficulty or complication. The needle was removed intact. L4 Medial Branch Nerve Block (MBB): The target area for the L4 medial branch is at the junction of the postero-lateral aspect of the superior articular process and the superior, posterior, and medial edge of the transverse process of L5. Under fluoroscopic guidance, a Quincke needle was inserted until contact was made with os over the superior postero-lateral aspect  of the pedicular shadow (target area). After negative aspiration for blood, 19mL of the nerve block solution was injected without difficulty or complication. The needle was removed intact. L5 Medial Branch Nerve Block (MBB): The target area for the L5 medial branch is at the junction of the postero-lateral aspect of the superior articular process and the superior, posterior, and medial edge of the sacral ala. Under fluoroscopic guidance, a Quincke needle was inserted until contact was made with os over the superior postero-lateral aspect of the pedicular shadow (target area). After negative aspiration for blood, 83mL of the nerve block solution was injected without difficulty or complication. The needle was removed intact. S1 Medial Branch Nerve Block (MBB): The target area for the S1 medial branch is at the posterior and inferior 6 o'clock position of the L5-S1 facet joint. Under fluoroscopic guidance, the Quincke needle inserted for the L5 MBB was redirected until contact was made with os over the inferior and postero aspect of the sacrum, at the 6 o' clock position under the L5-S1 facet joint (Target area). After negative aspiration for blood, 61mL of the nerve block solution was injected without difficulty or complication. The needle was removed intact.  Nerve block solution: 10 cc solution made of 8 cc of 0.2% ropivacaine, 2 cc of Decadron 10 mg/cc.  1-1.5 cc injected at each level above bilaterally.  The unused portion of the solution was discarded in the proper designated containers. Procedural Needles: 22-gauge, 5-inch, Quincke needles used for all levels.  Once the entire procedure was completed, the treated area was cleaned, making sure to leave some of the prepping solution back to take advantage of its long term bactericidal properties.   Illustration of the posterior view of the lumbar spine and the posterior neural structures. Laminae of L2 through S1 are labeled. DPRL5, dorsal primary ramus of  L5; DPRS1, dorsal primary ramus of S1; DPR3, dorsal primary ramus of L3; FJ, facet (zygapophyseal) joint L3-L4; I, inferior articular process of L4; LB1, lateral branch of dorsal primary ramus of L1; IAB, inferior articular branches from L3 medial branch (supplies L4-L5 facet joint);  IBP, intermediate branch plexus; MB3, medial branch of dorsal primary ramus of L3; NR3, third lumbar nerve root; S, superior articular process of L5; SAB, superior articular branches from L4 (supplies L4-5 facet joint also); TP3, transverse process of L3.  Vitals:   05/09/19 0951 05/09/19 0958 05/09/19 1008 05/09/19 1018  BP: (!) 148/95 126/74 121/79 133/62  Pulse:      Resp: 10 16 16 16   Temp:   97.8 F (36.6 C)   TempSrc:      SpO2: 98% 94% 99% 99%  Weight:      Height:         Start Time: 0925 hrs. End Time: 0946 hrs.  Imaging Guidance (Spinal):          Type of Imaging Technique: Fluoroscopy Guidance (Spinal) Indication(s): Assistance in needle guidance and placement for procedures requiring needle placement in or near specific anatomical locations not easily accessible without such assistance. Exposure Time: Please see nurses notes. Contrast: None used. Fluoroscopic Guidance: I was personally present during the use of fluoroscopy. "Tunnel Vision Technique" used to obtain the best possible view of the target area. Parallax error corrected before commencing the procedure. "Direction-depth-direction" technique used to introduce the needle under continuous pulsed fluoroscopy. Once target was reached, antero-posterior, oblique, and lateral fluoroscopic projection used confirm needle placement in all planes. Images permanently stored in EMR. Interpretation: No contrast injected. I personally interpreted the imaging intraoperatively. Adequate needle placement confirmed in multiple planes. Permanent images saved into the patient's record.  Antibiotic Prophylaxis:   Anti-infectives (From admission, onward)    None     Indication(s): None identified  Post-operative Assessment:  Post-procedure Vital Signs:  Pulse/HCG Rate: (!) 58(!) 55 Temp: 97.8 F (36.6 C) Resp: 16 BP: 133/62 SpO2: 99 %  EBL: None  Complications: No immediate post-treatment complications observed by team, or reported by patient.  Note: The patient tolerated the entire procedure well. A repeat set of vitals were taken after the procedure and the patient was kept under observation following institutional policy, for this type of procedure. Post-procedural neurological assessment was performed, showing return to baseline, prior to discharge. The patient was provided with post-procedure discharge instructions, including a section on how to identify potential problems. Should any problems arise concerning this procedure, the patient was given instructions to immediately contact us, at any time, without hesitation. In any case, we plan to contact the patient by telephone for a follow-up status report regarding this interventional procedure.  Comments:  No additional relevant information.  Plan of Care  Orders:  Orders Placed This Encounter  Procedures   DG PAIN CLINIC C-ARM 1-60 MIN NO REPORT    Intraoperative interpretation by procedural physician at [place].    Standing Status:   Standing    Number of Occurrences:   1    Order Specific Question:   Reason for exam:    Answer:   Assistance in needle guidance and placement for procedures requiring needle placement in or near specific anatomical locations not easily accessible without such assistance.  Medications ordered for procedure: Meds ordered this encounter  Medications   lidocaine (XYLOCAINE) 2 % (with pres) injection 400 mg   fentaNYL (SUBLIMAZE) injection 25-50 mcg    Make sure Narcan is available in the pyxis when using this medication. In the event of respiratory depression (RR< 8/min): Titrate NARCAN (naloxone) in increments of 0.1 to 0.2 mg IV  at 2-3 minute intervals, until desired degree of reversal.   ropivacaine (PF) 2  mg/mL (0.2%) (NAROPIN) injection 1 mL   dexamethasone (DECADRON) injection 10 mg   ropivacaine (PF) 2 mg/mL (0.2%) (NAROPIN) injection 1 mL   dexamethasone (DECADRON) injection 10 mg   Medications administered: We administered lidocaine, fentaNYL, ropivacaine (PF) 2 mg/mL (0.2%), dexamethasone, ropivacaine (PF) 2 mg/mL (0.2%), and dexamethasone.  See the medical record for exact dosing, route, and time of administration.  Follow-up plan:   Return in about 4 weeks (around 06/06/2019) for Post Procedure Evaluation, virtual.      Hx of R hip replacement, s/p R L4/5 ESI #1, not effective. Trial of lumbar facet diagnostic blocks at L3,4,5 S1 b/l on 05/09/2019, consider SI-J    Recent Visits Date Type Provider Dept  04/26/19 Office Visit Gillis Santa, MD [facility]  03/30/19 Procedure visit Gillis Santa, MD [facility]  03/22/19 Office Visit Gillis Santa, MD [facility]  Showing recent visits within past 90 days and meeting all other requirements   Today's Visits Date Type Provider Dept  05/09/19 Procedure visit Gillis Santa, MD [facility]  Showing today's visits and meeting all other requirements   Future Appointments Date Type Provider Dept  06/06/19 Appointment Gillis Santa, MD [facility]  Showing future appointments within next 90 days and meeting all other requirements   Disposition: Discharge home  Discharge Date & Time: 05/09/2019; 1018 hrs.   Primary Care Physician: Idelle Crouch, MD Location: [facility] Note by: Gillis Santa, MD Date: 05/09/2019; Time: 10:53 AM  Disclaimer:  Medicine is not an exact science. The only guarantee in medicine is that nothing is guaranteed. It is important to note that the decision to proceed with this intervention was based on the information collected from the patient. The  Data and conclusions were drawn from the patient's questionnaire, the interview, and the physical examination. Because the information was provided in large part by the patient, it cannot be guaranteed that it has not been purposely or unconsciously manipulated. Every effort has been made to obtain as much relevant data as possible for this evaluation. It is important to note that the conclusions that lead to this procedure are derived in large part from the available data. Always take into account that the treatment will also be dependent on availability of resources and existing treatment guidelines, considered by other Pain Management Practitioners as being common knowledge and practice, at the time of the intervention. For Medico-Legal purposes, it is also important to point out that variation in procedural techniques and pharmacological choices are the acceptable norm. The indications, contraindications, technique, and results of the above procedure should only be interpreted and judged by a Board-Certified Interventional Pain Specialist with extensive familiarity and expertise in the same exact procedure and technique.

## 2019-05-10 ENCOUNTER — Telehealth: Payer: Self-pay

## 2019-05-10 DIAGNOSIS — A6000 Herpesviral infection of urogenital system, unspecified: Secondary | ICD-10-CM

## 2019-05-10 HISTORY — DX: Herpesviral infection of urogenital system, unspecified: A60.00

## 2019-05-10 NOTE — Telephone Encounter (Signed)
Post procedure phone call.  Complained of headache which does not improve when lying down.  Denies light sensitivity.  States it is a normal headache, but a little worse.  Dr Holley Raring notified.  Informed patient to call us for any further questions or concerns.

## 2019-05-18 ENCOUNTER — Other Ambulatory Visit: Payer: Self-pay | Admitting: Student in an Organized Health Care Education/Training Program

## 2019-05-18 DIAGNOSIS — G894 Chronic pain syndrome: Secondary | ICD-10-CM

## 2019-05-31 ENCOUNTER — Other Ambulatory Visit: Payer: Self-pay

## 2019-05-31 ENCOUNTER — Encounter: Payer: Self-pay | Admitting: Obstetrics and Gynecology

## 2019-05-31 ENCOUNTER — Ambulatory Visit (INDEPENDENT_AMBULATORY_CARE_PROVIDER_SITE_OTHER): Payer: BC Managed Care – PPO | Admitting: Obstetrics and Gynecology

## 2019-05-31 VITALS — BP 140/80 | Ht 68.0 in | Wt 254.0 lb

## 2019-05-31 DIAGNOSIS — N898 Other specified noninflammatory disorders of vagina: Secondary | ICD-10-CM

## 2019-05-31 DIAGNOSIS — L900 Lichen sclerosus et atrophicus: Secondary | ICD-10-CM | POA: Diagnosis not present

## 2019-05-31 LAB — POCT WET PREP WITH KOH
Clue Cells Wet Prep HPF POC: NEGATIVE
KOH Prep POC: NEGATIVE
Trichomonas, UA: NEGATIVE
Yeast Wet Prep HPF POC: NEGATIVE

## 2019-05-31 MED ORDER — CLOBETASOL PROPIONATE 0.05 % EX OINT: TOPICAL_OINTMENT | CUTANEOUS | 1 refills | Status: DC

## 2019-05-31 NOTE — Progress Notes (Signed)
Idelle Crouch, MD   Chief Complaint  Patient presents with  . Vaginal Rash    there is itchiness and fishy odor x 3 weeks  . Vaginal Bleeding    a few weeks had some spotting that worried pt, one time thing    HPI:      Mackenzie [name] is a 63 y.o. [id number] who LMP was No LMP recorded. Patient is postmenopausal., presents today for vaginal irritation/itch that started a few wks ago. Felt like blisters/painful areas, sx improving now. Noticed slight blood in underwear where lesions are. Treating with hydrocortisone crm and clobetasol crm. Also has had increased d/c with odor.  Pt is not sex active. No prior abx use. No flu-like sx before sx. Pt has hx of oral HSV. Took 8 valtrex tabs (unsure of dose) for cold sore sx last wk.   Hx of LS, treated with clobetasol. Affected area can be perineal/perianal area, as well as clitoral area/labia minora and majora. Uses clobetasol crm 2-3 times wkly for sx control.   Past Medical History:  Diagnosis Date  . ADD (attention deficit disorder)   . Anemia    iron deficiency, b12  . Anxiety   . Barrett's esophagus 2019  . Cold sore   . Colon polyps   . COPD (chronic obstructive pulmonary disease) ([place])   . Essential hypertension   . GERD (gastroesophageal reflux disease)   . History of hiatal hernia   . Hyperlipidemia   . Lichen sclerosus   . Osteoarthritis   . Other abnormal auditory perceptions, bilateral 2019   hip and back issues  . RLS (restless legs syndrome)   . Shingles 09/2018  . Vitamin B12 deficiency     Past Surgical History:  Procedure Laterality Date  . BREAST CYST ASPIRATION Right 1998   neg  . broken leg repair Right   . BUNIONECTOMY Right 2010   pins in toes of right foot  . COLONOSCOPY  2014  . COLONOSCOPY WITH PROPOFOL N/A 05/24/2018   Procedure: COLONOSCOPY WITH PROPOFOL;  Surgeon: Lollie Sails, MD;  Location: [facility] ENDOSCOPY;  Service: Endoscopy;  Laterality: N/A;  . ESOPHAGOGASTRODUODENOSCOPY (EGD)  WITH PROPOFOL N/A 05/24/2018   Procedure: ESOPHAGOGASTRODUODENOSCOPY (EGD) WITH PROPOFOL;  Surgeon: Lollie Sails, MD;  Location: [facility] ENDOSCOPY;  Service: Endoscopy;  Laterality: N/A;  . eye plugs Bilateral    plugs placed for dry eyes  . INDUCED ABORTION    . PLANTAR FASCIA SURGERY Left 2005  . TOTAL HIP ARTHROPLASTY Right 08/11/2018   Procedure: TOTAL HIP ARTHROPLASTY;  Surgeon: Dereck Leep, MD;  Location: [facility] ORS;  Service: Orthopedics;  Laterality: Right;    Family History  Problem Relation Age of Onset  . Breast cancer Cousin 11       mat second cousin  . Liver cancer Maternal Aunt   . Alcoholism Mother   . Hypertension Mother   . Diabetes Mother        type 1  . Congestive Heart Failure Mother   . Diabetes Maternal Grandmother        type 2  . Breast cancer Paternal Grandmother 56  . Arthritis Sister   . Diabetes Sister   . Stroke Brother 64    Social History   Socioeconomic History  . Marital status: Single    Spouse name: s.o. michael  . Number of children: 2  . Years of education: 53  . Highest education level: Not on file  Occupational History  .  Occupation: Surveyor, minerals    Comment: school Network engineer  Social Needs  . Financial resource strain: Not on file  . Food insecurity    Worry: Not on file    Inability: Not on file  . Transportation needs    Medical: Not on file    Non-medical: Not on file  Tobacco Use  . Smoking status: Former Smoker    Quit date: 2017    Years since quitting: 3.7  . Smokeless tobacco: Never Used  Substance and Sexual Activity  . Alcohol use: Yes    Alcohol/week: 0.0 - 2.0 standard drinks  . Drug use: No  . Sexual activity: Not Currently    Partners: Male    Birth control/protection: Post-menopausal  Lifestyle  . Physical activity    Days per week: Not on file    Minutes per session: Not on file  . Stress: Not on file  Relationships  . Social Herbalist on phone: Not on file    Gets together:  Not on file    Attends religious service: Not on file    Active member of club or organization: Not on file    Attends meetings of clubs or organizations: Not on file    Relationship status: Not on file  . Intimate partner violence    Fear of current or ex partner: Not on file    Emotionally abused: Not on file    Physically abused: Not on file    Forced sexual activity: Not on file  Other Topics Concern  . Not on file  Social History Narrative  . Not on file    Outpatient Medications Prior to Visit  Medication Sig Dispense Refill  . ALPRAZolam (XANAX) 1 MG tablet TAKE 1 TABLET BY MOUTH NIGHTLY AS NEEDED FOR SLEEP    . cholecalciferol (VITAMIN D3) 25 MCG (1000 UT) tablet Take 1,000 Units by mouth every 3 (three) days.    . cyanocobalamin (,VITAMIN B-12,) 1000 MCG/ML injection Inject into the muscle.    . cyclobenzaprine (FLEXERIL) 10 MG tablet Take 10 mg by mouth daily as needed for muscle spasms.     . diclofenac (VOLTAREN) 75 MG EC tablet Take 1 tablet (75 mg total) by mouth 2 (two) times daily after a meal. 60 tablet 2  . DULoxetine (CYMBALTA) 30 MG capsule Take 1 capsule (30 mg total) by mouth daily. 30 capsule 1  . ferrous sulfate 325 (65 FE) MG tablet Take 325 mg by mouth 2 (two) times daily with a meal.   3  . gabapentin (NEURONTIN) 300 MG capsule TAKE 1 CAPSULE BY MOUTH THREE TIMES A DAY    . hydrochlorothiazide (HYDRODIURIL) 25 MG tablet Take 25 mg by mouth daily.   11  . lovastatin (MEVACOR) 40 MG tablet Take 40 mg by mouth at bedtime.   3  . Melatonin 3 MG CAPS Take by mouth at bedtime.    . pantoprazole (PROTONIX) 40 MG tablet Take 40 mg by mouth daily.   3  . polyethylene glycol (MIRALAX / GLYCOLAX) packet Take 17 g by mouth at bedtime.    . sucralfate (CARAFATE) 1 g tablet Take 1 g by mouth 2 (two) times daily.     . clobetasol cream (TEMOVATE) [id number] % Apply 1 application topically 2 (two) times daily. For 2 weeks then decrease to once daily at hs 30 g 1  . ALPRAZolam  (XANAX) 0.5 MG tablet Take 0.5 mg by mouth at bedtime.   5  .  amphetamine-dextroamphetamine (ADDERALL) 30 MG tablet Take 30 mg by mouth 2 (two) times daily.     Marland Kitchen enoxaparin (LOVENOX) 40 MG/0.4ML injection Inject 0.4 mLs (40 mg total) into the skin daily for 14 days. 14 Syringe 0  . gabapentin (NEURONTIN) 100 MG capsule 100 mg qAM, 100 qPM (Patient not taking: Reported on 05/09/2019) 60 capsule 5  . gabapentin (NEURONTIN) 300 MG capsule Take 1 capsule (300 mg total) by mouth at bedtime. 90 capsule 2  . Multiple Vitamins-Minerals (A[place]NE) CHEW Chew 1 tablet by mouth daily.    . potassium chloride (KLOR-CON) 20 MEQ packet Take 20 mEq by mouth 2 (two) times daily.     No facility-administered medications prior to visit.       ROS:  Review of Systems  Constitutional: Negative for fatigue, fever and unexpected weight change.  Respiratory: Negative for cough, shortness of breath and wheezing.   Cardiovascular: Negative for chest pain, palpitations and leg swelling.  Gastrointestinal: Negative for blood in stool, constipation, diarrhea, nausea and vomiting.  Endocrine: Negative for cold intolerance, heat intolerance and polyuria.  Genitourinary: Positive for genital sores and vaginal discharge. Negative for dyspareunia, dysuria, flank pain, frequency, hematuria, menstrual problem, pelvic pain, urgency, vaginal bleeding and vaginal pain.  Musculoskeletal: Negative for back pain, joint swelling and myalgias.  Skin: Negative for rash.  Neurological: Negative for dizziness, syncope, light-headedness, numbness and headaches.  Hematological: Negative for adenopathy.  Psychiatric/Behavioral: Negative for agitation, confusion, sleep disturbance and suicidal ideas. The patient is not nervous/anxious.    BREAST: No symptoms   OBJECTIVE:   Vitals:  BP 140/80   Ht 5\' 8"  (1.727 m)   Wt 254 lb (115.2 kg)   BMI 38.62 kg/m   Physical Exam Vitals signs reviewed.  Constitutional:      Appearance:  She is well-developed.  Neck:     Musculoskeletal: Normal range of motion.  Pulmonary:     Effort: Pulmonary effort is normal.  Genitourinary:    General: Normal vulva.     Pubic Area: No rash.      Labia:        Right: Lesion present. No rash or tenderness.        Left: Lesion present. No rash or tenderness.      Vagina: Normal. No vaginal discharge, erythema or tenderness.     Cervix: Normal.     Uterus: Normal. Not enlarged and not tender.      Adnexa: Right adnexa normal and left adnexa normal.       Right: No mass or tenderness.         Left: No mass or tenderness.         Comments: MULT RESOLVING ULCERATIVE LESIONS AND FISSURES, NEAR CLITORIS, LT LABIA MINORA AND LT PERIANAL AREA Musculoskeletal: Normal range of motion.  Skin:    General: Skin is warm and dry.  Neurological:     General: No focal deficit present.     Mental Status: She is alert and oriented to person, place, and time.  Psychiatric:        Mood and Affect: Mood normal.        Behavior: Behavior normal.        Thought Content: Thought content normal.        Judgment: Judgment normal.     Results: Results for orders placed or performed in visit on 05/31/19 (from the past 24 hour(s))  POCT Wet Prep with KOH     Status: Normal   Collection Time:  05/31/19 11:32 AM  Result Value Ref Range   Trichomonas, UA Negative    Clue Cells Wet Prep HPF POC neg    Epithelial Wet Prep HPF POC     Yeast Wet Prep HPF POC neg    Bacteria Wet Prep HPF POC     RBC Wet Prep HPF POC     WBC Wet Prep HPF POC     KOH Prep POC Negative Negative     Assessment/Plan: Vaginal lesion - Plan: Other/Misc lab test; Question HSV vs worsening LS. Check culture. Will call with results. If neg, will check HSV 2 IgG and re-eval lesions. If persist, will increase LS tx. If resovled, most likely HSV outbreak. Warm compresses (can't sit in tub due to hip tx)/vaseline to lesions to protect from painful urination.   Vaginal discharge -  Plan: POCT Wet Prep with KOH; neg wet prep. F/u prn.   Lichen sclerosus et atrophicus - Plan: clobetasol ointment (TEMOVATE) 0.05 %; Rx RF clobetasol oint.    Meds ordered this encounter  Medications  . clobetasol ointment (TEMOVATE) 0.05 %    Sig: Apply to affected 2-3 times weekly    Dispense:  45 g    Refill:  1    Order Specific Question:   Supervising Provider    Answer:   Gae Dry [id number]      Return if symptoms worsen or fail to improve.  Belem Hintze B. Lizbett Garciagarcia, PA-C 05/31/2019 11:35 AM

## 2019-05-31 NOTE — Patient Instructions (Signed)
I value your feedback and entrusting us with your care. If you get a  patient survey, I would appreciate you taking the time to let us know about your experience today. Thank you! 

## 2019-06-01 ENCOUNTER — Encounter: Payer: Self-pay | Admitting: Student in an Organized Health Care Education/Training Program

## 2019-06-06 ENCOUNTER — Ambulatory Visit
Payer: BC Managed Care – PPO | Attending: Student in an Organized Health Care Education/Training Program | Admitting: Student in an Organized Health Care Education/Training Program

## 2019-06-06 ENCOUNTER — Other Ambulatory Visit: Payer: BC Managed Care – PPO

## 2019-06-06 ENCOUNTER — Encounter: Payer: Self-pay | Admitting: Student in an Organized Health Care Education/Training Program

## 2019-06-06 ENCOUNTER — Telehealth: Payer: Self-pay | Admitting: Obstetrics and Gynecology

## 2019-06-06 ENCOUNTER — Other Ambulatory Visit: Payer: Self-pay

## 2019-06-06 DIAGNOSIS — M47816 Spondylosis without myelopathy or radiculopathy, lumbar region: Secondary | ICD-10-CM

## 2019-06-06 DIAGNOSIS — N898 Other specified noninflammatory disorders of vagina: Secondary | ICD-10-CM

## 2019-06-06 NOTE — Telephone Encounter (Signed)
Pt aware of neg one Swab HSV culture for vaginal lesions. Pt had had sx for a couple wks before coming to office and had taken about 8 valtrex. Hx of oral HSV. Will check HSV 2 IgG. If neg, sx were most likely HSV 1. Also with hx of LS, so need to confirm resolution of lesions if HSV. Sx still improving per pt report.

## 2019-06-06 NOTE — Progress Notes (Signed)
Pain Management Virtual Encounter Note - Virtual Visit via [place] (real-time audio visits between healthcare provider and patient).   Patient's Phone No. & Preferred Pharmacy:  [phone] (home); [phone] (mobile); (Preferred) [phone] [email or web address]  [id number] [address], [place] [address] Phone: [phone] Fax: [phone]    Pre-screening note:  Our staff contacted Mackenzie [name] and offered her an "in person", "face-to-face" appointment versus a telephone encounter. She indicated preferring the telephone encounter, at this time.   Reason for Virtual Visit: COVID-19*  Social distancing based on CDC and AMA recommendations.   I contacted ELANTRA BIANCONI on 06/06/2019 via video conference.      I clearly identified myself as Gillis Santa, MD. I verified that I was speaking with the correct person using two identifiers (Name: Mackenzie [name], and date of birth: 11-Jun-1956).  Advanced Informed Consent I sought verbal advanced consent from Mackenzie [name] for virtual visit interactions. I informed Mackenzie [name] of possible security and privacy concerns, risks, and limitations associated with providing "not-in-person" medical evaluation and management services. I also informed Mackenzie [name] of the availability of "in-person" appointments. Finally, I informed her that there would be a charge for the virtual visit and that she could be  personally, fully or partially, financially responsible for it. Mackenzie [name] expressed understanding and agreed to proceed.   Historic Elements   Mackenzie [name] is a 63 y.o. year old, female patient evaluated today after her last encounter by our practice on 05/18/2019. Mackenzie [name]  has a past medical history of ADD (attention deficit disorder), Anemia, Anxiety, Barrett's esophagus (2019), Cold sore, Colon polyps, COPD (chronic obstructive pulmonary disease) ([place]), Essential hypertension, GERD  (gastroesophageal reflux disease), History of hiatal hernia, Hyperlipidemia, Lichen sclerosus, Osteoarthritis, Other abnormal auditory perceptions, bilateral (2019), RLS (restless legs syndrome), Shingles (09/2018), and Vitamin B12 deficiency. She also  has a past surgical history that includes Bunionectomy (Right, 2010); Colonoscopy (2014); broken leg repair (Right); Colonoscopy with propofol (N/A, 05/24/2018); Esophagogastroduodenoscopy (egd) with propofol (N/A, 05/24/2018); Induced abortion; Plantar fascia surgery (Left, 2005); Breast cyst aspiration (Right, 1998); eye plugs (Bilateral); and Total hip arthroplasty (Right, 08/11/2018). Mackenzie [name] has a current medication list which includes the following prescription(s): alprazolam, cholecalciferol, clobetasol ointment, cyanocobalamin, cyclobenzaprine, diclofenac, duloxetine, ferrous sulfate, gabapentin, lovastatin, melatonin, pantoprazole, polyethylene glycol, sucralfate, and hydrochlorothiazide. She  reports that she quit smoking about 3 years ago. She has never used smokeless tobacco. She reports current alcohol use. She reports that she does not use drugs. Mackenzie [name] has No Known Allergies.   HPI  Today, she is being contacted for a post-procedure assessment.  Evaluation of last interventional procedure  05/18/2019 Procedure:   Type: Lumbar Facet, Medial Branch Block(s) #1  Primary Purpose: Diagnostic Region: Posterolateral Lumbosacral Spine Level: L3, L4, L5, & S1 Medial Branch Level(s). Injecting these levels blocks the L3-4, L4-5, and L5-S1 lumbar facet joints. Laterality: Bilateral  Pre-procedure pain score:  6/10 Post-procedure pain score: 0/10         Influential Factors: Intra-procedural challenges: None observed.         Reported side-effects: None.        Post-procedural adverse reactions or complications: None reported         Sedation: Please see nurses note for DOS. When no sedatives are used, the analgesic levels obtained are directly  associated to the effectiveness of the local anesthetics. However, when sedation is provided, the level of analgesia  obtained during the initial 1 hour following the intervention, is believed to be the result of a combination of factors. These factors may include, but are not limited to: 1. The effectiveness of the local anesthetics used. 2. The effects of the analgesic(s) and/or anxiolytic(s) used. 3. The degree of discomfort experienced by the patient at the time of the procedure. 4. The patients ability and reliability in recalling and recording the events. 5. The presence and influence of possible secondary gains and/or psychosocial factors. Reported result: Relief experienced during the 1st hour after the procedure: 100%   (Ultra-Short Term Relief)            Interpretative annotation: Clinically appropriate result. Analgesia during this period is likely to be Local Anesthetic and/or IV Sedative (Analgesic/Anxiolytic) related.          Effects of local anesthetic: The analgesic effects attained during this period are directly associated to the localized infiltration of local anesthetics and therefore cary significant diagnostic value as to the etiological location, or anatomical origin, of the pain. Expected duration of relief is directly dependent on the pharmacodynamics of the local anesthetic used. Long-acting (4-6 hours) anesthetics used.  Reported result: Relief during the next 4 to 6 hour after the procedure: 100%   (Short-Term Relief)            Interpretative annotation: Clinically appropriate result. Analgesia during this period is likely to be Local Anesthetic-related.          Long-term benefit: Defined as the period of time past the expected duration of local anesthetics (1 hour for short-acting and 4-6 hours for long-acting). With the possible exception of prolonged sympathetic blockade from the local anesthetics, benefits during this period are typically attributed to, or associated  with, other factors such as analgesic sensory neuropraxia, antiinflammatory effects, or beneficial biochemical changes provided by agents other than the local anesthetics.  Reported result: Extended relief following procedure: 60% for first 5-7 days with gradual return of pain thereafter   (Long-Term Relief)            Interpretative annotation: Clinically appropriate result. Good relief. No permanent benefit expected. Inflammation plays a part in the etiology to the pain.          UDS:  Summary  Date Value Ref Range Status  03/22/2019 Note  Final    Comment:    ==================================================================== Compliance Drug Analysis, Ur ==================================================================== Test                             Result       Flag       Units Drug Present and Declared for Prescription Verification   Alprazolam                     46           EXPECTED   ng/mg creat   Alpha-hydroxyalprazolam        166          EXPECTED   ng/mg creat    Source of alprazolam is a scheduled prescription medication. Alpha-    hydroxyalprazolam is an expected metabolite of alprazolam.   Gabapentin                     PRESENT      EXPECTED   Cyclobenzaprine                PRESENT  EXPECTED   Desmethylcyclobenzaprine       PRESENT      EXPECTED    Desmethylcyclobenzaprine is an expected metabolite of    cyclobenzaprine. Drug Absent but Declared for Prescription Verification   Amphetamine                    Not Detected UNEXPECTED ng/mg creat   Diclofenac                     Not Detected UNEXPECTED    Diclofenac, as indicated in the declared medication list, is not    always detected even when used as directed. ==================================================================== Test                      Result    Flag   Units      Ref Range   Creatinine              226              mg/dL       >=20 ==================================================================== Declared Medications:  The flagging and interpretation on this report are based on the  following declared medications.  Unexpected results may arise from  inaccuracies in the declared medications.  **Note: The testing scope of this panel includes these medications:  Alprazolam (Xanax)  Amphetamine (Adderall)  Cyclobenzaprine (Flexeril)  Gabapentin  **Note: The testing scope of this panel does not include small to  moderate amounts of these reported medications:  Diclofenac (Voltaren)  **Note: The testing scope of this panel does not include the  following reported medications:  Enoxaparin (Lovenox)  Hydrochlorothiazide (Hydrodiuril)  Iron  Lovastatin (Mevacor)  Multivitamin  Pantoprazole (Protonix)  Polyethylene Glycol (MiraLAX)  Potassium (Klor-Con)  Sucralfate (Carafate)  Topical  Vitamin B12  Vitamin D3 ==================================================================== For clinical consultation, please call [phone]. ====================================================================    Laboratory Chemistry Profile (12 mo)  Renal: 07/28/2018: BUN 16 08/11/2018: Creatinine, Ser 0.73  Lab Results  Component Value Date   GFRAA >60 08/11/2018   GFRNONAA >60 08/11/2018   Hepatic: 07/28/2018: Albumin 4.1 Lab Results  Component Value Date   AST 16 07/28/2018   ALT 16 07/28/2018   Other: 07/28/2018: CRP <0.8; Sed Rate 8 Note: Above Lab results reviewed.  Imaging  Last 90 days:  Dg Pain Clinic C-arm 1-60 Min No Report  Result Date: 05/09/2019 Fluoro was used, but no Radiologist interpretation will be provided. Please refer to "NOTES" tab for provider progress note.  Dg Pain Clinic C-arm 1-60 Min No Report  Result Date: 03/30/2019 Fluoro was used, but no Radiologist interpretation will be provided. Please refer to "NOTES" tab for provider progress note.   Assessment  The primary  encounter diagnosis was Lumbar facet arthropathy. A diagnosis of Lumbar spondylosis was also pertinent to this visit.  Plan of Care  I am having Mackenzie [name] maintain her ferrous sulfate, hydrochlorothiazide, lovastatin, cyclobenzaprine, pantoprazole, sucralfate, cholecalciferol, polyethylene glycol, cyanocobalamin, ALPRAZolam, diclofenac, Melatonin, DULoxetine, gabapentin, and clobetasol ointment.  Status post positive diagnostic lumbar facet medial branch nerve blocks at L3, L4, L5, S1 bilaterally.  States that she experienced approximately 60% pain relief for 5 to 7 days with gradual return of pain thereafter.  She noted improvement in her low back pain and functional status during that week.  We discussed proceeding with block #2 and radiofrequency ablation thereafter.  Risks and benefits reviewed, patient would like to proceed.  Orders:  Orders Placed This Encounter  Procedures  . LUMBAR FACET(MEDIAL BRANCH NERVE BLOCK) MBNB    Standing Status:   Future    Standing Expiration Date:   07/06/2019    Scheduling Instructions:     Side: Bilateral     Level: L3-4, L4-5, & L5-S1 Facets  L3, L4, L5, & S1 Medial Branch Nerves)     Sedation: with     Timeframe: ASAA    Order Specific Question:   Where will this procedure be performed?    Answer:   [facility]   Follow-up plan:   Return in about 1 week (around 06/13/2019) for Procedure B/L L3,4,5 S1 Fcts #2 with sedation.     Hx of R hip replacement, s/p R L4/5 ESI #1, not effective. Trial of lumbar facet diagnostic blocks at L3,4,5 S1 b/l on 05/09/2019, helpful, repeat #2.  In the future consider SI-J     Recent Visits Date Type Provider Dept  05/09/19 Procedure visit Gillis Santa, MD [facility]  04/26/19 Office Visit Gillis Santa, MD [facility]  03/30/19 Procedure visit Gillis Santa, MD [facility]  03/22/19 Office Visit Gillis Santa, MD [facility]  Showing recent visits within past 90  days and meeting all other requirements   Today's Visits Date Type Provider Dept  06/06/19 Office Visit Gillis Santa, MD [facility]  Showing today's visits and meeting all other requirements   Future Appointments No visits were found meeting these conditions.  Showing future appointments within next 90 days and meeting all other requirements   I discussed the assessment and treatment plan with the patient. The patient was provided an opportunity to ask questions and all were answered. The patient agreed with the plan and demonstrated an understanding of the instructions.  Patient advised to call back or seek an in-person evaluation if the symptoms or condition worsens.  Total duration of non-face-to-face encounter: 15 minutes.  Note by: Gillis Santa, MD Date: 06/06/2019; Time: 3:17 PM  Note: This dictation was prepared with Dragon dictation. Any transcriptional errors that may result from this process are unintentional.  Disclaimer:  * Given the special circumstances of the COVID-19 pandemic, the federal government has announced that the Office for Civil Rights (OCR) will exercise its enforcement discretion and will not impose penalties on physicians using telehealth in the event of noncompliance with regulatory requirements under the [place] (HIPAA) in connection with the good faith provision of telehealth during the XX123456 national public health emergency. ([place])

## 2019-06-07 ENCOUNTER — Encounter: Payer: Self-pay | Admitting: Obstetrics and Gynecology

## 2019-06-07 ENCOUNTER — Telehealth: Payer: Self-pay | Admitting: Obstetrics and Gynecology

## 2019-06-07 LAB — HSV 2 ANTIBODY, IGG: HSV 2 IgG, Type Spec: 2.36 index — ABNORMAL HIGH (ref 0.00–0.90)

## 2019-06-07 LAB — HSV-2 IGG SUPPLEMENTAL TEST: HSV-2 IgG Supplemental Test: POSITIVE — AB

## 2019-06-07 MED ORDER — VALACYCLOVIR HCL 500 MG PO TABS: 500.0000 mg | ORAL_TABLET | Freq: Two times a day (BID) | ORAL | 1 refills | Status: AC

## 2019-06-07 NOTE — Telephone Encounter (Signed)
Pt aware of pos type 2 IgG after neg culture for vaginal lesion. Pt not sex active currently. Discussed daily vs episodic tx. Prefers prn tx. Rx valtrex. F/u prn.

## 2019-06-13 ENCOUNTER — Encounter: Payer: Self-pay | Admitting: Student in an Organized Health Care Education/Training Program

## 2019-06-13 ENCOUNTER — Other Ambulatory Visit: Payer: Self-pay

## 2019-06-13 ENCOUNTER — Other Ambulatory Visit: Payer: Self-pay | Admitting: Student in an Organized Health Care Education/Training Program

## 2019-06-13 ENCOUNTER — Ambulatory Visit
Admission: RE | Admit: 2019-06-13 | Discharge: 2019-06-13 | Disposition: A | Discharge status: Home / Self Care | Payer: BC Managed Care – PPO | Source: Ambulatory Visit | Attending: Student in an Organized Health Care Education/Training Program | Admitting: Student in an Organized Health Care Education/Training Program

## 2019-06-13 ENCOUNTER — Ambulatory Visit (HOSPITAL_BASED_OUTPATIENT_CLINIC_OR_DEPARTMENT_OTHER): Payer: BC Managed Care – PPO | Admitting: Student in an Organized Health Care Education/Training Program

## 2019-06-13 VITALS — BP 144/69 | HR 59 | Temp 98.0°F | Resp 16 | Ht 68.0 in | Wt 254.0 lb

## 2019-06-13 DIAGNOSIS — M47816 Spondylosis without myelopathy or radiculopathy, lumbar region: Secondary | ICD-10-CM

## 2019-06-13 MED ORDER — LIDOCAINE HCL 2 % IJ SOLN
20.0000 mL | Freq: Once | INTRAMUSCULAR | Status: AC
  Administered 2019-06-13: 10:00:00 400 mg

## 2019-06-13 MED ORDER — FENTANYL CITRATE (PF) 100 MCG/2ML IJ SOLN
25.0000 ug | INTRAMUSCULAR | Status: DC | PRN
  Administered 2019-06-13: 75 ug via INTRAVENOUS

## 2019-06-13 MED ORDER — ROPIVACAINE HCL 2 MG/ML IJ SOLN
INTRAMUSCULAR | Status: AC
  Filled 2019-06-13: qty 20

## 2019-06-13 MED ORDER — LIDOCAINE HCL 2 % IJ SOLN
INTRAMUSCULAR | Status: AC
  Filled 2019-06-13: qty 20

## 2019-06-13 MED ORDER — DEXAMETHASONE SODIUM PHOSPHATE 10 MG/ML IJ SOLN
INTRAMUSCULAR | Status: AC
  Filled 2019-06-13: qty 2

## 2019-06-13 MED ORDER — DEXAMETHASONE SODIUM PHOSPHATE 10 MG/ML IJ SOLN
10.0000 mg | Freq: Once | INTRAMUSCULAR | Status: AC
  Administered 2019-06-13: 10:00:00 10 mg

## 2019-06-13 MED ORDER — IOHEXOL 180 MG/ML  SOLN: 10.0000 mL | Freq: Once | INTRAMUSCULAR | Status: DC

## 2019-06-13 MED ORDER — DEXAMETHASONE SODIUM PHOSPHATE 10 MG/ML IJ SOLN
10.0000 mg | Freq: Once | INTRAMUSCULAR | Status: AC
  Administered 2019-06-13: 10 mg

## 2019-06-13 MED ORDER — ROPIVACAINE HCL 2 MG/ML IJ SOLN
2.0000 mL | Freq: Once | INTRAMUSCULAR | Status: AC
  Administered 2019-06-13: 10:00:00 10 mL via EPIDURAL

## 2019-06-13 MED ORDER — FENTANYL CITRATE (PF) 100 MCG/2ML IJ SOLN
INTRAMUSCULAR | Status: AC
  Filled 2019-06-13: qty 2

## 2019-06-13 MED ORDER — SODIUM CHLORIDE 0.9% FLUSH: 2.0000 mL | Freq: Once | INTRAVENOUS | Status: DC

## 2019-06-13 NOTE — Patient Instructions (Signed)

## 2019-06-13 NOTE — Progress Notes (Signed)
Is patient's Name: Mackenzie [name]  MRN: [id number]  Referring Provider: Idelle Crouch, MD  DOB: 07-08-1956  PCP: Idelle Crouch, MD  DOS: 06/13/2019  Note by: Gillis Santa, MD  Service setting: Ambulatory outpatient  Specialty: Interventional Pain Management  Patient type: Established  Location: [facility] (AMB) Pain Management Facility  Visit type: Interventional Procedure   Primary Reason for Visit: Interventional Pain Management Treatment. CC: Back Pain (bilateral lumbar )  Procedure:          Anesthesia, Analgesia, Anxiolysis:  Type: Lumbar Facet, Medial Branch Block(s) #2  Primary Purpose: Diagnostic Region: Posterolateral Lumbosacral Spine Level: L3, L4, L5, & S1 Medial Branch Level(s). Injecting these levels blocks the L3-4, L4-5, and L5-S1 lumbar facet joints. Laterality: Bilateral  Type: Moderate (Conscious) Sedation combined with Local Anesthesia Indication(s): Analgesia and Anxiety Route: Intravenous (IV) IV Access: Secured Sedation: Meaningful verbal contact was maintained at all times during the procedure  Local Anesthetic: Lidocaine 1-2%  Position: Prone   Indications: 1. Lumbar facet arthropathy    Pain Score: Pre-procedure: 0-No pain/10 Post-procedure: 0-No pain/10   Pre-op Assessment:  Mackenzie [name] is a 63 y.o. (year old), female patient, seen today for interventional treatment. 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 [name] has a current medication list which includes the following prescription(s): alprazolam, cholecalciferol, clobetasol ointment, cyanocobalamin, cyclobenzaprine, diclofenac, duloxetine, ferrous sulfate,  gabapentin, hydrochlorothiazide, lovastatin, melatonin, pantoprazole, polyethylene glycol, and sucralfate, and the following Facility-Administered Medications: fentanyl, iohexol, and sodium chloride flush. Her primarily concern today is the Back Pain (bilateral lumbar )  Initial Vital Signs:  Pulse/HCG Rate: (!) 59ECG Heart Rate: (!) 57 Temp: 98.3 F (36.8 C) Resp: 16 BP: 116/61 SpO2: 94 %  BMI: Estimated body mass index is 38.62 kg/m as calculated from the following:   Height as of this encounter: 5\' 8"  (1.727 m).   Weight as of this encounter: 254 lb (115.2 kg).  Risk Assessment: Allergies: Reviewed. She has No Known Allergies.  Allergy Precautions: None required Coagulopathies: Reviewed. None identified.  Blood-thinner therapy: None at this time Active Infection(s): Reviewed. None identified. Mackenzie [name] is afebrile  Site Confirmation: [name] was asked to confirm the procedure and laterality before marking the site Procedure checklist: Completed Consent: Before the procedure and under the influence of no sedative(s), amnesic(s), or anxiolytics, the patient was informed of the treatment options, risks and possible complications. To fulfill our ethical and legal obligations, as recommended by the American Medical Association's Code of Ethics, I have informed the patient of my clinical impression; the nature and purpose of the treatment or procedure; the risks, benefits, and possible complications of the intervention; the alternatives, including doing nothing; the risk(s) and benefit(s) of the alternative treatment(s) or procedure(s); and the risk(s) and benefit(s) of doing nothing. The patient was provided information about the general risks and possible complications associated with the procedure. These may include, but are not limited to: failure to achieve desired goals, infection, bleeding, organ or nerve damage, allergic reactions, paralysis, and death. In addition, the patient was  informed of those risks and complications associated to Spine-related procedures, such as failure to decrease pain; infection (i.e.: Meningitis, epidural or intraspinal abscess); bleeding (i.e.: epidural hematoma, subarachnoid hemorrhage, or any other type of intraspinal  or peri-dural bleeding); organ or nerve damage (i.e.: Any type of peripheral nerve, nerve root, or spinal cord injury) with subsequent damage to sensory, motor, and/or autonomic systems, resulting in permanent pain, numbness, and/or weakness of one or several areas of the body; allergic reactions; (i.e.: anaphylactic reaction); and/or death. Furthermore, the patient was informed of those risks and complications associated with the medications. These include, but are not limited to: allergic reactions (i.e.: anaphylactic or anaphylactoid reaction(s)); adrenal axis suppression; blood sugar elevation that in diabetics may result in ketoacidosis or comma; water retention that in patients with history of congestive heart failure may result in shortness of breath, pulmonary edema, and decompensation with resultant heart failure; weight gain; swelling or edema; medication-induced neural toxicity; particulate matter embolism and blood vessel occlusion with resultant organ, and/or nervous system infarction; and/or aseptic necrosis of one or more joints. Finally, the patient was informed that Medicine is not an exact science; therefore, there is also the possibility of unforeseen or unpredictable risks and/or possible complications that may result in a catastrophic outcome. The patient indicated having understood very clearly. We have given the patient no guarantees and we have made no promises. Enough time was given to the patient to ask questions, all of which were answered to the patient's satisfaction. Mackenzie [name] has indicated that she wanted to continue with the procedure. Attestation: I, the ordering provider, attest that I have discussed with the  patient the benefits, risks, side-effects, alternatives, likelihood of achieving goals, and potential problems during recovery for the procedure that I have provided informed consent. Date   Time: 06/13/2019  9:28 AM  Pre-Procedure Preparation:  Monitoring: As per clinic protocol. Respiration, ETCO2, SpO2, BP, heart rate and rhythm monitor placed and checked for adequate function Safety Precautions: Patient was assessed for positional comfort and pressure points before starting the procedure. Time-out: I initiated and conducted the "Time-out" before starting the procedure, as per protocol. The patient was asked to participate by confirming the accuracy of the "Time Out" information. Verification of the correct person, site, and procedure were performed and confirmed by me, the nursing staff, and the patient. "Time-out" conducted as per Joint Commission's Universal Protocol (UP.01.01.01). Time: 1009  Description of Procedure:          Laterality: Bilateral. The procedure was performed in identical fashion on both sides. Levels:  L3, L4, L5, & S1 Medial Branch Level(s) Area Prepped: Posterior Lumbosacral Region Prepping solution: DuraPrep (Iodine Povacrylex [0.7% available iodine] and Isopropyl Alcohol, 74% w/w) Safety Precautions: Aspiration looking for blood return was conducted prior to all injections. At no point did we inject any substances, as a needle was being advanced. Before injecting, the patient was told to immediately notify me if she was experiencing any new onset of "ringing in the ears, or metallic taste in the mouth". No attempts were made at seeking any paresthesias. Safe injection practices and needle disposal techniques used. Medications properly checked for expiration dates. SDV (single dose vial) medications used. After the completion of the procedure, all disposable equipment used was discarded in the proper designated medical waste containers. Local Anesthesia: Protocol guidelines  were followed. The patient was positioned over the fluoroscopy table. The area was prepped in the usual manner. The time-out was completed. The target area was identified using fluoroscopy. A 12-in long, straight, sterile hemostat was used with fluoroscopic guidance to locate the targets for each level blocked. Once located, the skin was marked with an approved surgical skin marker. Once all sites were  marked, the skin (epidermis, dermis, and hypodermis), as well as deeper tissues (fat, connective tissue and muscle) were infiltrated with a small amount of a short-acting local anesthetic, loaded on a 10cc syringe with a 25G, 1.5-in  Needle. An appropriate amount of time was allowed for local anesthetics to take effect before proceeding to the next step. Local Anesthetic: Lidocaine 2.0% The unused portion of the local anesthetic was discarded in the proper designated containers. Technical explanation of process:   L3 Medial Branch Nerve Block (MBB): The target area for the L3 medial branch is at the junction of the postero-lateral aspect of the superior articular process and the superior, posterior, and medial edge of the transverse process of L4. Under fluoroscopic guidance, a Quincke needle was inserted until contact was made with os over the superior postero-lateral aspect of the pedicular shadow (target area). After negative aspiration for blood, 1 mL of the nerve block solution was injected without difficulty or complication. The needle was removed intact. L4 Medial Branch Nerve Block (MBB): The target area for the L4 medial branch is at the junction of the postero-lateral aspect of the superior articular process and the superior, posterior, and medial edge of the transverse process of L5. Under fluoroscopic guidance, a Quincke needle was inserted until contact was made with os over the superior postero-lateral aspect of the pedicular shadow (target area). After negative aspiration for blood, 31mL of the  nerve block solution was injected without difficulty or complication. The needle was removed intact. L5 Medial Branch Nerve Block (MBB): The target area for the L5 medial branch is at the junction of the postero-lateral aspect of the superior articular process and the superior, posterior, and medial edge of the sacral ala. Under fluoroscopic guidance, a Quincke needle was inserted until contact was made with os over the superior postero-lateral aspect of the pedicular shadow (target area). After negative aspiration for blood, 6mL of the nerve block solution was injected without difficulty or complication. The needle was removed intact. S1 Medial Branch Nerve Block (MBB): The target area for the S1 medial branch is at the posterior and inferior 6 o'clock position of the L5-S1 facet joint. Under fluoroscopic guidance, the Quincke needle inserted for the L5 MBB was redirected until contact was made with os over the inferior and postero aspect of the sacrum, at the 6 o' clock position under the L5-S1 facet joint (Target area). After negative aspiration for blood, 51mL of the nerve block solution was injected without difficulty or complication. The needle was removed intact.  Nerve block solution: 10 cc solution made of 8 cc of 0.2% ropivacaine, 2 cc of Decadron 10 mg/cc.  1-1.5 cc injected at each level above bilaterally.  The unused portion of the solution was discarded in the proper designated containers. Procedural Needles: 22-gauge, 5-inch, Quincke needles used for all levels.  Once the entire procedure was completed, the treated area was cleaned, making sure to leave some of the prepping solution back to take advantage of its long term bactericidal properties.   Illustration of the posterior view of the lumbar spine and the posterior neural structures. Laminae of L2 through S1 are labeled. DPRL5, dorsal primary ramus of L5; DPRS1, dorsal primary ramus of S1; DPR3, dorsal primary ramus of L3; FJ, facet  (zygapophyseal) joint L3-L4; I, inferior articular process of L4; LB1, lateral branch of dorsal primary ramus of L1; IAB, inferior articular branches from L3 medial branch (supplies L4-L5 facet joint); IBP, intermediate branch plexus; MB3, medial branch of  dorsal primary ramus of L3; NR3, third lumbar nerve root; S, superior articular process of L5; SAB, superior articular branches from L4 (supplies L4-5 facet joint also); TP3, transverse process of L3.  Vitals:   06/13/19 1027 06/13/19 1034 06/13/19 1044 06/13/19 1054  BP: 131/71 133/69 130/71 (!) 144/69  Pulse:      Resp: 12 12 14 16   Temp:  98 F (36.7 C)    TempSrc:      SpO2: 94% 94% 95% 96%  Weight:      Height:         Start Time: 1009 hrs. End Time: 1026 hrs.  Imaging Guidance (Spinal):          Type of Imaging Technique: Fluoroscopy Guidance (Spinal) Indication(s): Assistance in needle guidance and placement for procedures requiring needle placement in or near specific anatomical locations not easily accessible without such assistance. Exposure Time: Please see nurses notes. Contrast: None used. Fluoroscopic Guidance: I was personally present during the use of fluoroscopy. "Tunnel Vision Technique" used to obtain the best possible view of the target area. Parallax error corrected before commencing the procedure. "Direction-depth-direction" technique used to introduce the needle under continuous pulsed fluoroscopy. Once target was reached, antero-posterior, oblique, and lateral fluoroscopic projection used confirm needle placement in all planes. Images permanently stored in EMR. Interpretation: No contrast injected. I personally interpreted the imaging intraoperatively. Adequate needle placement confirmed in multiple planes. Permanent images saved into the patient's record.  Antibiotic Prophylaxis:   Anti-infectives (From admission, onward)   None     Indication(s): None identified  Post-operative Assessment:    Post-procedure Vital Signs:  Pulse/HCG Rate: (!) 59(!) 56 Temp: 98 F (36.7 C) Resp: 16 BP: (!) 144/69 SpO2: 96 %  EBL: None  Complications: No immediate post-treatment complications observed by team, or reported by patient.  Note: The patient tolerated the entire procedure well. A repeat set of vitals were taken after the procedure and the patient was kept under observation following institutional policy, for this type of procedure. Post-procedural neurological assessment was performed, showing return to baseline, prior to discharge. The patient was provided with post-procedure discharge instructions, including a section on how to identify potential problems. Should any problems arise concerning this procedure, the patient was given instructions to immediately contact us, at any time, without hesitation. In any case, we plan to contact the patient by telephone for a follow-up status report regarding this interventional procedure.  Comments:  No additional relevant information.  Plan of Care  Orders:  Orders Placed This Encounter  Procedures   DG PAIN CLINIC C-ARM 1-60 MIN NO REPORT    Intraoperative interpretation by procedural physician at [place].    Standing Status:   Standing    Number of Occurrences:   1    Order Specific Question:   Reason for exam:    Answer:   Assistance in needle guidance and placement for procedures requiring needle placement in or near specific anatomical locations not easily accessible without such assistance.  Medications ordered for procedure: Meds ordered this encounter  Medications   iohexol (OMNIPAQUE) 180 MG/ML injection 10 mL    Must be Myelogram-compatible. If not available, you may substitute with a water-soluble, non-ionic, hypoallergenic, myelogram-compatible radiological contrast medium.   lidocaine (XYLOCAINE) 2 % (with pres) injection 400 mg   fentaNYL (SUBLIMAZE) injection 25-50 mcg    Make sure Narcan is available in  the pyxis when using this medication. In the event of respiratory depression (RR< 8/min): Titrate  NARCAN (naloxone) in increments of 0.1 to 0.2 mg IV at 2-3 minute intervals, until desired degree of reversal.   ropivacaine (PF) 2 mg/mL (0.2%) (NAROPIN) injection 2 mL   sodium chloride flush (NS) 0.9 % injection 2 mL   dexamethasone (DECADRON) injection 10 mg   dexamethasone (DECADRON) injection 10 mg   ropivacaine (PF) 2 mg/mL (0.2%) (NAROPIN) injection 2 mL   Medications administered: We administered lidocaine, fentaNYL, ropivacaine (PF) 2 mg/mL (0.2%), dexamethasone, dexamethasone, and ropivacaine (PF) 2 mg/mL (0.2%).  See the medical record for exact dosing, route, and time of administration.  Follow-up plan:   Return in about 4 weeks (around 07/11/2019) for Post Procedure Evaluation, virtual.      Hx of R hip replacement, s/p R L4/5 ESI #1, not effective. Trial of lumbar facet diagnostic blocks at L3,4,5 S1 b/l on 05/09/2019 and 06/13/2019, consider L-fct RFA, SI-J    Recent Visits Date Type Provider Dept  06/06/19 Office Visit Gillis Santa, MD [facility]  05/09/19 Procedure visit Gillis Santa, MD [facility]  04/26/19 Office Visit Gillis Santa, MD [facility]  03/30/19 Procedure visit Gillis Santa, MD [facility]  03/22/19 Office Visit Gillis Santa, MD [facility]  Showing recent visits within past 90 days and meeting all other requirements   Today's Visits Date Type Provider Dept  06/13/19 Procedure visit Gillis Santa, MD [facility]  Showing today's visits and meeting all other requirements   Future Appointments Date Type Provider Dept  07/11/19 Appointment Gillis Santa, MD [facility]  Showing future appointments within next 90 days and meeting all other requirements   Disposition: Discharge home  Discharge Date & Time: 06/13/2019; 1056 hrs.   Primary Care Physician: Idelle Crouch,  MD Location: [facility] Note by: Gillis Santa, MD Date: 06/13/2019; Time: 1:31 PM  Disclaimer:  Medicine is not an exact science. The only guarantee in medicine is that nothing is guaranteed. It is important to note that the decision to proceed with this intervention was based on the information collected from the patient. The Data and conclusions were drawn from the patient's questionnaire, the interview, and the physical examination. Because the information was provided in large part by the patient, it cannot be guaranteed that it has not been purposely or unconsciously manipulated. Every effort has been made to obtain as much relevant data as possible for this evaluation. It is important to note that the conclusions that lead to this procedure are derived in large part from the available data. Always take into account that the treatment will also be dependent on availability of resources and existing treatment guidelines, considered by other Pain Management Practitioners as being common knowledge and practice, at the time of the intervention. For Medico-Legal purposes, it is also important to point out that variation in procedural techniques and pharmacological choices are the acceptable norm. The indications, contraindications, technique, and results of the above procedure should only be interpreted and judged by a Board-Certified Interventional Pain Specialist with extensive familiarity and expertise in the same exact procedure and technique.

## 2019-06-13 NOTE — Progress Notes (Signed)
Safety precautions to be maintained throughout the outpatient stay will include: orient to surroundings, keep bed in low position, maintain call bell within reach at all times, provide assistance with transfer out of bed and ambulation.  

## 2019-06-14 ENCOUNTER — Other Ambulatory Visit: Payer: Self-pay | Admitting: Obstetrics and Gynecology

## 2019-06-14 NOTE — Telephone Encounter (Signed)
Please advise 

## 2019-06-16 ENCOUNTER — Encounter: Payer: Self-pay | Admitting: Student in an Organized Health Care Education/Training Program

## 2019-06-16 ENCOUNTER — Other Ambulatory Visit: Payer: Self-pay | Admitting: Student in an Organized Health Care Education/Training Program

## 2019-06-16 DIAGNOSIS — G894 Chronic pain syndrome: Secondary | ICD-10-CM

## 2019-06-16 MED ORDER — DICLOFENAC SODIUM 75 MG PO TBEC: 75.0000 mg | DELAYED_RELEASE_TABLET | Freq: Two times a day (BID) | ORAL | 2 refills | Status: DC

## 2019-06-16 MED ORDER — DULOXETINE HCL 30 MG PO CPEP: 30.0000 mg | ORAL_CAPSULE | Freq: Every day | ORAL | 2 refills | Status: DC

## 2019-06-16 NOTE — Progress Notes (Signed)
Spoke with patient to let her know that Rx's has been sent.

## 2019-07-01 ENCOUNTER — Other Ambulatory Visit: Payer: Self-pay | Admitting: Student in an Organized Health Care Education/Training Program

## 2019-07-01 DIAGNOSIS — M47816 Spondylosis without myelopathy or radiculopathy, lumbar region: Secondary | ICD-10-CM

## 2019-07-03 ENCOUNTER — Other Ambulatory Visit: Payer: Self-pay | Admitting: Obstetrics and Gynecology

## 2019-07-07 ENCOUNTER — Encounter: Payer: Self-pay | Admitting: Student in an Organized Health Care Education/Training Program

## 2019-07-07 DIAGNOSIS — R0602 Shortness of breath: Secondary | ICD-10-CM | POA: Insufficient documentation

## 2019-07-07 DIAGNOSIS — R600 Localized edema: Secondary | ICD-10-CM | POA: Insufficient documentation

## 2019-07-07 NOTE — Progress Notes (Signed)
Patient reports now using a cpap for sleep.

## 2019-07-11 ENCOUNTER — Ambulatory Visit
Payer: BC Managed Care – PPO | Attending: Student in an Organized Health Care Education/Training Program | Admitting: Student in an Organized Health Care Education/Training Program

## 2019-07-11 ENCOUNTER — Encounter: Payer: Self-pay | Admitting: Student in an Organized Health Care Education/Training Program

## 2019-07-11 ENCOUNTER — Other Ambulatory Visit: Payer: Self-pay

## 2019-07-11 DIAGNOSIS — Z9989 Dependence on other enabling machines and devices: Secondary | ICD-10-CM | POA: Insufficient documentation

## 2019-07-11 DIAGNOSIS — G894 Chronic pain syndrome: Secondary | ICD-10-CM | POA: Diagnosis not present

## 2019-07-11 DIAGNOSIS — M47816 Spondylosis without myelopathy or radiculopathy, lumbar region: Secondary | ICD-10-CM | POA: Diagnosis not present

## 2019-07-11 DIAGNOSIS — G4733 Obstructive sleep apnea (adult) (pediatric): Secondary | ICD-10-CM | POA: Insufficient documentation

## 2019-07-11 NOTE — Progress Notes (Signed)
Pain Management Virtual Encounter Note - Virtual Visit via [place] (real-time audio visits between healthcare provider and patient).   Patient's Phone No. & Preferred Pharmacy:  [phone] (home); [phone] (mobile); (Preferred) [phone] [email or web address]  [id number] [address], [place] [address] Phone: [phone] Fax: [phone]    Pre-screening note:  Our staff contacted Mackenzie [name] and offered her an "in person", "face-to-face" appointment versus a telephone encounter. She indicated preferring the telephone encounter, at this time.   Reason for Virtual Visit: COVID-19*  Social distancing based on CDC and AMA recommendations.   I contacted Mackenzie [name] on 07/11/2019 via video conference.      I clearly identified myself as Gillis Santa, MD. I verified that I was speaking with the correct person using two identifiers (Name: Mackenzie [name], and date of birth: [birth year]).  Advanced Informed Consent I sought verbal advanced consent from Mackenzie [name] for virtual visit interactions. I informed Mackenzie [name] of possible security and privacy concerns, risks, and limitations associated with providing "not-in-person" medical evaluation and management services. I also informed Mackenzie [name] of the availability of "in-person" appointments. Finally, I informed her that there would be a charge for the virtual visit and that she could be  personally, fully or partially, financially responsible for it. Mackenzie [name] expressed understanding and agreed to proceed.   Historic Elements   Mackenzie [name] is a 63 y.o. year old, female patient evaluated today after her last encounter by our practice on 06/13/2019. Mackenzie [name]  has a past medical history of ADD (attention deficit disorder), Anemia, Anxiety, Barrett's esophagus (2019), Cold sore, Colon polyps, COPD (chronic obstructive pulmonary disease) ([place]), Essential hypertension, Genital  herpes (05/2019), GERD (gastroesophageal reflux disease), History of hiatal hernia, Hyperlipidemia, Lichen sclerosus, Osteoarthritis, Other abnormal auditory perceptions, bilateral (2019), RLS (restless legs syndrome), Shingles (09/2018), and Vitamin B12 deficiency. She also  has a past surgical history that includes Bunionectomy (Right, 2010); Colonoscopy (2014); broken leg repair (Right); Colonoscopy with propofol (N/A, 05/24/2018); Esophagogastroduodenoscopy (egd) with propofol (N/A, 05/24/2018); Induced abortion; Plantar fascia surgery (Left, 2005); Breast cyst aspiration (Right, 1998); eye plugs (Bilateral); and Total hip arthroplasty (Right, 08/11/2018). Mackenzie [name] has a current medication list which includes the following prescription(s): alprazolam, cholecalciferol, clobetasol ointment, cyanocobalamin, cyclobenzaprine, diclofenac, duloxetine, ferrous sulfate, gabapentin, lovastatin, melatonin, pantoprazole, polyethylene glycol, sucralfate, hydrochlorothiazide, and valacyclovir. She  reports that she quit smoking about 3 years ago. She has never used smokeless tobacco. She reports current alcohol use. She reports that she does not use drugs. Mackenzie [name] has No Known Allergies.   HPI  Today, she is being contacted for a post-procedure assessment.   Evaluation of last interventional procedure  06/13/2019 Procedure:   Type: Lumbar Facet, Medial Branch Block(s) #2  Primary Purpose: Diagnostic Region: Posterolateral Lumbosacral Spine Level: L3, L4, L5, & S1 Medial Branch Level(s). Injecting these levels blocks the L3-4, L4-5, and L5-S1 lumbar facet joints. Laterality: Bilateral  Sedation: Please see nurses note for DOS. When no sedatives are used, the analgesic levels obtained are directly associated to the effectiveness of the local anesthetics. However, when sedation is provided, the level of analgesia obtained during the initial 1 hour following the intervention, is believed to be the result of a  combination of factors. These factors may include, but are not limited to: 1. The effectiveness of the local anesthetics used. 2. The effects of the analgesic(s) and/or anxiolytic(s) used. 3. The degree of discomfort  experienced by the patient at the time of the procedure. 4. The patients ability and reliability in recalling and recording the events. 5. The presence and influence of possible secondary gains and/or psychosocial factors. Reported result: Relief experienced during the 1st hour after the procedure: 100 % (Ultra-Short Term Relief)            Interpretative annotation: Clinically appropriate result. Analgesia during this period is likely to be Local Anesthetic and/or IV Sedative (Analgesic/Anxiolytic) related.          Effects of local anesthetic: The analgesic effects attained during this period are directly associated to the localized infiltration of local anesthetics and therefore cary significant diagnostic value as to the etiological location, or anatomical origin, of the pain. Expected duration of relief is directly dependent on the pharmacodynamics of the local anesthetic used. Long-acting (4-6 hours) anesthetics used.  Reported result: Relief during the next 4 to 6 hour after the procedure: 100 % (Short-Term Relief)            Interpretative annotation: Clinically appropriate result. Analgesia during this period is likely to be Local Anesthetic-related.          Long-term benefit: Defined as the period of time past the expected duration of local anesthetics (1 hour for short-acting and 4-6 hours for long-acting). With the possible exception of prolonged sympathetic blockade from the local anesthetics, benefits during this period are typically attributed to, or associated with, other factors such as analgesic sensory neuropraxia, antiinflammatory effects, or beneficial biochemical changes provided by agents other than the local anesthetics.  Reported result: Extended relief following  procedure: 100 %(pain is much better and patient feels that the procedures are really helping but hasn't hit the exact spot yet and pain is slowly coming back to severity that it was.) (Long-Term Relief)            Interpretative annotation: Clinically appropriate result. Good relief. No permanent benefit expected. Inflammation plays a part in the etiology to the pain.           UDS:  Summary  Date Value Ref Range Status  03/22/2019 Note  Final    Comment:    ==================================================================== Compliance Drug Analysis, Ur ==================================================================== Test                             Result       Flag       Units Drug Present and Declared for Prescription Verification   Alprazolam                     46           EXPECTED   ng/mg creat   Alpha-hydroxyalprazolam        166          EXPECTED   ng/mg creat    Source of alprazolam is a scheduled prescription medication. Alpha-    hydroxyalprazolam is an expected metabolite of alprazolam.   Gabapentin                     PRESENT      EXPECTED   Cyclobenzaprine                PRESENT      EXPECTED   Desmethylcyclobenzaprine       PRESENT      EXPECTED    Desmethylcyclobenzaprine is an expected metabolite of  cyclobenzaprine. Drug Absent but Declared for Prescription Verification   Amphetamine                    Not Detected UNEXPECTED ng/mg creat   Diclofenac                     Not Detected UNEXPECTED    Diclofenac, as indicated in the declared medication list, is not    always detected even when used as directed. ==================================================================== Test                      Result    Flag   Units      Ref Range   Creatinine              226              mg/dL      >=20 ==================================================================== Declared Medications:  The flagging and interpretation on this report are based on the   following declared medications.  Unexpected results may arise from  inaccuracies in the declared medications.  **Note: The testing scope of this panel includes these medications:  Alprazolam (Xanax)  Amphetamine (Adderall)  Cyclobenzaprine (Flexeril)  Gabapentin  **Note: The testing scope of this panel does not include small to  moderate amounts of these reported medications:  Diclofenac (Voltaren)  **Note: The testing scope of this panel does not include the  following reported medications:  Enoxaparin (Lovenox)  Hydrochlorothiazide (Hydrodiuril)  Iron  Lovastatin (Mevacor)  Multivitamin  Pantoprazole (Protonix)  Polyethylene Glycol (MiraLAX)  Potassium (Klor-Con)  Sucralfate (Carafate)  Topical  Vitamin B12  Vitamin D3 ==================================================================== For clinical consultation, please call [phone]. ====================================================================    Laboratory Chemistry Profile (12 mo)  Renal: 07/28/2018: BUN 16 08/11/2018: Creatinine, Ser 0.73  Lab Results  Component Value Date   GFRAA >60 08/11/2018   GFRNONAA >60 08/11/2018   Hepatic: 07/28/2018: Albumin 4.1 Lab Results  Component Value Date   AST 16 07/28/2018   ALT 16 07/28/2018   Other: 07/28/2018: CRP <0.8; Sed Rate 8 Note: Above Lab results reviewed.   Assessment  The primary encounter diagnosis was Lumbar spondylosis. Diagnoses of Lumbar facet arthropathy and Chronic pain syndrome were also pertinent to this visit.  Plan of Care  I am having Mackenzie [name] maintain her ferrous sulfate, hydrochlorothiazide, lovastatin, cyclobenzaprine, pantoprazole, sucralfate, cholecalciferol, polyethylene glycol, cyanocobalamin, ALPRAZolam, Melatonin, gabapentin, clobetasol ointment, DULoxetine, diclofenac, and valACYclovir.  Patient is status post 2 sets of diagnostic lumbar facet medial branch nerve blocks at L3, L4, L5, S1 on 05/09/2019, 06/13/2019.  Both of  these procedures provided her with significant pain relief for at least 3 weeks.  They resulted in improved range of motion and a decrease in her pain.  We discussed the next steps which would include lumbar radiofrequency ablation.  Risks and benefits reviewed and patient would like to proceed.  We will start with the right side first which is more painful.  Patient is scheduled to have bunion surgery next week.  I told her that this would not impact her radiofrequency ablation and that she can continue as planned.  Orders:  Orders Placed This Encounter  Procedures  . Radiofrequency,Lumbar    Standing Status:   Future    Standing Expiration Date:   01/07/2021    Scheduling Instructions:     Side(s): RIGHT     Level: L3-4, L4-5, & L5-S1 Facets (L3, L4, L5, & S1 Medial Branch Nerves)  Sedation: With Sedation     Scheduling Timeframe: As soon as pre-approved    Order Specific Question:   Where will this procedure be performed?    Answer:   [facility]   Follow-up plan:   Return in about 2 weeks (around 07/25/2019) for Procedure RIGHT L3, 4, 5, S1 RFA , with sedation.     Hx of R hip replacement, s/p R L4/5 ESI #1, not effective. Trial of lumbar facet diagnostic blocks at L3,4,5 S1 b/l on 05/09/2019 and 06/13/2019, start with L3,4,5, S1 RFA on RIGHT first     Recent Visits Date Type Provider Dept  06/13/19 Procedure visit Gillis Santa, MD [facility]  06/06/19 Office Visit Gillis Santa, MD [facility]  05/09/19 Procedure visit Gillis Santa, MD [facility]  04/26/19 Office Visit Gillis Santa, MD [facility]  Showing recent visits within past 90 days and meeting all other requirements   Today's Visits Date Type Provider Dept  07/11/19 Office Visit Gillis Santa, MD [facility]  Showing today's visits and meeting all other requirements   Future Appointments No visits were found meeting these conditions.  Showing future  appointments within next 90 days and meeting all other requirements   I discussed the assessment and treatment plan with the patient. The patient was provided an opportunity to ask questions and all were answered. The patient agreed with the plan and demonstrated an understanding of the instructions.  Patient advised to call back or seek an in-person evaluation if the symptoms or condition worsens.  Total duration of non-face-to-face encounter: 27minutes.  Note by: Gillis Santa, MD Date: 07/11/2019; Time: 2:56 PM  Note: This dictation was prepared with Dragon dictation. Any transcriptional errors that may result from this process are unintentional.  Disclaimer:  * Given the special circumstances of the COVID-19 pandemic, the federal government has announced that the Office for Civil Rights (OCR) will exercise its enforcement discretion and will not impose penalties on physicians using telehealth in the event of noncompliance with regulatory requirements under the [place] (HIPAA) in connection with the good faith provision of telehealth during the XX123456 national public health emergency. ([place])

## 2019-07-13 ENCOUNTER — Encounter: Payer: Self-pay | Admitting: *Deleted

## 2019-07-13 ENCOUNTER — Other Ambulatory Visit: Payer: Self-pay

## 2019-07-15 ENCOUNTER — Other Ambulatory Visit: Payer: Self-pay

## 2019-07-15 DIAGNOSIS — Z20822 Contact with and (suspected) exposure to covid-19: Secondary | ICD-10-CM

## 2019-07-16 LAB — NOVEL CORONAVIRUS, NAA: SARS-CoV-2, NAA: NOT DETECTED

## 2019-07-18 ENCOUNTER — Telehealth: Payer: Self-pay

## 2019-07-18 ENCOUNTER — Other Ambulatory Visit: Payer: BC Managed Care – PPO

## 2019-07-18 NOTE — Telephone Encounter (Signed)
Let's make sure her ECHO and pulmonary visit go ok. If they do, then we can proceed with RFA.

## 2019-07-18 NOTE — Telephone Encounter (Signed)
She had a heart ultrasound last week, due to shortness of breath and swelling of feet and legs and has an appointment with pulmonary on the 19th for wheezing. She goes the 11th for the results of the heart ultrasound. They she is having foot on the 08/11/19 and will be on crutches. I have the authorization for her RFA but she wanted to check and see if you wanted to wait until she got all her results from everything.

## 2019-07-18 NOTE — Telephone Encounter (Signed)
Dr. Lateef please advise. 

## 2019-07-18 NOTE — Telephone Encounter (Signed)
Notified patient to let us know results of the tests.

## 2019-07-29 ENCOUNTER — Encounter: Payer: Self-pay | Admitting: Student in an Organized Health Care Education/Training Program

## 2019-08-02 ENCOUNTER — Other Ambulatory Visit: Payer: Self-pay | Admitting: Podiatry

## 2019-08-03 ENCOUNTER — Encounter: Payer: Self-pay | Admitting: *Deleted

## 2019-08-03 ENCOUNTER — Other Ambulatory Visit: Payer: Self-pay

## 2019-08-08 ENCOUNTER — Other Ambulatory Visit
Admission: RE | Admit: 2019-08-08 | Discharge: 2019-08-08 | Disposition: A | Discharge status: Home / Self Care | Payer: BC Managed Care – PPO | Source: Ambulatory Visit | Attending: Podiatry | Admitting: Podiatry

## 2019-08-08 ENCOUNTER — Other Ambulatory Visit: Payer: Self-pay

## 2019-08-08 DIAGNOSIS — Z20828 Contact with and (suspected) exposure to other viral communicable diseases: Secondary | ICD-10-CM | POA: Diagnosis not present

## 2019-08-08 DIAGNOSIS — Z01812 Encounter for preprocedural laboratory examination: Secondary | ICD-10-CM | POA: Diagnosis not present

## 2019-08-08 LAB — SARS CORONAVIRUS 2 (TAT 6-24 HRS): SARS Coronavirus 2: NEGATIVE

## 2019-08-11 ENCOUNTER — Ambulatory Visit: Payer: BC Managed Care – PPO | Admitting: Anesthesiology

## 2019-08-11 ENCOUNTER — Encounter
Admission: RE | Disposition: A | Discharge status: Home / Self Care | Payer: Self-pay | Source: Home / Self Care | Attending: Podiatry

## 2019-08-11 ENCOUNTER — Other Ambulatory Visit: Payer: Self-pay

## 2019-08-11 ENCOUNTER — Ambulatory Visit
Admission: RE | Admit: 2019-08-11 | Discharge: 2019-08-11 | Disposition: A | Discharge status: Home / Self Care | Payer: BC Managed Care – PPO | Attending: Podiatry | Admitting: Podiatry

## 2019-08-11 DIAGNOSIS — M2012 Hallux valgus (acquired), left foot: Secondary | ICD-10-CM | POA: Insufficient documentation

## 2019-08-11 DIAGNOSIS — E669 Obesity, unspecified: Secondary | ICD-10-CM | POA: Diagnosis not present

## 2019-08-11 DIAGNOSIS — Z803 Family history of malignant neoplasm of breast: Secondary | ICD-10-CM | POA: Insufficient documentation

## 2019-08-11 DIAGNOSIS — F419 Anxiety disorder, unspecified: Secondary | ICD-10-CM | POA: Insufficient documentation

## 2019-08-11 DIAGNOSIS — G4730 Sleep apnea, unspecified: Secondary | ICD-10-CM | POA: Insufficient documentation

## 2019-08-11 DIAGNOSIS — Z833 Family history of diabetes mellitus: Secondary | ICD-10-CM | POA: Insufficient documentation

## 2019-08-11 DIAGNOSIS — Z79899 Other long term (current) drug therapy: Secondary | ICD-10-CM | POA: Diagnosis not present

## 2019-08-11 DIAGNOSIS — Z87891 Personal history of nicotine dependence: Secondary | ICD-10-CM | POA: Insufficient documentation

## 2019-08-11 DIAGNOSIS — K449 Diaphragmatic hernia without obstruction or gangrene: Secondary | ICD-10-CM | POA: Diagnosis not present

## 2019-08-11 DIAGNOSIS — K219 Gastro-esophageal reflux disease without esophagitis: Secondary | ICD-10-CM | POA: Insufficient documentation

## 2019-08-11 DIAGNOSIS — M899 Disorder of bone, unspecified: Secondary | ICD-10-CM | POA: Diagnosis not present

## 2019-08-11 DIAGNOSIS — Z811 Family history of alcohol abuse and dependence: Secondary | ICD-10-CM | POA: Insufficient documentation

## 2019-08-11 DIAGNOSIS — Z96641 Presence of right artificial hip joint: Secondary | ICD-10-CM | POA: Diagnosis not present

## 2019-08-11 DIAGNOSIS — Z6839 Body mass index (BMI) 39.0-39.9, adult: Secondary | ICD-10-CM | POA: Diagnosis not present

## 2019-08-11 DIAGNOSIS — I10 Essential (primary) hypertension: Secondary | ICD-10-CM | POA: Insufficient documentation

## 2019-08-11 DIAGNOSIS — G2581 Restless legs syndrome: Secondary | ICD-10-CM | POA: Diagnosis not present

## 2019-08-11 DIAGNOSIS — Q66212 Congenital metatarsus primus varus, left foot: Secondary | ICD-10-CM | POA: Diagnosis not present

## 2019-08-11 DIAGNOSIS — G8929 Other chronic pain: Secondary | ICD-10-CM | POA: Insufficient documentation

## 2019-08-11 DIAGNOSIS — G709 Myoneural disorder, unspecified: Secondary | ICD-10-CM | POA: Insufficient documentation

## 2019-08-11 DIAGNOSIS — M1990 Unspecified osteoarthritis, unspecified site: Secondary | ICD-10-CM | POA: Diagnosis not present

## 2019-08-11 DIAGNOSIS — E785 Hyperlipidemia, unspecified: Secondary | ICD-10-CM | POA: Diagnosis not present

## 2019-08-11 DIAGNOSIS — J449 Chronic obstructive pulmonary disease, unspecified: Secondary | ICD-10-CM | POA: Insufficient documentation

## 2019-08-11 HISTORY — DX: Sleep apnea, unspecified: G47.30

## 2019-08-11 HISTORY — DX: Presence of spectacles and contact lenses: Z97.3

## 2019-08-11 HISTORY — DX: Personal history of other diseases of the nervous system and sense organs: Z86.69

## 2019-08-11 HISTORY — PX: AIKEN OSTEOTOMY: SHX6331

## 2019-08-11 HISTORY — PX: HALLUX VALGUS AUSTIN: SHX6623

## 2019-08-11 HISTORY — PX: BONE EXCISION: SHX6730

## 2019-08-11 SURGERY — CORRECTION, HALLUX VALGUS
Anesthesia: General | Site: Toe | Laterality: Left

## 2019-08-11 MED ORDER — OXYCODONE-ACETAMINOPHEN 7.5-325 MG PO TABS: 1.0000 | ORAL_TABLET | ORAL | 0 refills | Status: DC | PRN

## 2019-08-11 MED ORDER — ONDANSETRON HCL 4 MG/2ML IJ SOLN
INTRAMUSCULAR | Status: DC | PRN
  Administered 2019-08-11: 4 mg via INTRAVENOUS

## 2019-08-11 MED ORDER — FENTANYL CITRATE (PF) 100 MCG/2ML IJ SOLN
25.0000 ug | INTRAMUSCULAR | Status: DC | PRN
  Administered 2019-08-11 (×3): 50 ug via INTRAVENOUS

## 2019-08-11 MED ORDER — KETAMINE HCL 50 MG/ML IJ SOLN
INTRAMUSCULAR | Status: DC | PRN
  Administered 2019-08-11: 25 mg via INTRAMUSCULAR

## 2019-08-11 MED ORDER — ACETAMINOPHEN 325 MG PO TABS: 325.0000 mg | ORAL_TABLET | ORAL | Status: DC | PRN

## 2019-08-11 MED ORDER — LACTATED RINGERS IV SOLN: 1000.0000 mL | Freq: Once | INTRAVENOUS | Status: DC

## 2019-08-11 MED ORDER — LIDOCAINE HCL (CARDIAC) PF 100 MG/5ML IV SOSY
PREFILLED_SYRINGE | INTRAVENOUS | Status: DC | PRN
  Administered 2019-08-11: 30 mg via INTRATRACHEAL

## 2019-08-11 MED ORDER — LACTATED RINGERS IV SOLN
INTRAVENOUS | Status: DC
  Administered 2019-08-11 (×2): via INTRAVENOUS

## 2019-08-11 MED ORDER — POVIDONE-IODINE 7.5 % EX SOLN
Freq: Once | CUTANEOUS | Status: AC
  Administered 2019-08-11: 09:00:00 via TOPICAL

## 2019-08-11 MED ORDER — FENTANYL CITRATE (PF) 100 MCG/2ML IJ SOLN
INTRAMUSCULAR | Status: DC | PRN
  Administered 2019-08-11 (×2): 100 ug via INTRAVENOUS

## 2019-08-11 MED ORDER — BUPIVACAINE HCL 0.25 % IJ SOLN
INTRAMUSCULAR | Status: DC | PRN
  Administered 2019-08-11: 5 mL

## 2019-08-11 MED ORDER — DEXAMETHASONE SODIUM PHOSPHATE 4 MG/ML IJ SOLN
INTRAMUSCULAR | Status: DC | PRN
  Administered 2019-08-11: 8 mg via INTRAVENOUS

## 2019-08-11 MED ORDER — BUPIVACAINE LIPOSOME 1.3 % IJ SUSP
INTRAMUSCULAR | Status: DC | PRN
  Administered 2019-08-11: 5 mL
  Administered 2019-08-11: 15 mL

## 2019-08-11 MED ORDER — OXYCODONE HCL 5 MG PO TABS
5.0000 mg | ORAL_TABLET | Freq: Once | ORAL | Status: AC | PRN
  Administered 2019-08-11: 5 mg via ORAL

## 2019-08-11 MED ORDER — ALBUTEROL SULFATE HFA 108 (90 BASE) MCG/ACT IN AERS
INHALATION_SPRAY | RESPIRATORY_TRACT | Status: DC | PRN
  Administered 2019-08-11: 2 via RESPIRATORY_TRACT

## 2019-08-11 MED ORDER — GLYCOPYRROLATE 0.2 MG/ML IJ SOLN
INTRAMUSCULAR | Status: DC | PRN
  Administered 2019-08-11: 0.2 mg via INTRAVENOUS

## 2019-08-11 MED ORDER — CEFAZOLIN SODIUM-DEXTROSE 2-4 GM/100ML-% IV SOLN
2.0000 g | INTRAVENOUS | Status: AC
  Administered 2019-08-11: 2 g via INTRAVENOUS

## 2019-08-11 MED ORDER — MIDAZOLAM HCL 5 MG/5ML IJ SOLN
INTRAMUSCULAR | Status: DC | PRN
  Administered 2019-08-11: 2 mg via INTRAVENOUS

## 2019-08-11 MED ORDER — PROPOFOL 10 MG/ML IV BOLUS
INTRAVENOUS | Status: DC | PRN
  Administered 2019-08-11: 200 mg via INTRAVENOUS

## 2019-08-11 MED ORDER — OXYCODONE HCL 5 MG/5ML PO SOLN: 5.0000 mg | Freq: Once | ORAL | Status: AC | PRN

## 2019-08-11 MED ORDER — ACETAMINOPHEN 160 MG/5ML PO SOLN: 325.0000 mg | ORAL | Status: DC | PRN

## 2019-08-11 SURGICAL SUPPLY — 57 items
APL SKNCLS STERI-STRIP NONHPOA (GAUZE/BANDAGES/DRESSINGS) ×2
BANDAGE ELASTIC 4 VELCRO NS (GAUZE/BANDAGES/DRESSINGS) ×4 IMPLANT
BENZOIN TINCTURE PRP APPL 2/3 (GAUZE/BANDAGES/DRESSINGS) ×4 IMPLANT
BIT DRILL 1.7 LNG CANN (DRILL) ×2 IMPLANT
BLADE OSC/SAGITTAL MD 5.5X18 (BLADE) ×2 IMPLANT
BLADE OSC/SAGITTAL MD 9X18.5 (BLADE) ×2 IMPLANT
BNDG CMPR 75X41 PLY HI ABS (GAUZE/BANDAGES/DRESSINGS) ×2
BNDG ELASTIC 4X5.8 VLCR STR LF (GAUZE/BANDAGES/DRESSINGS) ×2 IMPLANT
BNDG ESMARK 4X12 TAN STRL LF (GAUZE/BANDAGES/DRESSINGS) ×4 IMPLANT
BNDG GAUZE 4.5X4.1 6PLY STRL (MISCELLANEOUS) ×4 IMPLANT
BNDG STRETCH 4X75 STRL LF (GAUZE/BANDAGES/DRESSINGS) ×4 IMPLANT
CANISTER SUCT 1200ML W/VALVE (MISCELLANEOUS) ×4 IMPLANT
CLOSURE WOUND 1/4X4 (GAUZE/BANDAGES/DRESSINGS) ×1
COUNTERSINK HEADED 2.5 (ORTHOPEDIC DISPOSABLE SUPPLIES) ×4
COVER LIGHT HANDLE UNIVERSAL (MISCELLANEOUS) ×8 IMPLANT
COVER PIN YLW 0.028-062 (MISCELLANEOUS) IMPLANT
CUFF TOURN SGL QUICK 18X4 (TOURNIQUET CUFF) ×2 IMPLANT
DRAPE FLUOR MINI C-ARM 54X84 (DRAPES) ×4 IMPLANT
DURAPREP 26ML APPLICATOR (WOUND CARE) ×4 IMPLANT
ELECT REM PT RETURN 9FT ADLT (ELECTROSURGICAL) ×4
ELECTRODE REM PT RTRN 9FT ADLT (ELECTROSURGICAL) ×2 IMPLANT
GAUZE SPONGE 4X4 12PLY STRL (GAUZE/BANDAGES/DRESSINGS) ×4 IMPLANT
GAUZE XEROFORM 1X8 LF (GAUZE/BANDAGES/DRESSINGS) ×4 IMPLANT
GLOVE BIO SURGEON STRL SZ8 (GLOVE) ×4 IMPLANT
GOWN STRL REUS W/ TWL LRG LVL3 (GOWN DISPOSABLE) ×2 IMPLANT
GOWN STRL REUS W/ TWL XL LVL3 (GOWN DISPOSABLE) ×2 IMPLANT
GOWN STRL REUS W/TWL LRG LVL3 (GOWN DISPOSABLE) ×4
GOWN STRL REUS W/TWL XL LVL3 (GOWN DISPOSABLE) ×4
HEMOSTAT SURGICEL 2X3 (HEMOSTASIS) ×2 IMPLANT
KIT PROCEDURE DRILL (DRILL) ×2 IMPLANT
KIT TURNOVER KIT A (KITS) ×4 IMPLANT
NDL HYPO 18GX1.5 BLUNT FILL (NEEDLE) IMPLANT
NDL HYPO 25GX1X1/2 BEV (NEEDLE) IMPLANT
NEEDLE HYPO 18GX1.5 BLUNT FILL (NEEDLE) ×4 IMPLANT
NEEDLE HYPO 25GX1X1/2 BEV (NEEDLE) ×4 IMPLANT
NS IRRIG 500ML POUR BTL (IV SOLUTION) ×4 IMPLANT
PACK EXTREMITY ARMC (MISCELLANEOUS) ×4 IMPLANT
PENCIL SMOKE EVACUATOR (MISCELLANEOUS) ×4 IMPLANT
RASP SM TEAR CROSS CUT (RASP) ×2 IMPLANT
SCREW CANN SHRT THRD 2.5X17 (Screw) ×2 IMPLANT
SCREW CANN SHRT THRD 2.5X18 (Screw) ×2 IMPLANT
SCREW COUNTERSINK HEADED 2.5 (ORTHOPEDIC DISPOSABLE SUPPLIES) IMPLANT
SPLINT CAST 1 STEP 4X30 (MISCELLANEOUS) ×4 IMPLANT
SPLINT FAST PLASTER 5X30 (CAST SUPPLIES) ×2
SPLINT PLASTER CAST FAST 5X30 (CAST SUPPLIES) IMPLANT
STAPLE DYNACLIP 8X8 (Staple) ×2 IMPLANT
STOCKINETTE STRL 6IN 960660 (GAUZE/BANDAGES/DRESSINGS) ×4 IMPLANT
STRAP BODY AND KNEE 60X3 (MISCELLANEOUS) ×4 IMPLANT
STRIP CLOSURE SKIN 1/4X4 (GAUZE/BANDAGES/DRESSINGS) ×3 IMPLANT
SUT ETHILON 4 0 FS (SUTURE) ×2 IMPLANT
SUT VIC AB 3-0 SH 27 (SUTURE) ×4
SUT VIC AB 3-0 SH 27X BRD (SUTURE) IMPLANT
SUT VIC AB 4-0 FS2 27 (SUTURE) ×4 IMPLANT
SUT VIC AB 4-0 SH 27 (SUTURE) ×8
SUT VIC AB 4-0 SH 27XANBCTRL (SUTURE) IMPLANT
SYR 10ML LL (SYRINGE) ×2 IMPLANT
WIRE SMOOTH TROCAR .9MMX150MML (WIRE) ×4 IMPLANT

## 2019-08-11 NOTE — H&P (Signed)
H and P has been reviewed and no changes are noted.  

## 2019-08-11 NOTE — Anesthesia Postprocedure Evaluation (Signed)
Anesthesia Post Note  Patient: Mackenzie [name]  Procedure(s) Performed: Liane Comber (MITCHELL) (Left ) PHALANX OSTEOTOMY AKIN (Left Toe) SADDLEBONE (Left )     Patient location during evaluation: PACU Anesthesia Type: General Level of consciousness: awake and alert and oriented Pain management: pain level controlled Vital Signs Assessment: post-procedure vital signs reviewed and stable Respiratory status: nonlabored ventilation, spontaneous breathing and respiratory function stable Cardiovascular status: blood pressure returned to baseline and stable Postop Assessment: no headache and no backache Anesthetic complications: no    Sinda Du

## 2019-08-11 NOTE — Transfer of Care (Signed)
Immediate Anesthesia Transfer of Care Note  Patient: Mackenzie [name]  Procedure(s) Performed: Liane Comber (MITCHELL) (Left ) PHALANX OSTEOTOMY AKIN (Left Toe) SADDLEBONE (Left )  Patient Location: PACU  Anesthesia Type: General  Level of Consciousness: awake, alert  and patient cooperative  Airway and Oxygen Therapy: Patient Spontanous Breathing and Patient connected to supplemental oxygen  Post-op Assessment: Post-op Vital signs reviewed, Patient's Cardiovascular Status Stable, Respiratory Function Stable, Patent Airway and No signs of Nausea or vomiting  Post-op Vital Signs: Reviewed and stable  Complications: No apparent anesthesia complications

## 2019-08-11 NOTE — Discharge Instructions (Signed)
[place] DR. TROXLER, DR. Vickki Muff, AND DR. Ogden   1. Take your medication as prescribed.  Pain medication should be taken only as needed.  2. Keep the dressing clean, dry and intact.  3. Keep your foot elevated above the heart level for the first 48 hours.  4. Walking to the bathroom and brief periods of walking are acceptable, unless we have instructed you to be non-weight bearing.  5. Always wear your post-op shoe when walking.  Always use your crutches if you are to be non-weight bearing.  6. Do not take a shower. Baths are permissible as long as the foot is kept out of the water.   7. Every hour you are awake:  - Bend your knee 15 times. - Flex foot 15 times - Massage calf 15 times  8. Call [facility] [phone]) if any of the following problems occur: - You develop a temperature or fever. - The bandage becomes saturated with blood. - Medication does not stop your pain. - Injury of the foot occurs. - Any symptoms of infection including redness, odor, or red streaks running from wound.   General Anesthesia, Adult, Care After This sheet gives you information about how to care for yourself after your procedure. Your health care provider may also give you more specific instructions. If you have problems or questions, contact your health care provider. What can I expect after the procedure? After the procedure, the following side effects are common:  Pain or discomfort at the IV site.  Nausea.  Vomiting.  Sore throat.  Trouble concentrating.  Feeling cold or chills.  Weak or tired.  Sleepiness and fatigue.  Soreness and body aches. These side effects can affect parts of the body that were not involved in surgery. Follow these instructions at home:  For at least 24 hours after the procedure:  Have a responsible adult stay with you. It is  important to have someone help care for you until you are awake and alert.  Rest as needed.  Do not: ? Participate in activities in which you could fall or become injured. ? Drive. ? Use heavy machinery. ? Drink alcohol. ? Take sleeping pills or medicines that cause drowsiness. ? Make important decisions or sign legal documents. ? Take care of children on your own. Eating and drinking  Follow any instructions from your health care provider about eating or drinking restrictions.  When you feel hungry, start by eating small amounts of foods that are soft and easy to digest (bland), such as toast. Gradually return to your regular diet.  Drink enough fluid to keep your urine pale yellow.  If you vomit, rehydrate by drinking water, juice, or clear broth. General instructions  If you have sleep apnea, surgery and certain medicines can increase your risk for breathing problems. Follow instructions from your health care provider about wearing your sleep device: ? Anytime you are sleeping, including during daytime naps. ? While taking prescription pain medicines, sleeping medicines, or medicines that make you drowsy.  Return to your normal activities as told by your health care provider. Ask your health care provider what activities are safe for you.  Take over-the-counter and prescription medicines only as told by your health care provider.  If you smoke, do not smoke without supervision.  Keep all follow-up visits as told by your health care provider. This is important. Contact a health care provider if:  You have nausea or vomiting that does not get better with medicine.  You cannot eat or drink without vomiting.  You have pain that does not get better with medicine.  You are unable to pass urine.  You develop a skin rash.  You have a fever.  You have redness around your IV site that gets worse. Get help right away if:  You have difficulty breathing.  You have chest  pain.  You have blood in your urine or stool, or you vomit blood. Summary  After the procedure, it is common to have a sore throat or nausea. It is also common to feel tired.  Have a responsible adult stay with you for the first 24 hours after general anesthesia. It is important to have someone help care for you until you are awake and alert.  When you feel hungry, start by eating small amounts of foods that are soft and easy to digest (bland), such as toast. Gradually return to your regular diet.  Drink enough fluid to keep your urine pale yellow.  Return to your normal activities as told by your health care provider. Ask your health care provider what activities are safe for you. This information is not intended to replace advice given to you by your health care provider. Make sure you discuss any questions you have with your health care provider. Document Released: 12/01/2000 Document Revised: 08/28/2017 Document Reviewed: 04/10/2017 Elsevier Patient Education  2020 [employer] for Discharge Teaching: EXPAREL (bupivacaine liposome injectable suspension)   Your surgeon or anesthesiologist gave you EXPAREL(bupivacaine) to help control your pain after surgery.   EXPAREL is a local anesthetic that provides pain relief by numbing the tissue around the surgical site.  EXPAREL is designed to release pain medication over time and can control pain for up to 72 hours.  Depending on how you respond to EXPAREL, you may require less pain medication during your recovery.  Possible side effects:  Temporary loss of sensation or ability to move in the area where bupivacaine was injected.  Nausea, vomiting, constipation  Rarely, numbness and tingling in your mouth or lips, lightheadedness, or anxiety may occur.  Call your doctor right away if you think you may be experiencing any of these sensations, or if you have other questions regarding possible side effects.  Follow all  other discharge instructions given to you by your surgeon or nurse. Eat a healthy diet and drink plenty of water or other fluids.  If you return to the hospital for any reason within 96 hours following the administration of EXPAREL, it is important for health care providers to know that you have received this anesthetic. A teal colored band has been placed on your arm with the date, time and amount of EXPAREL you have received in order to alert and inform your health care providers. Please leave this armband in place for the full 96 hours following administration, and then you may remove the band.Information for Discharge Teaching: EXPAREL (bupivacaine liposome injectable suspension)   Your surgeon or anesthesiologist gave you EXPAREL(bupivacaine) to help control your pain after surgery.   EXPAREL is a local anesthetic that provides pain relief by numbing the tissue around the surgical site.  EXPAREL is designed to release pain medication over time and can control pain for up to 72 hours.  Depending on how you respond to EXPAREL, you may require less pain medication during your recovery.  Possible side effects:  Temporary loss of sensation or ability to  move in the area where bupivacaine was injected.  Nausea, vomiting, constipation  Rarely, numbness and tingling in your mouth or lips, lightheadedness, or anxiety may occur.  Call your doctor right away if you think you may be experiencing any of these sensations, or if you have other questions regarding possible side effects.  Follow all other discharge instructions given to you by your surgeon or nurse. Eat a healthy diet and drink plenty of water or other fluids.  If you return to the hospital for any reason within 96 hours following the administration of EXPAREL, it is important for health care providers to know that you have received this anesthetic. A teal colored band has been placed on your arm with the date, time and amount of  EXPAREL you have received in order to alert and inform your health care providers. Please leave this armband in place for the full 96 hours following administration, and then you may remove the band.

## 2019-08-11 NOTE — Op Note (Signed)
Operative note   Surgeon: Dr. Albertine Patricia, DPM.    Assistant: None    Preop diagnosis: 1.  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  Modified to have a long-arm [place] with 2 screw fixation supplemented with an Akin osteotomy to the proximal phalanx with staple fixation left   2.  Excision of exostosis from second third tarsometatarsal joints left       EBL: 12 cc    Anesthesia:general delivered by the anesthesia team.  I injected 10 cc of a 50-50 mixture of Marcaine and Exparel at the beginning of the case and another 15 cc of Exparel at the end of the case.  These were injected around the base of the operative sites.    Hemostasis: Ankle tourniquet 225 mils mercury pressure.  This was released at 68 minutes for about 15 minutes and then reinflated for another 21 minutes.    Specimen: None    Complications: None    Operative indications: Chronic pain unresponsive to conservative care.    Procedure:  Patient was brought into the OR and placed on the operating table in thesupine position. After anesthesia was obtained theleft lower extremity was prepped and draped in usual sterile fashion.  Operative Report: At this time attention directed to the first metatarsal and first metatarsal phalangeal joint where a 6 cm linear incision was made extending over the distal 3 force of the metatarsal and onto the proximal phalanx.  This was deepened sharp blunt dissection bleeders clamped and bovied as required.  At this time the periosteum capsule tissue was incised over the metatarsal phalangeal joint first metatarsal and proximal shaft of the proximal phalanx.  There is a large medial dorsal medial eminence of bone noted on the first metatarsal head this was resected graft smoothly.  There is also some dorsal spurring on the metatarsal that was resected and rasped  smoothly as well.  There was copiously irrigated.  This point a point 5 K wire was used as an apical axis guide and an osteotomy was performed in the metatarsal with a checkmark fashion with a short arm plantar in the dorsal arm longer.  Once this cut was through and through from medial to lateral the K wire was removed and attention was directed to the lateral aspect of the joint where an abductor tendon release fibular sesamoidal ligament release and lateral capsulotomy were performed.  This point the distal metatarsal head shaft was transferred to a more lateral position and checked FluoroScan excellent correction of the deformity was noted.  At this point 2 screws from the mini monster set were placed across the osteotomy these were the 2.9 screws headed.  There is checked FluoroScan good position correction and fixation were noted.  The remaining medial shelf of bone was then resected and rasped smoothly.  This time I opted to do a proximal phalanx osteotomy in a wedge fashion with apex lateral base medial in the proximal shaft.  Wedge was closed and fixated with a bone staple. This was checked FluoroScan good position and correction of the prior joint area was noted at this point.  There is an copiously irrigated.  At this time the periosteal capsule tissue was closed with 4-0 Vicryl in a continuous stitch.  Deep superficial fascia was closed with 4-0 Vicryl in a continuous stitch.  Skin was closed with 4-0 Vicryl  in a subcuticular fashion.  Procedure #2: At this time the dorsal bone prominence of the second third tarsometatarsal joints was palpated and marked.  Incision was made just lateral to the left to stay away from neurovascular structures.  Was then deepened with sharp blunt dissection bleeders clamped and bovied as required.  The extensor tendons were identified and separated and retracted medial laterally.  The bony prominence was palpated through the periosteum at this point and a  longitudinal incision was made and a combination of 15 blade and periosteal elevator was used to free the capsular periosteal tissue away from the bony exostosis.  Once this was accomplished the exostosis was excised and rasped smooth.  There was an copiously irrigated.  The area was palpated and seen at the significantly reduced.  After copious irrigation the deep periosteal tissue was closed with 4-0 Vicryl continuous stitch and the retinacular extensor tendon was closed with 4-0 Vicryl continuous stitch as well.  Deep superficial fascia was closed with 4-0 Vicryl in continuous stitch and skin was closed with 4-0 Vicryl in subcuticular fashion.  At this time there was blocked with the Exparel and sterile compressive dressing was placed across wound consisting of Steri-Strips Xeroform gauze 4 x 4's Kling and Kerlix.  The tourniquet was released earlier after removal of the exostosis just to check for bleeders once this was satisfactorily identified but no arterial bleeders were present the area was copiously irrigated and the tourniquet was reinflated for another 21 minutes.  Tourniquet was released after the dressing was put on and still bleeding and clot vascularity seen return to digits of the left foot    Patient tolerated the procedure and anesthesia well.  Was transported from the OR to the PACU with all vital signs stable and vascular status intact. To be discharged per routine protocol.  Will follow up in approximately 1 week in the outpatient clinic.

## 2019-08-11 NOTE — Anesthesia Preprocedure Evaluation (Signed)
Anesthesia Evaluation  Patient identified by MRN, date of birth, ID band Patient awake    Reviewed: Allergy & Precautions, NPO status , Patient's Chart, lab work & pertinent test results, reviewed documented beta blocker date and time   Airway Mallampati: III  TM Distance: >3 FB Neck ROM: Full    Dental no notable dental hx. (+) Chipped   Pulmonary sleep apnea , COPD, former smoker,    Pulmonary exam normal breath sounds clear to auscultation       Cardiovascular Exercise Tolerance: Good hypertension, Pt. on medications Normal cardiovascular exam Rhythm:Regular Rate:Normal     Neuro/Psych PSYCHIATRIC DISORDERS Anxiety  Neuromuscular disease    GI/Hepatic Neg liver ROS, hiatal hernia, GERD  ,  Endo/Other  negative endocrine ROS  Renal/GU negative Renal ROS     Musculoskeletal  (+) Arthritis ,   Abdominal (+) + obese,   Peds negative pediatric ROS (+)  Hematology  (+) Blood dyscrasia, anemia ,   Anesthesia Other Findings   Reproductive/Obstetrics                             Anesthesia Physical  Anesthesia Plan  ASA: III  Anesthesia Plan: General   Post-op Pain Management:    Induction: Intravenous  PONV Risk Score and Plan: 2 and Propofol infusion  Airway Management Planned: Nasal Cannula and Natural Airway  Additional Equipment:   Intra-op Plan:   Post-operative Plan:   Informed Consent: I have reviewed the patients History and Physical, chart, labs and discussed the procedure including the risks, benefits and alternatives for the proposed anesthesia with the patient or authorized representative who has indicated his/her understanding and acceptance.     Dental advisory given  Plan Discussed with: CRNA and Anesthesiologist  Anesthesia Plan Comments:         Anesthesia Quick Evaluation  Patient Active Problem List   Diagnosis Date Noted  . Lumbar radiculopathy  (R L5/S1) 03/22/2019  . Chronic radicular lumbar pain 03/22/2019  . Lumbar facet arthropathy 03/22/2019  . Lumbar spondylosis 03/22/2019  . Spinal stenosis, lumbar region, with neurogenic claudication 03/22/2019  . Lumbar degenerative disc disease 03/22/2019  . Sacroiliac joint pain 03/22/2019  . Chronic pain syndrome 03/22/2019  . Adenomatous colon polyp 08/11/2018  . Diverticulosis 08/11/2018  . Dyspnea on exertion 08/11/2018  . Environmental allergies 08/11/2018  . Ganglion of joint 08/11/2018  . Hiatal hernia 08/11/2018  . Localized, primary osteoarthritis of hand 08/11/2018  . Obesity 08/11/2018  . Osteoarthritis of knee 08/11/2018  . Hypertension 08/11/2018  . B12 deficiency 08/11/2018  . H/O total hip arthroplasty 08/11/2018  . Essential hypertension 02/08/2017  . Hyperlipidemia 02/08/2017  . COPD (chronic obstructive pulmonary disease) ([place]) 02/08/2017  . Anemia 02/08/2017  . Vitamin B12 deficiency 02/08/2017  . Lichen sclerosus et atrophicus 02/08/2017  . Adult ADHD 01/31/2016    CBC Latest Ref Rng & Units 08/11/2018 08/11/2018 07/28/2018  WBC 4.0 - 10.5 K/uL 10.3 - 4.8  Hemoglobin 12.0 - 15.0 g/dL 13.5 14.3 15.1(H)  Hematocrit 36.0 - 46.0 % 40.3 42.0 45.3  Platelets 150 - 400 K/uL 217 - 263   BMP Latest Ref Rng & Units 08/11/2018 07/28/2018  Glucose 70 - 99 mg/dL 106(H) 104(H)  BUN 6 - 20 mg/dL - 16  Creatinine 0.44 - 1.00 mg/dL 0.73 0.66  Sodium 135 - 145 mmol/L 139 141  Potassium 3.5 - 5.1 mmol/L 3.9 3.2(L)  Chloride 98 - 111 mmol/L - 103  CO2 22 - 32 mmol/L - 30  Calcium 8.9 - 10.3 mg/dL - 9.1   Risks and benefits of anesthesia discussed at length, patient or surrogate demonstrates understanding. Appropriately NPO. Plan to proceed with anesthesia.  Champ Mungo, MD 08/11/19

## 2019-08-11 NOTE — Anesthesia Procedure Notes (Signed)
Procedure Name: LMA Insertion Performed by: Izetta Dakin, CRNA Pre-anesthesia Checklist: Patient identified, Emergency Drugs available, Suction available, Timeout performed and Patient being monitored Oxygen Delivery Method: Circle system utilized Preoxygenation: Pre-oxygenation with 100% oxygen Induction Type: IV induction Ventilation: Mask ventilation without difficulty LMA: LMA inserted LMA Size: 3.0 Number of attempts: 1 Placement Confirmation: CO2 detector,  breath sounds checked- equal and bilateral and positive ETCO2 Tube secured with: Tape Dental Injury: Teeth and Oropharynx as per pre-operative assessment

## 2019-08-12 ENCOUNTER — Encounter: Payer: Self-pay | Admitting: Podiatry

## 2019-08-14 ENCOUNTER — Encounter: Payer: Self-pay | Admitting: Student in an Organized Health Care Education/Training Program

## 2019-08-15 ENCOUNTER — Other Ambulatory Visit: Payer: BC Managed Care – PPO

## 2019-08-31 ENCOUNTER — Ambulatory Visit (HOSPITAL_BASED_OUTPATIENT_CLINIC_OR_DEPARTMENT_OTHER): Payer: BC Managed Care – PPO | Admitting: Student in an Organized Health Care Education/Training Program

## 2019-08-31 ENCOUNTER — Other Ambulatory Visit: Payer: Self-pay

## 2019-08-31 ENCOUNTER — Ambulatory Visit
Admission: RE | Admit: 2019-08-31 | Discharge: 2019-08-31 | Disposition: A | Discharge status: Home / Self Care | Payer: BC Managed Care – PPO | Source: Ambulatory Visit | Attending: Student in an Organized Health Care Education/Training Program | Admitting: Student in an Organized Health Care Education/Training Program

## 2019-08-31 ENCOUNTER — Encounter: Payer: Self-pay | Admitting: Student in an Organized Health Care Education/Training Program

## 2019-08-31 DIAGNOSIS — M47816 Spondylosis without myelopathy or radiculopathy, lumbar region: Secondary | ICD-10-CM | POA: Insufficient documentation

## 2019-08-31 MED ORDER — ROPIVACAINE HCL 2 MG/ML IJ SOLN
9.0000 mL | Freq: Once | INTRAMUSCULAR | Status: AC
  Administered 2019-08-31: 09:00:00 9 mL via PERINEURAL
  Filled 2019-08-31: qty 10

## 2019-08-31 MED ORDER — DEXAMETHASONE SODIUM PHOSPHATE 10 MG/ML IJ SOLN
10.0000 mg | Freq: Once | INTRAMUSCULAR | Status: AC
  Administered 2019-08-31: 09:00:00 10 mg
  Filled 2019-08-31: qty 1

## 2019-08-31 MED ORDER — LIDOCAINE HCL 2 % IJ SOLN
20.0000 mL | Freq: Once | INTRAMUSCULAR | Status: AC
  Administered 2019-08-31: 09:00:00 400 mg
  Filled 2019-08-31: qty 40

## 2019-08-31 MED ORDER — DEXAMETHASONE SODIUM PHOSPHATE 10 MG/ML IJ SOLN
10.0000 mg | Freq: Once | INTRAMUSCULAR | Status: AC
  Administered 2019-08-31: 10 mg
  Filled 2019-08-31: qty 1

## 2019-08-31 MED ORDER — FENTANYL CITRATE (PF) 100 MCG/2ML IJ SOLN
25.0000 ug | INTRAMUSCULAR | Status: DC | PRN
  Administered 2019-08-31: 09:00:00 50 ug via INTRAVENOUS
  Filled 2019-08-31: qty 2

## 2019-08-31 NOTE — Patient Instructions (Addendum)
Post-procedure Information What to expect: Most procedures involve the use of a local anesthetic (numbing medicine), and a steroid (anti-inflammatory medicine).  The local anesthetics may cause temporary numbness and weakness of the legs or arms, depending on the location of the block. This numbness/weakness may last 4-6 hours, depending on the local anesthetic used. In rare instances, it can last up to 24 hours. While numb, you must be very careful not to injure the extremity.  After any procedure, you could expect the pain to get better within 15-20 minutes. This relief is temporary and may last 4-6 hours. Once the local anesthetics wears off, you could experience discomfort, possibly more than usual, for up to 10 (ten) days. In the case of radiofrequencies, it may last up to 6 weeks. Surgeries may take up to 8 weeks for the healing process. The discomfort is due to the irritation caused by needles going through skin and muscle. To minimize the discomfort, we recommend using ice the first day, and heat from then on. The ice should be applied for 15 minutes on, and 15 minutes off. Keep repeating this cycle until bedtime. Avoid applying the ice directly to the skin, to prevent frostbite. Heat should be used daily, until the pain improves (4-10 days). Be careful not to burn yourself.  Occasionally you may experience muscle spasms or cramps. These occur as a consequence of the irritation caused by the needle sticks to the muscle and the blood that will inevitably be lost into the surrounding muscle tissue. Blood tends to be very irritating to tissues, which tend to react by going into spasm. These spasms may start the same day of your procedure, but they may also take days to develop. This late onset type of spasm or cramp is usually caused by electrolyte imbalances triggered by the steroids, at the level of the kidney. Cramps and spasms tend to respond well to muscle relaxants, multivitamins (some are  triggered by the procedure, but may have their origins in vitamin deficiencies), and "Gatorade", or any sports drinks that can replenish any electrolyte imbalances. (If you are a diabetic, ask your pharmacist to get you a sugar-free brand.) Warm showers or baths may also be helpful. Stretching exercises are highly recommended. General Instructions:  Be alert for signs of possible infection: redness, swelling, heat, red streaks, elevated temperature, and/or fever. These typically appear 4 to 6 days after the procedure. Immediately notify your doctor if you experience unusual bleeding, difficulty breathing, or loss of bowel or bladder control. If you experience increased pain, do not increase your pain medicine intake, unless instructed by your pain physician. Post-Procedure Care:  Be careful in moving about. Muscle spasms in the area of the injection may occur. Applying ice or heat to the area is often helpful. The incidence of spinal headaches after epidural injections ranges between 1.4% and 6%. If you develop a headache that does not seem to respond to conservative therapy, please let your physician know. This can be treated with an epidural blood patch.   Post-procedure numbness or redness is to be expected, however it should average 4 to 6 hours. If numbness and weakness of your extremities begins to develop 4 to 6 hours after your procedure, and is felt to be progressing and worsening, immediately contact your physician.   Diet:  If you experience nausea, do not eat until this sensation goes away. If you had a "Stellate Ganglion Block" for upper extremity "Reflex Sympathetic Dystrophy", do not eat or drink until your   hoarseness goes away. In any case, always start with liquids first and if you tolerate them well, then slowly progress to more solid foods. Activity:  For the first 4 to 6 hours after the procedure, use caution in moving about as you may experience numbness and/or weakness. Use caution in  cooking, using household electrical appliances, and climbing steps. If you need to reach your Doctor call our office: [phone] Monday-Thursday 8:00 am - 4:00 PM    Fridays: Closed     In case of an emergency: In case of emergency, call 911 or go to the nearest emergency room and have the physician there call us.  Interpretation of Procedure Every nerve block has two components: a diagnostic component, and a treatment component. Unrealistic expectations are the most common causes of "perceived failure".  In a perfect world, a single nerve block should be able to completely and permanently eliminate the pain. Sadly, the world is not perfect.  Most pain management nerve blocks are performed using local anesthetics and steroids. Steroids are responsible for any long-term benefit that you may experience. Their purpose is to decrease any chronic swelling that may exist in the area. Steroids begin to work immediately after being injected. However, most patients will not experience any benefits until 5 to 10 days after the injection, when the swelling has come down to the point where they can tell a difference. Steroids will only help if there is swelling to be treated. As such, they can assist with the diagnosis. If effective, they suggest an inflammatory component to the pain, and if ineffective, they rule out inflammation as the main cause or component of the problem. If the problem is one of mechanical compression, you will get no benefit from those steroids.   In the case of local anesthetics, they have a crucial role in the diagnosis of your condition. Most will begin to work within15 to 20 minutes after injection. The duration will depend on the type used (short- vs. Long-acting). It is of outmost importance that patients keep tract of their pain, after the procedure. To assist with this matter, a "Post-procedure Pain Diary" is provided. Make sure to complete it and to bring it back to your  follow-up appointment.  As long as the patient keeps accurate, detailed records of their symptoms after every procedure, and returns to have those interpreted, every procedure will provide us with invaluable information. Even a block that does not provide the patient with any relief, will always provide us with information about the mechanism and the origin of the pain. The only time a nerve block can be considered a waste of time is when patients do not keep track of the results, or do not keep their post-procedure appointment.  Reporting the results back to your physician The Pain Score  Pain is a subjective complaint. It cannot be seen, touched, or measured. We depend entirely on the patient's report of the pain in order to assess your condition and treatment. To evaluate the pain, we use a pain scale, where "0" means "No Pain", and a "10" is "the worst possible pain that you can even imagine" (i.e. something like been eaten alive by a shark or being torn apart by a lion).   You will frequently be asked to rate your pain. Please be as accurate, remember that medical decisions will be based on your responses. Please do not rate your pain above a 10. Doing so is actually interpreted as "symptom magnification" (exaggeration), as   well as lack of understanding with regards to the scale. To put this into perspective, when you tell us that your pain is at a 10 (ten), what you are saying is that there is nothing we can do to make this pain any worse. (Carefully think about that.) Preparing for Procedure with Sedation Instructions: . Oral Intake: Do not eat or drink anything for at least 8 hours prior to your procedure. . Transportation: Public transportation is not allowed. Bring an adult driver. The driver must be physically present in our waiting room before any procedure can be started. . Physical Assistance: Bring an adult capable of physically assisting you, in the event you need help. . Blood Pressure  Medicine: Take your blood pressure medicine with a sip of water the morning of the procedure. . Insulin: Take only  of your normal insulin dose. . Preventing infections: Shower with an antibacterial soap the morning of your procedure. . Build-up your immune system: Take 1000 mg of Vitamin C with every meal (3 times a day) the day prior to your procedure. . Pregnancy: If you are pregnant, call and cancel the procedure. . Sickness: If you have a cold, fever, or any active infections, call and cancel the procedure. . Arrival: You must be in the facility at least 30 minutes prior to your scheduled procedure. . Children: Do not bring children with you. . Dress appropriately: Bring dark clothing that you would not mind if they get stained. . Valuables: Do not bring any jewelry or valuables. Procedure appointments are reserved for interventional treatments only. . No Prescription Refills. . No medication changes will be discussed during procedure appointments. . No disability issues will be discussed. .  

## 2019-08-31 NOTE — Progress Notes (Signed)
Patient's Name: Mackenzie [name]  MRN: [id number]  Referring Provider: Gillis Santa, MD  DOB: 17-Jun-1956  PCP: Idelle Crouch, MD  DOS: 08/31/2019  Note by: Gillis Santa, MD  Service setting: Ambulatory outpatient  Specialty: Interventional Pain Management  Patient type: Established  Location: [facility] (AMB) Pain Management Facility  Visit type: Interventional Procedure   Primary Reason for Visit: Interventional Pain Management Treatment. CC: Back Pain (low and bilateral worse on the right)  Procedure:          Anesthesia, Analgesia, Anxiolysis:  Type: Thermal Lumbar Facet, Medial Branch Radiofrequency Ablation/Neurotomy  #1  Primary Purpose: Therapeutic Region: Posterolateral Lumbosacral Spine Level: L3, L4, L5, & S1 Medial Branch Level(s). These levels will denervate the L3-4, L4-5, and the L5-S1 lumbar facet joints. Laterality: Right  Type: Moderate (Conscious) Sedation combined with Local Anesthesia Indication(s): Analgesia and Anxiety Route: Intravenous (IV) IV Access: Secured Sedation: Meaningful verbal contact was maintained at all times during the procedure  Local Anesthetic: Lidocaine 1-2%  Position: Prone   Indications: 1. Lumbar spondylosis    Mackenzie [name] has been dealing with the above chronic pain for longer than three months and has either failed to respond, was unable to tolerate, or simply did not get enough benefit from other more conservative therapies including, but not limited to: 1. Over-the-counter medications 2. Anti-inflammatory medications 3. Muscle relaxants 4. Membrane stabilizers 5. Opioids 6. Physical therapy and/or chiropractic manipulation 7. Modalities (Heat, ice, etc.) 8. Invasive techniques such as nerve blocks. Mackenzie [name] has attained more than 50% relief of the pain from a series of diagnostic injections conducted in separate occasions.  Pain Score: Pre-procedure: 9 /10 Post-procedure: 0-No pain/10  Pre-op Assessment:  Mackenzie [name] is a 63 y.o. (year  old), female patient, seen today for interventional treatment. 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 Mackenzie [name] has a current medication list which includes the following prescription(s): alprazolam, budesonide-formoterol, celecoxib, cholecalciferol, clobetasol ointment, cyanocobalamin, cyclobenzaprine, duloxetine, ferrous sulfate, gabapentin, hydrochlorothiazide, lovastatin, melatonin, oxycodone-acetaminophen, pantoprazole, polyethylene glycol, sucralfate, and valacyclovir, and the following Facility-Administered Medications: fentanyl. Her primarily concern today is the Back Pain (low and bilateral worse on the right)  Initial Vital Signs:  Pulse/HCG Rate: 64ECG Heart Rate: 65 Temp: (!) 97.2 F (36.2 C) Resp: 16 BP: 105/67 SpO2: 95 %  BMI: Estimated body mass index is 39.53 kg/m as calculated from the following:   Height as of this encounter: 5\' 8"  (1.727 m).   Weight as of this encounter: 260 lb (117.9 kg).  Risk Assessment: Allergies: Reviewed. She has No Known Allergies.  Allergy Precautions: None required Coagulopathies: Reviewed. None identified.  Blood-thinner therapy: None at this time Active Infection(s): Reviewed. None identified. Mackenzie [name] is afebrile  Site Confirmation: Mackenzie [name] was asked to confirm the procedure and laterality before marking the site Procedure checklist: Completed Consent: Before the procedure and under the influence of no sedative(s), amnesic(s), or anxiolytics, the patient was informed of the treatment  options, risks and possible complications. To fulfill our ethical and legal obligations, as recommended by the American Medical Association's Code of Ethics, I have informed the patient of my clinical impression; the nature and purpose of the treatment  or procedure; the risks, benefits, and possible complications of the intervention; the alternatives, including doing nothing; the risk(s) and benefit(s) of the alternative treatment(s) or procedure(s); and the risk(s) and benefit(s) of doing nothing. The patient was provided information about the general risks and possible complications associated with the procedure. These may include, but are not limited to: failure to achieve desired goals, infection, bleeding, organ or nerve damage, allergic reactions, paralysis, and death. In addition, the patient was informed of those risks and complications associated to Spine-related procedures, such as failure to decrease pain; infection (i.e.: Meningitis, epidural or intraspinal abscess); bleeding (i.e.: epidural hematoma, subarachnoid hemorrhage, or any other type of intraspinal or peri-dural bleeding); organ or nerve damage (i.e.: Any type of peripheral nerve, nerve root, or spinal cord injury) with subsequent damage to sensory, motor, and/or autonomic systems, resulting in permanent pain, numbness, and/or weakness of one or several areas of the body; allergic reactions; (i.e.: anaphylactic reaction); and/or death. Furthermore, the patient was informed of those risks and complications associated with the medications. These include, but are not limited to: allergic reactions (i.e.: anaphylactic or anaphylactoid reaction(s)); adrenal axis suppression; blood sugar elevation that in diabetics may result in ketoacidosis or comma; water retention that in patients with history of congestive heart failure may result in shortness of breath, pulmonary edema, and decompensation with resultant heart failure; weight gain; swelling or  edema; medication-induced neural toxicity; particulate matter embolism and blood vessel occlusion with resultant organ, and/or nervous system infarction; and/or aseptic necrosis of one or more joints. Finally, the patient was informed that Medicine is not an exact science; therefore, there is also the possibility of unforeseen or unpredictable risks and/or possible complications that may result in a catastrophic outcome. The patient indicated having understood very clearly. We have given the patient no guarantees and we have made no promises. Enough time was given to the patient to ask questions, all of which were answered to the patient's satisfaction. Ms. Klingberg has indicated that she wanted to continue with the procedure. Attestation: I, the ordering provider, attest that I have discussed with the patient the benefits, risks, side-effects, alternatives, likelihood of achieving goals, and potential problems during recovery for the procedure that I have provided informed consent. Date  Time: 08/31/2019  7:49 AM  Pre-Procedure Preparation:  Monitoring: As per clinic protocol. Respiration, ETCO2, SpO2, BP, heart rate and rhythm monitor placed and checked for adequate function Safety Precautions: Patient was assessed for positional comfort and pressure points before starting the procedure. Time-out: I initiated and conducted the "Time-out" before starting the procedure, as per protocol. The patient was asked to participate by confirming the accuracy of the "Time Out" information. Verification of the correct person, site, and procedure were performed and confirmed by me, the nursing staff, and the patient. "Time-out" conducted as per Joint Commission's Universal Protocol (UP.01.01.01). Time: [id number]  Description of Procedure:          Laterality: Right Levels:  L3, L4, L5, & S1 Medial Branch Level(s), at the L3-4, L4-5, and the L5-S1 lumbar facet joints. Area Prepped: Lumbosacral Prepping solution: DuraPrep  (Iodine Povacrylex [0.7% available iodine] and Isopropyl Alcohol, 74% w/w) Safety Precautions: Aspiration looking for blood return was conducted prior to all injections. At no point did we inject any substances, as a needle was being advanced. Before injecting, the patient was told to immediately notify me if she was experiencing any new onset of "ringing in the ears, or metallic taste in the mouth". No attempts were made  at seeking any paresthesias. Safe injection practices and needle disposal techniques used. Medications properly checked for expiration dates. SDV (single dose vial) medications used. After the completion of the procedure, all disposable equipment used was discarded in the proper designated medical waste containers. Local Anesthesia: Protocol guidelines were followed. The patient was positioned over the fluoroscopy table. The area was prepped in the usual manner. The time-out was completed. The target area was identified using fluoroscopy. A 12-in long, straight, sterile hemostat was used with fluoroscopic guidance to locate the targets for each level blocked. Once located, the skin was marked with an approved surgical skin marker. Once all sites were marked, the skin (epidermis, dermis, and hypodermis), as well as deeper tissues (fat, connective tissue and muscle) were infiltrated with a small amount of a short-acting local anesthetic, loaded on a 10cc syringe with a 25G, 1.5-in  Needle. An appropriate amount of time was allowed for local anesthetics to take effect before proceeding to the next step. Local Anesthetic: Lidocaine 2.0% The unused portion of the local anesthetic was discarded in the proper designated containers. Technical explanation of process:  Radiofrequency Ablation (RFA)  L3 Medial Branch Nerve RFA: The target area for the L3 medial branch is at the junction of the postero-lateral aspect of the superior articular process and the superior, posterior, and medial edge of the  transverse process of L4. Under fluoroscopic guidance, a Radiofrequency needle was inserted until contact was made with os over the superior postero-lateral aspect of the pedicular shadow (target area). Sensory and motor testing was conducted to properly adjust the position of the needle. Once satisfactory placement of the needle was achieved, the numbing solution was slowly injected after negative aspiration for blood. 1 mL of the nerve block solution was injected without difficulty or complication. After waiting for at least 3 minutes, the ablation was performed. Once completed, the needle was removed intact. L4 Medial Branch Nerve RFA: The target area for the L4 medial branch is at the junction of the postero-lateral aspect of the superior articular process and the superior, posterior, and medial edge of the transverse process of L5. Under fluoroscopic guidance, a Radiofrequency needle was inserted until contact was made with os over the superior postero-lateral aspect of the pedicular shadow (target area). Sensory and motor testing was conducted to properly adjust the position of the needle. Once satisfactory placement of the needle was achieved, the numbing solution was slowly injected after negative aspiration for blood.1 mL of the nerve block solution was injected without difficulty or complication. After waiting for at least 3 minutes, the ablation was performed. Once completed, the needle was removed intact. L5 Medial Branch Nerve RFA: The target area for the L5 medial branch is at the junction of the postero-lateral aspect of the superior articular process of S1 and the superior, posterior, and medial edge of the sacral ala. Under fluoroscopic guidance, a Radiofrequency needle was inserted until contact was made with os over the superior postero-lateral aspect of the pedicular shadow (target area). Sensory and motor testing was conducted to properly adjust the position of the needle. Once satisfactory  placement of the needle was achieved, the numbing solution was slowly injected after negative aspiration for blood.1 mL of the nerve block solution was injected without difficulty or complication. After waiting for at least 3 minutes, the ablation was performed. Once completed, the needle was removed intact. S1 Medial Branch Nerve RFA: The target area for the S1 medial branch is located inferior to the junction of  the S1 superior articular process and the L5 inferior articular process, posterior, inferior, and lateral to the 6 o'clock position of the L5-S1 facet joint, just superior to the S1 posterior foramen. Under fluoroscopic guidance, the Radiofrequency needle was advanced until contact was made with os over the Target area. Sensory and motor testing was conducted to properly adjust the position of the needle. Once satisfactory placement of the needle was achieved, the numbing solution was slowly injected after negative aspiration for blood. 46mL of the nerve block solution was injected without difficulty or complication. After waiting for at least 3 minutes, the ablation was performed. Once completed, the needle was removed intact. Radiofrequency lesioning (ablation):  Radiofrequency Generator: NeuroTherm [id number] Sensory Stimulation Parameters: 50 Hz was used to locate & identify the nerve, making sure that the needle was positioned such that there was no sensory stimulation below 0.3 V or above 0.7 V. Motor Stimulation Parameters: 2 Hz was used to evaluate the motor component. Care was taken not to lesion any nerves that demonstrated motor stimulation of the lower extremities at an output of less than 2.5 times that of the sensory threshold, or a maximum of 2.0 V. Lesioning Technique Parameters: Standard Radiofrequency settings. (Not bipolar or pulsed.) Temperature Settings: 80 degrees C Lesioning time: 60 seconds Intra-operative Compliance: Compliant Materials & Medications: Needle(s)  (Electrode/Cannula) Type: Teflon-coated, curved tip, Radiofrequency needle(s) Gauge: 22G Length: 10cm Numbing solution: 5 cc solution made of 3cc of 0.2% ropivacaine, 2 cc of Decadron 10 mg/cc.  1 cc injected at each level above after sensorimotor testing prior to lesioning.  The unused portion of the solution was discarded in the proper designated containers.  Once the entire procedure was completed, the treated area was cleaned, making sure to leave some of the prepping solution back to take advantage of its long term bactericidal properties.  Illustration of the posterior view of the lumbar spine and the posterior neural structures. Laminae of L2 through S1 are labeled. DPRL5, dorsal primary ramus of L5; DPRS1, dorsal primary ramus of S1; DPR3, dorsal primary ramus of L3; FJ, facet (zygapophyseal) joint L3-L4; I, inferior articular process of L4; LB1, lateral branch of dorsal primary ramus of L1; IAB, inferior articular branches from L3 medial branch (supplies L4-L5 facet joint); IBP, intermediate branch plexus; MB3, medial branch of dorsal primary ramus of L3; NR3, third lumbar nerve root; S, superior articular process of L5; SAB, superior articular branches from L4 (supplies L4-5 facet joint also); TP3, transverse process of L3.  Vitals:   08/31/19 0902 08/31/19 0908 08/31/19 0918 08/31/19 0928  BP: 139/80  (!) 146/82 (!) 141/92  Pulse:      Resp: 16 15 17 18   Temp:  (!) 97.1 F (36.2 C)    TempSrc:      SpO2: 97% 98% 98% 97%  Weight:      Height:        Start Time: 0838 hrs. End Time: 0902 hrs.  Imaging Guidance (Spinal):          Type of Imaging Technique: Fluoroscopy Guidance (Spinal) Indication(s): Assistance in needle guidance and placement for procedures requiring needle placement in or near specific anatomical locations not easily accessible without such assistance. Exposure Time: Please see nurses notes. Contrast: None used. Fluoroscopic Guidance: I was personally present  during the use of fluoroscopy. "Tunnel Vision Technique" used to obtain the best possible view of the target area. Parallax error corrected before commencing the procedure. "Direction-depth-direction" technique used to introduce the needle under continuous  pulsed fluoroscopy. Once target was reached, antero-posterior, oblique, and lateral fluoroscopic projection used confirm needle placement in all planes. Images permanently stored in EMR. Interpretation: No contrast injected. I personally interpreted the imaging intraoperatively. Adequate needle placement confirmed in multiple planes. Permanent images saved into the patient's record.  Antibiotic Prophylaxis:   Anti-infectives (From admission, onward)   None     Indication(s): None identified  Post-operative Assessment:  Post-procedure Vital Signs:  Pulse/HCG Rate: 6470 Temp: (!) 97.1 F (36.2 C) Resp: 18 BP: (!) 141/92 SpO2: 97 %  EBL: None  Complications: No immediate post-treatment complications observed by team, or reported by patient.  Note: The patient tolerated the entire procedure well. A repeat set of vitals were taken after the procedure and the patient was kept under observation following institutional policy, for this type of procedure. Post-procedural neurological assessment was performed, showing return to baseline, prior to discharge. The patient was provided with post-procedure discharge instructions, including a section on how to identify potential problems. Should any problems arise concerning this procedure, the patient was given instructions to immediately contact us, at any time, without hesitation. In any case, we plan to contact the patient by telephone for a follow-up status report regarding this interventional procedure.  Comments:  No additional relevant information.  Plan of Care  Orders:  Orders Placed This Encounter  Procedures  . RFA - Lumbar Facet (Schedule)    Standing Status:   Future    Standing  Expiration Date:   02/28/2021    Scheduling Instructions:     Side(s): Left-sided     Level: L3-4, L4-5, & L5-S1 Facets (L3, L4, L5, & S1 Medial Branch Nerves)     Sedation: With Sedation     Scheduling Timeframe: As soon as pre-approved    Order Specific Question:   Where will this procedure be performed?    Answer:   [facility]  . Fluoro (C-Arm) (<60 min) (No Report)    Intraoperative interpretation by procedural physician at [place].    Standing Status:   Standing    Number of Occurrences:   1    Order Specific Question:   Reason for exam:    Answer:   Assistance in needle guidance and placement for procedures requiring needle placement in or near specific anatomical locations not easily accessible without such assistance.    Medications ordered for procedure: Meds ordered this encounter  Medications  . lidocaine (XYLOCAINE) 2 % (with pres) injection 400 mg  . fentaNYL (SUBLIMAZE) injection 25-50 mcg    Make sure Narcan is available in the pyxis when using this medication. In the event of respiratory depression (RR< 8/min): Titrate NARCAN (naloxone) in increments of 0.1 to 0.2 mg IV at 2-3 minute intervals, until desired degree of reversal.  . ropivacaine (PF) 2 mg/mL (0.2%) (NAROPIN) injection 9 mL  . dexamethasone (DECADRON) injection 10 mg  . dexamethasone (DECADRON) injection 10 mg  . ropivacaine (PF) 2 mg/mL (0.2%) (NAROPIN) injection 9 mL   Medications administered: We administered lidocaine, fentaNYL, ropivacaine (PF) 2 mg/mL (0.2%), dexamethasone, dexamethasone, and ropivacaine (PF) 2 mg/mL (0.2%).  See the medical record for exact dosing, route, and time of administration.  Follow-up plan:   Return in about 2 weeks (around 09/14/2019), or LEFT L3,4,5, S1 RFA, for with sedation.      Hx of R hip replacement, s/p R L4/5 ESI #1, not effective. Trial of lumbar facet diagnostic blocks at L3,4,5 S1 b/l on 05/09/2019 and 06/13/2019,  L3,4,5,  S1 RFA on RIGHT  08/31/2019     Recent Visits Date Type Provider Dept  07/11/19 Office Visit Gillis Santa, MD [facility]  06/13/19 Procedure visit Gillis Santa, MD [facility]  06/06/19 Office Visit Gillis Santa, MD [facility]  Showing recent visits within past 90 days and meeting all other requirements   Today's Visits Date Type Provider Dept  08/31/19 Procedure visit Gillis Santa, MD [facility]  Showing today's visits and meeting all other requirements   Future Appointments No visits were found meeting these conditions.  Showing future appointments within next 90 days and meeting all other requirements   Disposition: Discharge home  Discharge Date & Time: 08/31/2019; 0928 hrs.   Primary Care Physician: Idelle Crouch, MD Location: [facility] Note by: Gillis Santa, MD Date: 08/31/2019; Time: 3:51 PM  Disclaimer:  Medicine is not an exact science. The only guarantee in medicine is that nothing is guaranteed. It is important to note that the decision to proceed with this intervention was based on the information collected from the patient. The Data and conclusions were drawn from the patient's questionnaire, the interview, and the physical examination. Because the information was provided in large part by the patient, it cannot be guaranteed that it has not been purposely or unconsciously manipulated. Every effort has been made to obtain as much relevant data as possible for this evaluation. It is important to note that the conclusions that lead to this procedure are derived in large part from the available data. Always take into account that the treatment will also be dependent on availability of resources and existing treatment guidelines, considered by other Pain Management Practitioners as being common knowledge and practice, at the time of the intervention. For Medico-Legal purposes, it is also important to point out that  variation in procedural techniques and pharmacological choices are the acceptable norm. The indications, contraindications, technique, and results of the above procedure should only be interpreted and judged by a Board-Certified Interventional Pain Specialist with extensive familiarity and expertise in the same exact procedure and technique.

## 2019-08-31 NOTE — Progress Notes (Signed)
Safety precautions to be maintained throughout the outpatient stay will include: orient to surroundings, keep bed in low position, maintain call bell within reach at all times, provide assistance with transfer out of bed and ambulation.  

## 2019-09-07 ENCOUNTER — Encounter: Payer: Self-pay | Admitting: Obstetrics and Gynecology

## 2019-09-07 ENCOUNTER — Other Ambulatory Visit: Payer: Self-pay | Admitting: Obstetrics and Gynecology

## 2019-09-07 MED ORDER — VALACYCLOVIR HCL 500 MG PO TABS: 500.0000 mg | ORAL_TABLET | Freq: Every day | ORAL | 2 refills | Status: DC

## 2019-09-07 NOTE — Progress Notes (Signed)
Rx valtrex to take daily as preventive 

## 2019-09-13 ENCOUNTER — Other Ambulatory Visit: Payer: Self-pay | Admitting: Podiatry

## 2019-09-13 ENCOUNTER — Other Ambulatory Visit: Payer: Self-pay | Admitting: Student in an Organized Health Care Education/Training Program

## 2019-09-13 DIAGNOSIS — G894 Chronic pain syndrome: Secondary | ICD-10-CM

## 2019-09-14 ENCOUNTER — Encounter: Payer: Self-pay | Admitting: Podiatry

## 2019-09-14 ENCOUNTER — Other Ambulatory Visit: Payer: Self-pay

## 2019-09-14 ENCOUNTER — Other Ambulatory Visit: Payer: Self-pay | Admitting: Obstetrics and Gynecology

## 2019-09-17 ENCOUNTER — Other Ambulatory Visit: Payer: Self-pay | Admitting: Student in an Organized Health Care Education/Training Program

## 2019-09-20 ENCOUNTER — Other Ambulatory Visit
Admission: RE | Admit: 2019-09-20 | Discharge: 2019-09-20 | Disposition: A | Discharge status: Home / Self Care | Payer: BC Managed Care – PPO | Source: Ambulatory Visit | Attending: Podiatry | Admitting: Podiatry

## 2019-09-20 ENCOUNTER — Other Ambulatory Visit: Payer: Self-pay

## 2019-09-20 DIAGNOSIS — Z01812 Encounter for preprocedural laboratory examination: Secondary | ICD-10-CM | POA: Insufficient documentation

## 2019-09-20 DIAGNOSIS — Z20822 Contact with and (suspected) exposure to covid-19: Secondary | ICD-10-CM | POA: Diagnosis not present

## 2019-09-20 LAB — SARS CORONAVIRUS 2 (TAT 6-24 HRS): SARS Coronavirus 2: NEGATIVE

## 2019-09-22 ENCOUNTER — Ambulatory Visit: Payer: BC Managed Care – PPO | Admitting: Anesthesiology

## 2019-09-22 ENCOUNTER — Encounter: Payer: Self-pay | Admitting: Podiatry

## 2019-09-22 ENCOUNTER — Encounter
Admission: RE | Disposition: A | Discharge status: Home / Self Care | Payer: Self-pay | Source: Home / Self Care | Attending: Podiatry

## 2019-09-22 ENCOUNTER — Ambulatory Visit
Admission: RE | Admit: 2019-09-22 | Discharge: 2019-09-22 | Disposition: A | Discharge status: Home / Self Care | Payer: BC Managed Care – PPO | Attending: Podiatry | Admitting: Podiatry

## 2019-09-22 DIAGNOSIS — K219 Gastro-esophageal reflux disease without esophagitis: Secondary | ICD-10-CM | POA: Diagnosis not present

## 2019-09-22 DIAGNOSIS — J449 Chronic obstructive pulmonary disease, unspecified: Secondary | ICD-10-CM | POA: Insufficient documentation

## 2019-09-22 DIAGNOSIS — Z79899 Other long term (current) drug therapy: Secondary | ICD-10-CM | POA: Diagnosis not present

## 2019-09-22 DIAGNOSIS — X58XXXA Exposure to other specified factors, initial encounter: Secondary | ICD-10-CM | POA: Diagnosis not present

## 2019-09-22 DIAGNOSIS — Z87891 Personal history of nicotine dependence: Secondary | ICD-10-CM | POA: Insufficient documentation

## 2019-09-22 DIAGNOSIS — I10 Essential (primary) hypertension: Secondary | ICD-10-CM | POA: Diagnosis not present

## 2019-09-22 DIAGNOSIS — G4730 Sleep apnea, unspecified: Secondary | ICD-10-CM | POA: Diagnosis not present

## 2019-09-22 DIAGNOSIS — Z96649 Presence of unspecified artificial hip joint: Secondary | ICD-10-CM | POA: Insufficient documentation

## 2019-09-22 DIAGNOSIS — Z7951 Long term (current) use of inhaled steroids: Secondary | ICD-10-CM | POA: Insufficient documentation

## 2019-09-22 DIAGNOSIS — S92312A Displaced fracture of first metatarsal bone, left foot, initial encounter for closed fracture: Secondary | ICD-10-CM | POA: Diagnosis not present

## 2019-09-22 HISTORY — DX: Other allergy status, other than to drugs and biological substances: Z91.09

## 2019-09-22 HISTORY — DX: Obesity, unspecified: E66.9

## 2019-09-22 HISTORY — PX: ORIF TOE FRACTURE: SHX5032

## 2019-09-22 HISTORY — DX: Diverticulosis of intestine, part unspecified, without perforation or abscess without bleeding: K57.90

## 2019-09-22 HISTORY — DX: Other symptoms and signs involving the musculoskeletal system: R29.898

## 2019-09-22 HISTORY — DX: Dyspnea, unspecified: R06.00

## 2019-09-22 SURGERY — OPEN REDUCTION INTERNAL FIXATION (ORIF) METATARSAL (TOE) FRACTURE
Anesthesia: General | Site: Toe | Laterality: Left

## 2019-09-22 MED ORDER — MIDAZOLAM HCL 5 MG/5ML IJ SOLN
INTRAMUSCULAR | Status: DC | PRN
  Administered 2019-09-22: 2 mg via INTRAVENOUS

## 2019-09-22 MED ORDER — OXYCODONE HCL 5 MG/5ML PO SOLN: 5.0000 mg | Freq: Once | ORAL | Status: AC | PRN

## 2019-09-22 MED ORDER — POVIDONE-IODINE 7.5 % EX SOLN: Freq: Once | CUTANEOUS | Status: AC

## 2019-09-22 MED ORDER — BUPIVACAINE HCL (PF) 0.25 % IJ SOLN
INTRAMUSCULAR | Status: DC | PRN
  Administered 2019-09-22: 5 mL

## 2019-09-22 MED ORDER — FENTANYL CITRATE (PF) 100 MCG/2ML IJ SOLN
25.0000 ug | INTRAMUSCULAR | Status: DC | PRN
  Administered 2019-09-22: 10:00:00 25 ug via INTRAVENOUS
  Administered 2019-09-22: 10:00:00 50 ug via INTRAVENOUS
  Administered 2019-09-22: 10:00:00 25 ug via INTRAVENOUS

## 2019-09-22 MED ORDER — LACTATED RINGERS IV SOLN: 10.0000 mL/h | INTRAVENOUS | Status: DC

## 2019-09-22 MED ORDER — ACETAMINOPHEN 325 MG PO TABS
325.0000 mg | ORAL_TABLET | ORAL | Status: DC | PRN
  Administered 2019-09-22: 325 mg via ORAL

## 2019-09-22 MED ORDER — BUPIVACAINE LIPOSOME 1.3 % IJ SUSP
INTRAMUSCULAR | Status: DC | PRN
  Administered 2019-09-22: 5 mL
  Administered 2019-09-22: 10 mL

## 2019-09-22 MED ORDER — ONDANSETRON HCL 4 MG/2ML IJ SOLN: 4.0000 mg | Freq: Once | INTRAMUSCULAR | Status: DC | PRN

## 2019-09-22 MED ORDER — ACETAMINOPHEN 160 MG/5ML PO SOLN: 325.0000 mg | ORAL | Status: DC | PRN

## 2019-09-22 MED ORDER — OXYCODONE HCL 5 MG PO TABS
5.0000 mg | ORAL_TABLET | Freq: Once | ORAL | Status: AC
  Administered 2019-09-22: 11:00:00 5 mg via ORAL

## 2019-09-22 MED ORDER — PROPOFOL 10 MG/ML IV BOLUS
INTRAVENOUS | Status: DC | PRN
  Administered 2019-09-22: 150 mg via INTRAVENOUS

## 2019-09-22 MED ORDER — GLYCOPYRROLATE 0.2 MG/ML IJ SOLN
INTRAMUSCULAR | Status: DC | PRN
  Administered 2019-09-22: .1 mg via INTRAVENOUS

## 2019-09-22 MED ORDER — SODIUM CHLORIDE 0.9 % IV SOLN
800.0000 mg | Freq: Once | INTRAVENOUS | Status: AC
  Administered 2019-09-22: 11:00:00 800 mg via INTRAVENOUS

## 2019-09-22 MED ORDER — CEFAZOLIN SODIUM-DEXTROSE 2-4 GM/100ML-% IV SOLN
2.0000 g | INTRAVENOUS | Status: AC
  Administered 2019-09-22: 08:00:00 2 g via INTRAVENOUS

## 2019-09-22 MED ORDER — ENOXAPARIN SODIUM 40 MG/0.4ML ~~LOC~~ SOLN: 40.0000 mg | SUBCUTANEOUS | 0 refills | Status: DC

## 2019-09-22 MED ORDER — BUPIVACAINE HCL (PF) 0.25 % IJ SOLN
INTRAMUSCULAR | Status: DC | PRN
  Administered 2019-09-22: 15 mL

## 2019-09-22 MED ORDER — LACTATED RINGERS IV SOLN: INTRAVENOUS | Status: DC

## 2019-09-22 MED ORDER — FENTANYL CITRATE (PF) 100 MCG/2ML IJ SOLN
INTRAMUSCULAR | Status: DC | PRN
  Administered 2019-09-22: 50 ug via INTRAVENOUS
  Administered 2019-09-22 (×2): 25 ug via INTRAVENOUS

## 2019-09-22 MED ORDER — ONDANSETRON HCL 4 MG/2ML IJ SOLN
INTRAMUSCULAR | Status: DC | PRN
  Administered 2019-09-22: 4 mg via INTRAVENOUS

## 2019-09-22 MED ORDER — OXYCODONE HCL 5 MG PO TABS
5.0000 mg | ORAL_TABLET | Freq: Once | ORAL | Status: AC | PRN
  Administered 2019-09-22: 10:00:00 5 mg via ORAL

## 2019-09-22 MED ORDER — DEXAMETHASONE SODIUM PHOSPHATE 4 MG/ML IJ SOLN
INTRAMUSCULAR | Status: DC | PRN
  Administered 2019-09-22: 4 mg via INTRAVENOUS

## 2019-09-22 MED ORDER — OXYCODONE-ACETAMINOPHEN 7.5-325 MG PO TABS: 1.0000 | ORAL_TABLET | ORAL | 0 refills | Status: DC | PRN

## 2019-09-22 SURGICAL SUPPLY — 52 items
APL SKNCLS STERI-STRIP NONHPOA (GAUZE/BANDAGES/DRESSINGS) ×1
BENZOIN TINCTURE PRP APPL 2/3 (GAUZE/BANDAGES/DRESSINGS) ×3 IMPLANT
BIT DRILL 1.7 LNG CANN (DRILL) ×2 IMPLANT
BLADE OSC/SAGITTAL MD 9X18.5 (BLADE) ×2 IMPLANT
BNDG CMPR 75X41 PLY HI ABS (GAUZE/BANDAGES/DRESSINGS) ×1
BNDG CMPR STD VLCR NS LF 5.8X4 (GAUZE/BANDAGES/DRESSINGS) ×2
BNDG ELASTIC 4X5.8 VLCR NS LF (GAUZE/BANDAGES/DRESSINGS) ×4 IMPLANT
BNDG ESMARK 4X12 TAN STRL LF (GAUZE/BANDAGES/DRESSINGS) ×3 IMPLANT
BNDG GAUZE 4.5X4.1 6PLY STRL (MISCELLANEOUS) ×3 IMPLANT
BNDG STRETCH 4X75 STRL LF (GAUZE/BANDAGES/DRESSINGS) ×3 IMPLANT
CANISTER SUCT 1200ML W/VALVE (MISCELLANEOUS) ×3 IMPLANT
CLOSURE WOUND 1/4X4 (GAUZE/BANDAGES/DRESSINGS) ×1
COUNTERSINK HEADED 2.5 (ORTHOPEDIC DISPOSABLE SUPPLIES) ×3
COVER LIGHT HANDLE UNIVERSAL (MISCELLANEOUS) ×6 IMPLANT
CUFF TOURN SGL QUICK 18X4 (TOURNIQUET CUFF) ×3 IMPLANT
DRAPE FLUOR MINI C-ARM 54X84 (DRAPES) ×3 IMPLANT
DURAPREP 26ML APPLICATOR (WOUND CARE) ×3 IMPLANT
ELECT REM PT RETURN 9FT ADLT (ELECTROSURGICAL) ×3
ELECTRODE REM PT RTRN 9FT ADLT (ELECTROSURGICAL) ×1 IMPLANT
GAUZE SPONGE 4X4 12PLY STRL (GAUZE/BANDAGES/DRESSINGS) ×3 IMPLANT
GAUZE XEROFORM 1X8 LF (GAUZE/BANDAGES/DRESSINGS) ×3 IMPLANT
GLOVE BIO SURGEON STRL SZ8 (GLOVE) ×7 IMPLANT
GOWN STRL REUS W/ TWL LRG LVL3 (GOWN DISPOSABLE) ×1 IMPLANT
GOWN STRL REUS W/ TWL XL LVL3 (GOWN DISPOSABLE) ×1 IMPLANT
GOWN STRL REUS W/TWL LRG LVL3 (GOWN DISPOSABLE) ×3
GOWN STRL REUS W/TWL XL LVL3 (GOWN DISPOSABLE) ×3
K-WIRE DBL END TROCAR 6X.062 (WIRE) ×3
KIT TURNOVER KIT A (KITS) ×3 IMPLANT
KWIRE DBL END TROCAR 6X.062 (WIRE) IMPLANT
Microaire K-Wire ×2 IMPLANT
NDL HYPO 25GX1X1/2 BEV (NEEDLE) IMPLANT
NEEDLE HYPO 25GX1X1/2 BEV (NEEDLE) ×3 IMPLANT
NS IRRIG 500ML POUR BTL (IV SOLUTION) ×3 IMPLANT
PACK EXTREMITY ARMC (MISCELLANEOUS) ×3 IMPLANT
PADDING CAST BLEND 4X4 NS (MISCELLANEOUS) ×12 IMPLANT
PENCIL SMOKE EVACUATOR (MISCELLANEOUS) ×2 IMPLANT
SCREW CANN HDLS LNG 2.5X32 (Screw) ×2 IMPLANT
SCREW COUNTERSINK HEADED 2.5 (ORTHOPEDIC DISPOSABLE SUPPLIES) IMPLANT
SCREW HEADED 2.5X32 (Screw) ×2 IMPLANT
SPLINT CAST 1 STEP 4X30 (MISCELLANEOUS) ×3 IMPLANT
SPLINT CAST ROLL 3IN (MISCELLANEOUS) ×4 IMPLANT
SPLINT CAST ROLL 4X15 WHT (MISCELLANEOUS) ×2 IMPLANT
SPLINT FAST PLASTER 5X30 (CAST SUPPLIES) ×2
SPLINT PLASTER CAST FAST 5X30 (CAST SUPPLIES) ×1 IMPLANT
STOCKINETTE STRL 6IN 960660 (GAUZE/BANDAGES/DRESSINGS) ×3 IMPLANT
STRIP CLOSURE SKIN 1/4X4 (GAUZE/BANDAGES/DRESSINGS) ×2 IMPLANT
SUT ETHILON 4-0 (SUTURE) ×6
SUT ETHILON 4-0 FS2 18XMFL BLK (SUTURE) ×2
SUT VIC AB 4-0 FS2 27 (SUTURE) ×3 IMPLANT
SUTURE ETHLN 4-0 FS2 18XMF BLK (SUTURE) IMPLANT
SYR 10ML LL (SYRINGE) ×2 IMPLANT
WIRE SMOOTH TROCAR .9MMX150MML (WIRE) ×2 IMPLANT

## 2019-09-22 NOTE — H&P (Signed)
H and P has been reviewed and no changes are noted.  

## 2019-09-22 NOTE — Anesthesia Postprocedure Evaluation (Signed)
Anesthesia Post Note  Patient: Mackenzie [name]  Procedure(s) Performed: REMOVAL OF SCREWS WITH OPEN REDUCTION INTERNAL FIXATION (ORIF) METATARSAL (GREAT TOE) (Left Toe)     Patient location during evaluation: PACU Anesthesia Type: General Level of consciousness: awake and alert and oriented Pain management: satisfactory to patient Vital Signs Assessment: post-procedure vital signs reviewed and stable Respiratory status: spontaneous breathing, nonlabored ventilation and respiratory function stable Cardiovascular status: blood pressure returned to baseline and stable Postop Assessment: Adequate PO intake and No signs of nausea or vomiting Anesthetic complications: no    Raliegh Ip

## 2019-09-22 NOTE — Transfer of Care (Signed)
Immediate Anesthesia Transfer of Care Note  Patient: Mackenzie [name]  Procedure(s) Performed: REMOVAL OF SCREWS WITH OPEN REDUCTION INTERNAL FIXATION (ORIF) METATARSAL (GREAT TOE) (Left Toe)  Patient Location: PACU  Anesthesia Type: General LMA  Level of Consciousness: awake, alert  and patient cooperative  Airway and Oxygen Therapy: Patient Spontanous Breathing and Patient connected to supplemental oxygen  Post-op Assessment: Post-op Vital signs reviewed, Patient's Cardiovascular Status Stable, Respiratory Function Stable, Patent Airway and No signs of Nausea or vomiting  Post-op Vital Signs: Reviewed and stable  Complications: No apparent anesthesia complications

## 2019-09-22 NOTE — Anesthesia Procedure Notes (Signed)
Procedure Name: LMA Insertion Date/Time: 09/22/2019 7:46 AM Performed by: Cameron Ali, CRNA Pre-anesthesia Checklist: Patient identified, Emergency Drugs available, Suction available, Timeout performed and Patient being monitored Patient Re-evaluated:Patient Re-evaluated prior to induction Oxygen Delivery Method: Circle system utilized Preoxygenation: Pre-oxygenation with 100% oxygen Induction Type: IV induction LMA: LMA inserted LMA Size: 4.0 Number of attempts: 1 Placement Confirmation: positive ETCO2 and breath sounds checked- equal and bilateral Tube secured with: Tape Dental Injury: Teeth and Oropharynx as per pre-operative assessment

## 2019-09-22 NOTE — Discharge Instructions (Signed)
[place] DR. Anjalina Bergevin, DR. Vickki Muff, AND DR. Leland   1. Take your medication as prescribed.  Pain medication should be taken only as needed.  2. Keep the dressing clean, dry and intact.  3. Keep your foot elevated above the heart level for the first 48 hours.  4. Please stay completely nonweightbearing   5. Do not take a shower. Baths are permissible as long as the foot is kept out of the water.   6. Every hour you are awake:  - Bend your knee 15 times. - Flex foot 15 times - Massage calf 15 times  7. Call [facility] [phone]) if any of the following problems occur: - You develop a temperature or fever. - The bandage becomes saturated with blood. - Medication does not stop your pain. - Injury of the foot occurs. - Any symptoms of infection including redness, odor, or red streaks running from wound.   General Anesthesia, Adult, Care After This sheet gives you information about how to care for yourself after your procedure. Your health care provider may also give you more specific instructions. If you have problems or questions, contact your health care provider. What can I expect after the procedure? After the procedure, the following side effects are common:  Pain or discomfort at the IV site.  Nausea.  Vomiting.  Sore throat.  Trouble concentrating.  Feeling cold or chills.  Weak or tired.  Sleepiness and fatigue.  Soreness and body aches. These side effects can affect parts of the body that were not involved in surgery. Follow these instructions at home:  For at least 24 hours after the procedure:  Have a responsible adult stay with you. It is important to have someone help care for you until you are awake and alert.  Rest as needed.  Do not: ? Participate in activities in which you could fall or become injured. ? Drive. ? Use  heavy machinery. ? Drink alcohol. ? Take sleeping pills or medicines that cause drowsiness. ? Make important decisions or sign legal documents. ? Take care of children on your own. Eating and drinking  Follow any instructions from your health care provider about eating or drinking restrictions.  When you feel hungry, start by eating small amounts of foods that are soft and easy to digest (bland), such as toast. Gradually return to your regular diet.  Drink enough fluid to keep your urine pale yellow.  If you vomit, rehydrate by drinking water, juice, or clear broth. General instructions  If you have sleep apnea, surgery and certain medicines can increase your risk for breathing problems. Follow instructions from your health care provider about wearing your sleep device: ? Anytime you are sleeping, including during daytime naps. ? While taking prescription pain medicines, sleeping medicines, or medicines that make you drowsy.  Return to your normal activities as told by your health care provider. Ask your health care provider what activities are safe for you.  Take over-the-counter and prescription medicines only as told by your health care provider.  If you smoke, do not smoke without supervision.  Keep all follow-up visits as told by your health care provider. This is important. Contact a health care provider if:  You have nausea or vomiting that does not get better with medicine.  You cannot eat or drink without vomiting.  You have pain that does not get better with medicine.  You are unable to  pass urine.  You develop a skin rash.  You have a fever.  You have redness around your IV site that gets worse. Get help right away if:  You have difficulty breathing.  You have chest pain.  You have blood in your urine or stool, or you vomit blood. Summary  After the procedure, it is common to have a sore throat or nausea. It is also common to feel tired.  Have a  responsible adult stay with you for the first 24 hours after general anesthesia. It is important to have someone help care for you until you are awake and alert.  When you feel hungry, start by eating small amounts of foods that are soft and easy to digest (bland), such as toast. Gradually return to your regular diet.  Drink enough fluid to keep your urine pale yellow.  Return to your normal activities as told by your health care provider. Ask your health care provider what activities are safe for you. This information is not intended to replace advice given to you by your health care provider. Make sure you discuss any questions you have with your health care provider. Document Revised: 08/28/2017 Document Reviewed: 04/10/2017 Elsevier Patient Education  [place].

## 2019-09-22 NOTE — Op Note (Signed)
Operative note   Surgeon: Dr. Albertine Patricia, DPM.    Assistant: None    Preop diagnosis: Fractured first metatarsal secondary to osteotomy and fixation with displacement of the metatarsal head left foot    Postop diagnosis: Same    Procedure:   1.  Open reduction internal fixation and repositioning of the first metatarsal head left foot           EBL: Less than 5 cc    Anesthesia:general delivered by the anesthesia team I injected preoperatively with 10 cc of half-and-half 0.25% Marcaine and Exparel.  Postoperatively I injected 10 cc of Exparel she could not do it last week because of the family the base of the first metatarsal and the operative site.    Hemostasis: Ankle tourniquet 225 mils mercury pressure for 9 minutes    Specimen: None    Complications: None    Operative indications: Patient underwent a hallux valgus correction with first metatarsal osteotomy approximately 5 weeks ago.  Gradually the metatarsal head displaced further and further medially and proximally 2 weeks ago we decided to correct the problem.  She could not do it last week due to family obligations so we did it this week.    Procedure:  Patient was brought into the OR and placed on the operating table in thesupine position. After anesthesia was obtained the left lower extremity was prepped and draped in usual sterile fashion.  Operative Report: This time attention directed the old scar line over the first metatarsal this was followed and deepened and a longitudinal incision utilizing sharp blunt dissection.  The extensor tendon was then divided retracted laterally and incision was made through the capsular tissue down to bone.  This was freed away from the metatarsal head.  This time was noted that there was some significant healing across the osteotomy but there is still little movement across the region also.  Subsequently I had to use a combination of an osteotome a periosteal elevator as well as a  sagittal saw in order to open up and free up the original cuts.  Once I was able to accomplish this I had to use a laminar spreader in the interspace to decrease the first metatarsal medially so that I could move the metatarsal head laterally.  I fixated this temporarily check for FluoroScan and noted there is good position and correction.  At this point used a 0.062 K wire to across the osteotomy for stabilization.  Left that intact while also ran a 32 mm headless screw across the first metatarsal head running from dorsal distal to plantar proximal into the shaft.  This was seen to offer some compression and stabilization along with the K wire which is able to bend cut and buried up against the metatarsal itself.  There is checked FluoroScan excellent repositioning and correction to the area was noted good alignment of the first metatarsal phalangeal joint was noted.  After copious irrigation the capsular tissue was then closed with 4-0 Vicryl in continuous stitch as were deep superficial fascial layers.  Skin was closed with 4-0 nylon horizontal mattress sutures.  A sterile compressive dressing was then placed across wound consisting of Xeroform gauze 4 x 4's conformer and Kerlix and a posterior splint with both fiberglass and plaster components was placed on the left foot leg in the operating room.  She is to remain completely nonweightbearing.    Patient tolerated the procedure and anesthesia well.  Was transported from the OR to the PACU with  all vital signs stable and vascular status intact. To be discharged per routine protocol.  Will follow up in approximately 1 week in the outpatient clinic.

## 2019-09-22 NOTE — Addendum Note (Signed)
Addendum  created 09/22/19 1035 by Ronelle Nigh, MD   Order list changed

## 2019-09-22 NOTE — Anesthesia Preprocedure Evaluation (Signed)
Anesthesia Evaluation  Patient identified by MRN, date of birth, ID band Patient awake    Reviewed: Allergy & Precautions, H&P , NPO status , Patient's Chart, lab work & pertinent test results  Airway Mallampati: III  TM Distance: >3 FB Neck ROM: full    Dental no notable dental hx.    Pulmonary shortness of breath, sleep apnea , COPD,  COPD inhaler, former smoker,    Pulmonary exam normal breath sounds clear to auscultation       Cardiovascular hypertension, Normal cardiovascular exam Rhythm:regular Rate:Normal     Neuro/Psych PSYCHIATRIC DISORDERS  Neuromuscular disease    GI/Hepatic GERD  ,  Endo/Other    Renal/GU      Musculoskeletal   Abdominal   Peds  Hematology   Anesthesia Other Findings   Reproductive/Obstetrics                             Anesthesia Physical Anesthesia Plan  ASA: III  Anesthesia Plan: General LMA   Post-op Pain Management:    Induction:   PONV Risk Score and Plan: 3 and Ondansetron, Dexamethasone, Midazolam and Treatment may vary due to age or medical condition  Airway Management Planned:   Additional Equipment:   Intra-op Plan:   Post-operative Plan:   Informed Consent: I have reviewed the patients History and Physical, chart, labs and discussed the procedure including the risks, benefits and alternatives for the proposed anesthesia with the patient or authorized representative who has indicated his/her understanding and acceptance.       Plan Discussed with: CRNA  Anesthesia Plan Comments:         Anesthesia Quick Evaluation

## 2019-09-23 ENCOUNTER — Encounter: Payer: Self-pay | Admitting: *Deleted

## 2019-10-05 ENCOUNTER — Encounter: Payer: Self-pay | Admitting: Student in an Organized Health Care Education/Training Program

## 2019-10-05 ENCOUNTER — Other Ambulatory Visit: Payer: Self-pay

## 2019-10-05 ENCOUNTER — Ambulatory Visit (HOSPITAL_BASED_OUTPATIENT_CLINIC_OR_DEPARTMENT_OTHER): Payer: BC Managed Care – PPO | Admitting: Student in an Organized Health Care Education/Training Program

## 2019-10-05 ENCOUNTER — Ambulatory Visit
Admission: RE | Admit: 2019-10-05 | Discharge: 2019-10-05 | Disposition: A | Discharge status: Home / Self Care | Payer: BC Managed Care – PPO | Source: Ambulatory Visit | Attending: Student in an Organized Health Care Education/Training Program | Admitting: Student in an Organized Health Care Education/Training Program

## 2019-10-05 DIAGNOSIS — M47816 Spondylosis without myelopathy or radiculopathy, lumbar region: Secondary | ICD-10-CM

## 2019-10-05 MED ORDER — LIDOCAINE HCL 2 % IJ SOLN
INTRAMUSCULAR | Status: AC
  Filled 2019-10-05: qty 20

## 2019-10-05 MED ORDER — ROPIVACAINE HCL 2 MG/ML IJ SOLN
INTRAMUSCULAR | Status: AC
  Filled 2019-10-05: qty 10

## 2019-10-05 MED ORDER — ROPIVACAINE HCL 2 MG/ML IJ SOLN
9.0000 mL | Freq: Once | INTRAMUSCULAR | Status: AC
  Administered 2019-10-05: 08:00:00 10 mL via PERINEURAL

## 2019-10-05 MED ORDER — FENTANYL CITRATE (PF) 100 MCG/2ML IJ SOLN
25.0000 ug | INTRAMUSCULAR | Status: DC | PRN
  Administered 2019-10-05: 09:00:00 75 ug via INTRAVENOUS

## 2019-10-05 MED ORDER — DEXAMETHASONE SODIUM PHOSPHATE 10 MG/ML IJ SOLN
INTRAMUSCULAR | Status: AC
  Filled 2019-10-05: qty 1

## 2019-10-05 MED ORDER — LIDOCAINE HCL 2 % IJ SOLN
20.0000 mL | Freq: Once | INTRAMUSCULAR | Status: AC
  Administered 2019-10-05: 08:00:00 400 mg

## 2019-10-05 MED ORDER — DEXAMETHASONE SODIUM PHOSPHATE 10 MG/ML IJ SOLN
20.0000 mg | Freq: Once | INTRAMUSCULAR | Status: AC
  Administered 2019-10-05: 08:00:00 10 mg
  Filled 2019-10-05: qty 2

## 2019-10-05 MED ORDER — DEXAMETHASONE SODIUM PHOSPHATE 10 MG/ML IJ SOLN
10.0000 mg | Freq: Once | INTRAMUSCULAR | Status: AC
  Administered 2019-10-05: 10 mg

## 2019-10-05 MED ORDER — FENTANYL CITRATE (PF) 100 MCG/2ML IJ SOLN
INTRAMUSCULAR | Status: AC
  Filled 2019-10-05: qty 2

## 2019-10-05 NOTE — Progress Notes (Signed)
Safety precautions to be maintained throughout the outpatient stay will include: orient to surroundings, keep bed in low position, maintain call bell within reach at all times, provide assistance with transfer out of bed and ambulation.  

## 2019-10-05 NOTE — Patient Instructions (Signed)

## 2019-10-05 NOTE — Progress Notes (Signed)
Patient's Name: Mackenzie [name]  MRN: [id number]  Referring Provider: Gillis Santa, MD  DOB: 01/07/1956  PCP: Idelle Crouch, MD  DOS: 10/05/2019  Note by: Gillis Santa, MD  Service setting: Ambulatory outpatient  Specialty: Interventional Pain Management  Patient type: Established  Location: [facility] (AMB) Pain Management Facility  Visit type: Interventional Procedure   Primary Reason for Visit: Interventional Pain Management Treatment. CC: Back Pain (low and > on the right) and Groin Pain (right)  Procedure:          Anesthesia, Analgesia, Anxiolysis:  Type: Thermal Lumbar Facet, Medial Branch Radiofrequency Ablation/Neurotomy  #1  Primary Purpose: Therapeutic Region: Posterolateral Lumbosacral Spine Level: L3, L4, L5, & S1 Medial Branch Level(s). These levels will denervate the L3-4, L4-5, and the L5-S1 lumbar facet joints. Laterality: Left  Type: Moderate (Conscious) Sedation combined with Local Anesthesia Indication(s): Analgesia and Anxiety Route: Intravenous (IV) IV Access: Secured Sedation: Meaningful verbal contact was maintained at all times during the procedure  Local Anesthetic: Lidocaine 1-2%  Position: Prone   Indications: 1. Lumbar spondylosis    Mackenzie [name] has been dealing with the above chronic pain for longer than three months and has either failed to respond, was unable to tolerate, or simply did not get enough benefit from other more conservative therapies including, but not limited to: 1. Over-the-counter medications 2. Anti-inflammatory medications 3. Muscle relaxants 4. Membrane stabilizers 5. Opioids 6. Physical therapy and/or chiropractic manipulation 7. Modalities (Heat, ice, etc.) 8. Invasive techniques such as nerve blocks. Mackenzie [name] has attained more than 50% relief of the pain from a series of diagnostic injections conducted in separate occasions.  Pain Score: Pre-procedure: 3 /10 Post-procedure: 0-No pain/10  Pre-op Assessment:  Mackenzie [name] is a 64  y.o. (year old), female patient, seen today for interventional treatment. 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 Mackenzie [name] has a current medication list which includes the following prescription(s): alprazolam, amphetamine-dextroamphetamine, budesonide-formoterol, celecoxib, cholecalciferol, clobetasol ointment, cyanocobalamin, cyclobenzaprine, duloxetine, enoxaparin, ferrous sulfate, gabapentin, hydrochlorothiazide, lovastatin, melatonin, oxycodone-acetaminophen, pantoprazole, polyethylene glycol, sucralfate, and valacyclovir, and the following Facility-Administered Medications: fentanyl. Her primarily concern today is the Back Pain (low and > on the right) and Groin Pain (right)  Initial Vital Signs:  Pulse/HCG Rate: 76ECG Heart Rate: 61 Temp: 98.5 F (36.9 C) Resp: 18 BP: 126/69 SpO2: 95 %  BMI: Estimated body mass index is 39.53 kg/m as calculated from the following:   Height as of this encounter: 5\' 8"  (1.727 m).   Weight as of this encounter: 260 lb (117.9 kg).  Risk Assessment: Allergies: Reviewed. She has No Known Allergies.  Allergy Precautions: None required Coagulopathies: Reviewed. None identified.  Blood-thinner therapy: None at this time Active Infection(s): Reviewed. None identified. Mackenzie [name] is afebrile  Site Confirmation: Mackenzie [name] was asked to confirm the procedure and laterality before marking the site Procedure checklist: Completed Consent: Before the  procedure and under the influence of no sedative(s), amnesic(s), or anxiolytics, the patient was informed of the treatment options, risks and possible complications. To fulfill our ethical and legal obligations, as recommended by the American Medical Association's Code of Ethics, I  have informed the patient of my clinical impression; the nature and purpose of the treatment or procedure; the risks, benefits, and possible complications of the intervention; the alternatives, including doing nothing; the risk(s) and benefit(s) of the alternative treatment(s) or procedure(s); and the risk(s) and benefit(s) of doing nothing. The patient was provided information about the general risks and possible complications associated with the procedure. These may include, but are not limited to: failure to achieve desired goals, infection, bleeding, organ or nerve damage, allergic reactions, paralysis, and death. In addition, the patient was informed of those risks and complications associated to Spine-related procedures, such as failure to decrease pain; infection (i.e.: Meningitis, epidural or intraspinal abscess); bleeding (i.e.: epidural hematoma, subarachnoid hemorrhage, or any other type of intraspinal or peri-dural bleeding); organ or nerve damage (i.e.: Any type of peripheral nerve, nerve root, or spinal cord injury) with subsequent damage to sensory, motor, and/or autonomic systems, resulting in permanent pain, numbness, and/or weakness of one or several areas of the body; allergic reactions; (i.e.: anaphylactic reaction); and/or death. Furthermore, the patient was informed of those risks and complications associated with the medications. These include, but are not limited to: allergic reactions (i.e.: anaphylactic or anaphylactoid reaction(s)); adrenal axis suppression; blood sugar elevation that in diabetics may result in ketoacidosis or comma; water retention that in patients with history of congestive heart failure  may result in shortness of breath, pulmonary edema, and decompensation with resultant heart failure; weight gain; swelling or edema; medication-induced neural toxicity; particulate matter embolism and blood vessel occlusion with resultant organ, and/or nervous system infarction; and/or aseptic necrosis of one or more joints. Finally, the patient was informed that Medicine is not an exact science; therefore, there is also the possibility of unforeseen or unpredictable risks and/or possible complications that may result in a catastrophic outcome. The patient indicated having understood very clearly. We have given the patient no guarantees and we have made no promises. Enough time was given to the patient to ask questions, all of which were answered to the patient's satisfaction. Ms. Vadeboncoeur has indicated that she wanted to continue with the procedure. Attestation: I, the ordering provider, attest that I have discussed with the patient the benefits, risks, side-effects, alternatives, likelihood of achieving goals, and potential problems during recovery for the procedure that I have provided informed consent. Date  Time: 10/05/2019  7:45 AM  Pre-Procedure Preparation:  Monitoring: As per clinic protocol. Respiration, ETCO2, SpO2, BP, heart rate and rhythm monitor placed and checked for adequate function Safety Precautions: Patient was assessed for positional comfort and pressure points before starting the procedure. Time-out: I initiated and conducted the "Time-out" before starting the procedure, as per protocol. The patient was asked to participate by confirming the accuracy of the "Time Out" information. Verification of the correct person, site, and procedure were performed and confirmed by me, the nursing staff, and the patient. "Time-out" conducted as per Joint Commission's Universal Protocol (UP.01.01.01). Time: [id number]  Description of Procedure:          Laterality: Left Levels:  L3, L4, L5, & S1 Medial  Branch Level(s), at the L3-4, L4-5, and the L5-S1 lumbar facet joints. Area Prepped: Lumbosacral Prepping solution: DuraPrep (Iodine Povacrylex [0.7% available iodine] and Isopropyl Alcohol, 74% w/w) Safety Precautions: Aspiration looking for blood return was conducted prior to all injections. At no point did we inject any substances, as a needle was being advanced. Before injecting, the patient was told to immediately notify me if she was experiencing any new onset  of "ringing in the ears, or metallic taste in the mouth". No attempts were made at seeking any paresthesias. Safe injection practices and needle disposal techniques used. Medications properly checked for expiration dates. SDV (single dose vial) medications used. After the completion of the procedure, all disposable equipment used was discarded in the proper designated medical waste containers. Local Anesthesia: Protocol guidelines were followed. The patient was positioned over the fluoroscopy table. The area was prepped in the usual manner. The time-out was completed. The target area was identified using fluoroscopy. A 12-in long, straight, sterile hemostat was used with fluoroscopic guidance to locate the targets for each level blocked. Once located, the skin was marked with an approved surgical skin marker. Once all sites were marked, the skin (epidermis, dermis, and hypodermis), as well as deeper tissues (fat, connective tissue and muscle) were infiltrated with a small amount of a short-acting local anesthetic, loaded on a 10cc syringe with a 25G, 1.5-in  Needle. An appropriate amount of time was allowed for local anesthetics to take effect before proceeding to the next step. Local Anesthetic: Lidocaine 2.0% The unused portion of the local anesthetic was discarded in the proper designated containers. Technical explanation of process:  Radiofrequency Ablation (RFA)  L3 Medial Branch Nerve RFA: The target area for the L3 medial branch is at the  junction of the postero-lateral aspect of the superior articular process and the superior, posterior, and medial edge of the transverse process of L4. Under fluoroscopic guidance, a Radiofrequency needle was inserted until contact was made with os over the superior postero-lateral aspect of the pedicular shadow (target area). Sensory and motor testing was conducted to properly adjust the position of the needle. Once satisfactory placement of the needle was achieved, the numbing solution was slowly injected after negative aspiration for blood. 1 mL of the nerve block solution was injected without difficulty or complication. After waiting for at least 3 minutes, the ablation was performed. Once completed, the needle was removed intact. L4 Medial Branch Nerve RFA: The target area for the L4 medial branch is at the junction of the postero-lateral aspect of the superior articular process and the superior, posterior, and medial edge of the transverse process of L5. Under fluoroscopic guidance, a Radiofrequency needle was inserted until contact was made with os over the superior postero-lateral aspect of the pedicular shadow (target area). Sensory and motor testing was conducted to properly adjust the position of the needle. Once satisfactory placement of the needle was achieved, the numbing solution was slowly injected after negative aspiration for blood.1 mL of the nerve block solution was injected without difficulty or complication. After waiting for at least 3 minutes, the ablation was performed. Once completed, the needle was removed intact. L5 Medial Branch Nerve RFA: The target area for the L5 medial branch is at the junction of the postero-lateral aspect of the superior articular process of S1 and the superior, posterior, and medial edge of the sacral ala. Under fluoroscopic guidance, a Radiofrequency needle was inserted until contact was made with os over the superior postero-lateral aspect of the pedicular  shadow (target area). Sensory and motor testing was conducted to properly adjust the position of the needle. Once satisfactory placement of the needle was achieved, the numbing solution was slowly injected after negative aspiration for blood.1 mL of the nerve block solution was injected without difficulty or complication. After waiting for at least 3 minutes, the ablation was performed. Once completed, the needle was removed intact. S1 Medial Branch Nerve RFA:  The target area for the S1 medial branch is located inferior to the junction of the S1 superior articular process and the L5 inferior articular process, posterior, inferior, and lateral to the 6 o'clock position of the L5-S1 facet joint, just superior to the S1 posterior foramen. Under fluoroscopic guidance, the Radiofrequency needle was advanced until contact was made with os over the Target area. Sensory and motor testing was conducted to properly adjust the position of the needle. Once satisfactory placement of the needle was achieved, the numbing solution was slowly injected after negative aspiration for blood. 81mL of the nerve block solution was injected without difficulty or complication. After waiting for at least 3 minutes, the ablation was performed. Once completed, the needle was removed intact. Radiofrequency lesioning (ablation):  Radiofrequency Generator: NeuroTherm [id number] Sensory Stimulation Parameters: 50 Hz was used to locate & identify the nerve, making sure that the needle was positioned such that there was no sensory stimulation below 0.3 V or above 0.7 V. Motor Stimulation Parameters: 2 Hz was used to evaluate the motor component. Care was taken not to lesion any nerves that demonstrated motor stimulation of the lower extremities at an output of less than 2.5 times that of the sensory threshold, or a maximum of 2.0 V. Lesioning Technique Parameters: Standard Radiofrequency settings. (Not bipolar or pulsed.) Temperature Settings: 80  degrees C Lesioning time: 60 seconds Intra-operative Compliance: Compliant Materials & Medications: Needle(s) (Electrode/Cannula) Type: Teflon-coated, curved tip, Radiofrequency needle(s) Gauge: 22G Length: 10cm Numbing solution: 5 cc solution made of 3cc of 0.2% ropivacaine, 2 cc of Decadron 10 mg/cc.  1 cc injected at each level above after sensorimotor testing prior to lesioning.  The unused portion of the solution was discarded in the proper designated containers.  Once the entire procedure was completed, the treated area was cleaned, making sure to leave some of the prepping solution back to take advantage of its long term bactericidal properties.  Illustration of the posterior view of the lumbar spine and the posterior neural structures. Laminae of L2 through S1 are labeled. DPRL5, dorsal primary ramus of L5; DPRS1, dorsal primary ramus of S1; DPR3, dorsal primary ramus of L3; FJ, facet (zygapophyseal) joint L3-L4; I, inferior articular process of L4; LB1, lateral branch of dorsal primary ramus of L1; IAB, inferior articular branches from L3 medial branch (supplies L4-L5 facet joint); IBP, intermediate branch plexus; MB3, medial branch of dorsal primary ramus of L3; NR3, third lumbar nerve root; S, superior articular process of L5; SAB, superior articular branches from L4 (supplies L4-5 facet joint also); TP3, transverse process of L3.  Vitals:   10/05/19 0904 10/05/19 0913 10/05/19 0923 10/05/19 0933  BP: 138/74 (!) 142/76 (!) 145/78 134/77  Pulse:      Resp: 16 13 16 16   Temp:  (!) 97.1 F (36.2 C)  (!) 97.2 F (36.2 C)  TempSrc:      SpO2: 92% 98% 95% 97%  Weight:      Height:        Start Time: 0837 hrs. End Time: 0904 hrs.  Imaging Guidance (Spinal):          Type of Imaging Technique: Fluoroscopy Guidance (Spinal) Indication(s): Assistance in needle guidance and placement for procedures requiring needle placement in or near specific anatomical locations not easily  accessible without such assistance. Exposure Time: Please see nurses notes. Contrast: None used. Fluoroscopic Guidance: I was personally present during the use of fluoroscopy. "Tunnel Vision Technique" used to obtain the best possible view of  the target area. Parallax error corrected before commencing the procedure. "Direction-depth-direction" technique used to introduce the needle under continuous pulsed fluoroscopy. Once target was reached, antero-posterior, oblique, and lateral fluoroscopic projection used confirm needle placement in all planes. Images permanently stored in EMR. Interpretation: No contrast injected. I personally interpreted the imaging intraoperatively. Adequate needle placement confirmed in multiple planes. Permanent images saved into the patient's record.  Antibiotic Prophylaxis:   Anti-infectives (From admission, onward)   None     Indication(s): None identified  Post-operative Assessment:  Post-procedure Vital Signs:  Pulse/HCG Rate: 7673 Temp: (!) 97.2 F (36.2 C) Resp: 16 BP: 134/77 SpO2: 97 %  EBL: None  Complications: No immediate post-treatment complications observed by team, or reported by patient.  Note: The patient tolerated the entire procedure well. A repeat set of vitals were taken after the procedure and the patient was kept under observation following institutional policy, for this type of procedure. Post-procedural neurological assessment was performed, showing return to baseline, prior to discharge. The patient was provided with post-procedure discharge instructions, including a section on how to identify potential problems. Should any problems arise concerning this procedure, the patient was given instructions to immediately contact us, at any time, without hesitation. In any case, we plan to contact the patient by telephone for a follow-up status report regarding this interventional procedure.  Comments:  No additional relevant  information.  Plan of Care  Orders:  Orders Placed This Encounter  Procedures  . DG PA[facility] C-ARM 1-60 MIN NO REPORT    Intraoperative interpretation by procedural physician at [place].    Standing Status:   Standing    Number of Occurrences:   1    Order Specific Question:   Reason for exam:    Answer:   Assistance in needle guidance and placement for procedures requiring needle placement in or near specific anatomical locations not easily accessible without such assistance.    Medications ordered for procedure: Meds ordered this encounter  Medications  . lidocaine (XYLOCAINE) 2 % (with pres) injection 400 mg  . fentaNYL (SUBLIMAZE) injection 25-50 mcg    Make sure Narcan is available in the pyxis when using this medication. In the event of respiratory depression (RR< 8/min): Titrate NARCAN (naloxone) in increments of 0.1 to 0.2 mg IV at 2-3 minute intervals, until desired degree of reversal.  . ropivacaine (PF) 2 mg/mL (0.2%) (NAROPIN) injection 9 mL  . dexamethasone (DECADRON) injection 20 mg  . dexamethasone (DECADRON) injection 10 mg   Medications administered: We administered lidocaine, fentaNYL, ropivacaine (PF) 2 mg/mL (0.2%), dexamethasone, and dexamethasone.  See the medical record for exact dosing, route, and time of administration.  Follow-up plan:   Return in about 6 weeks (around 11/16/2019) for Post Procedure Evaluation, virtual.      Hx of R hip replacement, s/p R L4/5 ESI #1, not effective. Trial of lumbar facet diagnostic blocks at L3,4,5 S1 b/l on 05/09/2019 and 06/13/2019,  L3,4,5, S1 RFA on RIGHT 08/31/2019, LEFT 10/04/2018     Recent Visits Date Type Provider Dept  08/31/19 Procedure visit Gillis Santa, MD [facility]  07/11/19 Office Visit Gillis Santa, MD [facility]  Showing recent visits within past 90 days and meeting all other requirements   Today's Visits Date Type Provider Dept  10/05/19 Procedure visit Gillis Santa, MD [facility]  Showing today's visits and meeting all other requirements   Future Appointments Date Type Provider Dept  11/16/19 Appointment Gillis Santa, MD  [facility]  Showing future appointments within next 90 days and meeting all other requirements   Disposition: Discharge home  Discharge Date & Time: 10/05/2019; 0935 hrs.   Primary Care Physician: Idelle Crouch, MD Location: [facility] Note by: Gillis Santa, MD Date: 10/05/2019; Time: 9:56 AM  Disclaimer:  Medicine is not an exact science. The only guarantee in medicine is that nothing is guaranteed. It is important to note that the decision to proceed with this intervention was based on the information collected from the patient. The Data and conclusions were drawn from the patient's questionnaire, the interview, and the physical examination. Because the information was provided in large part by the patient, it cannot be guaranteed that it has not been purposely or unconsciously manipulated. Every effort has been made to obtain as much relevant data as possible for this evaluation. It is important to note that the conclusions that lead to this procedure are derived in large part from the available data. Always take into account that the treatment will also be dependent on availability of resources and existing treatment guidelines, considered by other Pain Management Practitioners as being common knowledge and practice, at the time of the intervention. For Medico-Legal purposes, it is also important to point out that variation in procedural techniques and pharmacological choices are the acceptable norm. The indications, contraindications, technique, and results of the above procedure should only be interpreted and judged by a Board-Certified Interventional Pain Specialist with extensive familiarity and expertise in the same exact procedure and technique.

## 2019-10-07 ENCOUNTER — Telehealth: Payer: Self-pay

## 2019-10-07 NOTE — Telephone Encounter (Signed)
Denies any needs at this time. Instructed to call if needed. 

## 2019-10-14 ENCOUNTER — Other Ambulatory Visit: Payer: Self-pay | Admitting: Obstetrics and Gynecology

## 2019-10-14 DIAGNOSIS — L900 Lichen sclerosus et atrophicus: Secondary | ICD-10-CM

## 2019-10-15 ENCOUNTER — Other Ambulatory Visit: Payer: Self-pay | Admitting: Obstetrics and Gynecology

## 2019-11-02 ENCOUNTER — Other Ambulatory Visit: Payer: Self-pay | Admitting: Obstetrics and Gynecology

## 2019-11-02 DIAGNOSIS — L900 Lichen sclerosus et atrophicus: Secondary | ICD-10-CM

## 2019-11-06 ENCOUNTER — Encounter: Payer: Self-pay | Admitting: Student in an Organized Health Care Education/Training Program

## 2019-11-07 ENCOUNTER — Other Ambulatory Visit: Payer: Self-pay | Admitting: Student in an Organized Health Care Education/Training Program

## 2019-11-07 DIAGNOSIS — G894 Chronic pain syndrome: Secondary | ICD-10-CM

## 2019-11-07 MED ORDER — DULOXETINE HCL 30 MG PO CPEP: 30.0000 mg | ORAL_CAPSULE | Freq: Every day | ORAL | 2 refills | Status: DC

## 2019-11-07 MED ORDER — DICLOFENAC SODIUM 75 MG PO TBEC: 75.0000 mg | DELAYED_RELEASE_TABLET | Freq: Two times a day (BID) | ORAL | 2 refills | Status: AC | PRN

## 2019-11-07 NOTE — Progress Notes (Signed)
Patient called and informed

## 2019-11-07 NOTE — Progress Notes (Unsigned)
Requested Prescriptions   Signed Prescriptions Disp Refills  . DULoxetine (CYMBALTA) 30 MG capsule 30 capsule 2    Sig: Take 1 capsule (30 mg total) by mouth daily.  . diclofenac (VOLTAREN) 75 MG EC tablet 60 tablet 2    Sig: Take 1 tablet (75 mg total) by mouth 2 (two) times daily as needed.

## 2019-11-14 ENCOUNTER — Encounter: Payer: Self-pay | Admitting: Student in an Organized Health Care Education/Training Program

## 2019-11-16 ENCOUNTER — Other Ambulatory Visit: Payer: Self-pay

## 2019-11-16 ENCOUNTER — Encounter: Payer: Self-pay | Admitting: Student in an Organized Health Care Education/Training Program

## 2019-11-16 ENCOUNTER — Ambulatory Visit
Payer: BC Managed Care – PPO | Attending: Student in an Organized Health Care Education/Training Program | Admitting: Student in an Organized Health Care Education/Training Program

## 2019-11-16 DIAGNOSIS — G894 Chronic pain syndrome: Secondary | ICD-10-CM

## 2019-11-16 DIAGNOSIS — M47816 Spondylosis without myelopathy or radiculopathy, lumbar region: Secondary | ICD-10-CM

## 2019-11-16 NOTE — Progress Notes (Signed)
Patient: Mackenzie [name]  Service Category: E/M  Provider: Gillis Santa, MD  DOB: 09/26/1955  DOS: 11/16/2019  Location: Office  MRN: [id number]  Setting: Ambulatory outpatient  Referring Provider: Idelle Crouch, MD  Type: Established Patient  Specialty: Interventional Pain Management  PCP: Idelle Crouch, MD  Location: Home  Delivery: TeleHealth     Virtual Encounter - Pain Management PROVIDER NOTE: Information contained herein reflects review and annotations entered in association with encounter. Interpretation of such information and data should be left to medically-trained personnel. Information provided to patient can be located elsewhere in the medical record under "Patient Instructions". Document created using STT-dictation technology, any transcriptional errors that may result from process are unintentional.    Contact & Pharmacy Preferred: [phone] Home: [phone] (home) Mobile: [phone] (mobile) E-mail: [email or web address]  [id number] [id number]: [phone]: [phone]  Pre-screening  Ms. Markuson offered "in-person" vs "virtual" encounter. She indicated preferring virtual for this encounter.   Reason COVID-19*  Social distancing based on CDC and AMA recommendations.   I contacted KKALISTA LAGUARDIAon 11/16/2019 via telephone.      I clearly identified myself as BGillis Santa MD. I verified that I was speaking with the correct person using two identifiers (Name: KHODA HON and date of birth: 112-12-1955.  This visit was completed via telephone due to the restrictions of the COVID-19 pandemic. All issues as above were discussed and addressed but no physical exam was performed. If it was felt that the patient should be evaluated in the office, they were directed there. The patient verbally consented to this visit. Patient was unable to complete an audio/visual visit due to Technical  difficulties and/or Lack of internet. Due to the catastrophic nature of the COVID-19 pandemic, this visit was done through audio contact only.  Location of the patient: home address (see Epic for details)  Location of the provider: office  Consent I sought verbal advanced consent from KCherylann Parrfor virtual visit interactions. I informed Ms. BBoerof possible security and privacy concerns, risks, and limitations associated with providing "not-in-person" medical evaluation and management services. I also informed Ms. BLozoyaof the availability of "in-person" appointments. Finally, I informed her that there would be a charge for the virtual visit and that she could be  personally, fully or partially, financially responsible for it. Ms. BKolodnyexpressed understanding and agreed to proceed.   Historic Elements   Ms. KSIDNEY SILBERMANis a 64y.o. year old, female patient evaluated today after her last contact with our practice on 10/07/2019. Ms. BBowdish has a past medical history of ADD (attention deficit disorder), Anemia, Anxiety, Barrett's esophagus (2019), Cold sore, Colon polyps, COPD (chronic obstructive pulmonary disease) ([place], Diverticulosis, Dyspnea, Environmental allergies, Essential hypertension, Genital herpes (05/2019), GERD (gastroesophageal reflux disease), History of hiatal hernia, History of nerve impingement, Hyperlipidemia, Lichen sclerosus, Obesity, Osteoarthritis, Other abnormal auditory perceptions, bilateral (2019), RLS (restless legs syndrome), Shingles (09/2018), Sleep apnea, Vitamin B12 deficiency, Weakness of back, and Wears contact lenses. She also  has a past surgical history that includes Bunionectomy (Right, 2010); Colonoscopy (2014); broken leg repair (Right); Colonoscopy with propofol (N/A, 05/24/2018); Esophagogastroduodenoscopy (egd) with propofol (N/A, 05/24/2018); Induced abortion; Plantar fascia surgery (Left, 2005); Breast cyst aspiration (Right, 1998); eye plugs (Bilateral); Total  hip arthroplasty (Right, 08/11/2018); Hallux valgus austin (Left, 08/11/2019); Aiken osteotomy (Left, 08/11/2019); Bone excision (Left, 08/11/2019); Joint replacement (Right); and  ORIF toe fracture (Left, 09/22/2019). Ms. Hoppel has a current medication list which includes the following prescription(s): alprazolam, amphetamine-dextroamphetamine, budesonide-formoterol, cholecalciferol, clobetasol ointment, cyanocobalamin, cyclobenzaprine, diclofenac, duloxetine, ferrous sulfate, gabapentin, hydrochlorothiazide, lovastatin, melatonin, pantoprazole, polyethylene glycol, sucralfate, tramadol, valacyclovir, and enoxaparin. She  reports that she quit smoking about 3 years ago. Her smoking use included cigarettes. She has a 5.00 pack-year smoking history. She has never used smokeless tobacco. She reports current alcohol use of about 1.0 - 4.0 standard drinks of alcohol per week. She reports that she does not use drugs. Ms. Hottel has No Known Allergies.   HPI  Today, she is being contacted for a post-procedure assessment.   Evaluation of last interventional procedure  10/05/2019 Procedure:   Type: Thermal Lumbar Facet, Medial Branch Radiofrequency Ablation/Neurotomy  #1  Primary Purpose: Therapeutic Region: Posterolateral Lumbosacral Spine Level:    L3, L4, L5, & S1 Medial Branch Level(s). These levels will denervate the L3-4, L4-5, and the L5-S1 lumbar facet joints.  Laterality: Left    L3,4,5, S1 RFA on RIGHT 08/31/2019  Sedation: Please see nurses note for DOS. When no sedatives are used, the analgesic levels obtained are directly associated to the effectiveness of the local anesthetics. However, when sedation is provided, the level of analgesia obtained during the initial 1 hour following the intervention, is believed to be the result of a combination of factors. These factors may include, but are not limited to: 1. The effectiveness of the local anesthetics used. 2. The effects of the analgesic(s) and/or  anxiolytic(s) used. 3. The degree of discomfort experienced by the patient at the time of the procedure. 4. The patients ability and reliability in recalling and recording the events. 5. The presence and influence of possible secondary gains and/or psychosocial factors. Reported result: Relief experienced during the 1st hour after the procedure: 50 % (Ultra-Short Term Relief)            Interpretative annotation: Clinically appropriate result. Analgesia during this period is likely to be Local Anesthetic and/or IV Sedative (Analgesic/Anxiolytic) related.          Effects of local anesthetic: The analgesic effects attained during this period are directly associated to the localized infiltration of local anesthetics and therefore cary significant diagnostic value as to the etiological location, or anatomical origin, of the pain. Expected duration of relief is directly dependent on the pharmacodynamics of the local anesthetic used. Long-acting (4-6 hours) anesthetics used.  Reported result: Relief during the next 4 to 6 hour after the procedure: 50 % (Short-Term Relief)            Interpretative annotation: Clinically appropriate result.             Long-term benefit: Defined as the period of time past the expected duration of local anesthetics (1 hour for short-acting and 4-6 hours for long-acting). With the possible exception of prolonged sympathetic blockade from the local anesthetics, benefits during this period are typically attributed to, or associated with, other factors such as analgesic sensory neuropraxia, antiinflammatory effects, or beneficial biochemical changes provided by agents other than the local anesthetics.  Reported result: Extended relief following procedure: 40 %-50% improved since RFA (Long-Term Relief)            Interpretative annotation: Clinically appropriate result. Partial relief.       Benefit could signal adequate RF ablation.  Laboratory Chemistry Profile   Renal Lab  Results  Component Value Date   BUN 16 07/28/2018   CREATININE 0.73 08/11/2018  GFRAA >60 08/11/2018   GFRNONAA >60 08/11/2018    Hepatic Lab Results  Component Value Date   AST 16 07/28/2018   ALT 16 07/28/2018   ALBUMIN 4.1 07/28/2018   ALKPHOS 72 07/28/2018    Electrolytes Lab Results  Component Value Date   NA 139 08/11/2018   K 3.9 08/11/2018   CL 103 07/28/2018   CALCIUM 9.1 07/28/2018    Bone No results found for: VD25OH, VD125OH2TOT, SA6301SW1, UX3235TD3, 25OHVITD1, 25OHVITD2, 25OHVITD3, TESTOFREE, TESTOSTERONE  Inflammation (CRP: Acute Phase) (ESR: Chronic Phase) Lab Results  Component Value Date   CRP <0.8 07/28/2018   ESRSEDRATE 8 07/28/2018      Note: Above Lab results reviewed.   Assessment  The primary encounter diagnosis was Lumbar spondylosis. Diagnoses of Lumbar facet arthropathy and Chronic pain syndrome were also pertinent to this visit.  Plan of Care   Ms. JOLLEEN SEMAN has a current medication list which includes the following long-term medication(s): budesonide-formoterol, duloxetine, ferrous sulfate, gabapentin, hydrochlorothiazide, lovastatin, pantoprazole, sucralfate, and enoxaparin.  Patient is status post lumbar radiofrequency ablation as above.  She states that it has helped with her low back pain.  She rates approximately 50% pain relief.  Of note patient has been in left foot boot as a result of a closed displaced fracture status post surgery.  She states that this is also impacted her interpretation of her pain relief from the ablation.  Overall we will continue to monitor.  Patient is not utilizing Celebrex I will take that off her medication list.  She continues diclofenac twice daily as needed.  She continues Cymbalta and gabapentin as well.  I will have the patient follow-up in approximately 3 months for refill of medications including Cymbalta diclofenac and to evaluate how her low back pain is doing.   Follow-up plan:   Return in about 3  months (around 02/16/2020) for Medication Management, in person.     Hx of R hip replacement, s/p R L4/5 ESI #1, not effective. Trial of lumbar facet diagnostic blocks at L3,4,5 S1 b/l on 05/09/2019 and 06/13/2019,  L3,4,5, S1 RFA on RIGHT 08/31/2019, LEFT 10/04/2018      Recent Visits Date Type Provider Dept  10/05/19 Procedure visit Gillis Santa, MD [facility]  08/31/19 Procedure visit Gillis Santa, MD [facility]  Showing recent visits within past 90 days and meeting all other requirements   Today's Visits Date Type Provider Dept  11/16/19 Office Visit Gillis Santa, MD [facility]  Showing today's visits and meeting all other requirements   Future Appointments No visits were found meeting these conditions.  Showing future appointments within next 90 days and meeting all other requirements   I discussed the assessment and treatment plan with the patient. The patient was provided an opportunity to ask questions and all were answered. The patient agreed with the plan and demonstrated an understanding of the instructions.  Patient advised to call back or seek an in-person evaluation if the symptoms or condition worsens.  Duration of encounter: 15 minutes.  Note by: Gillis Santa, MD Date: 11/16/2019; Time: 11:40 AM

## 2020-01-12 ENCOUNTER — Telehealth: Payer: Self-pay

## 2020-01-12 NOTE — Telephone Encounter (Signed)
Pt needs appt to eval.

## 2020-01-12 NOTE — Telephone Encounter (Signed)
Spoke w/patient. It started about a week ago. She started taking valtrex when symptoms started and it has not helped. Admits to d/c w/fishy odor, some itching. "feels like hot lava when she urinates".

## 2020-01-12 NOTE — Telephone Encounter (Signed)
Patient report she has a very bad burn inside. Requesting medicine/vaginal cream if possible. [id number]

## 2020-01-13 ENCOUNTER — Other Ambulatory Visit: Payer: Self-pay

## 2020-01-13 ENCOUNTER — Encounter: Payer: Self-pay | Admitting: Obstetrics and Gynecology

## 2020-01-13 ENCOUNTER — Ambulatory Visit (INDEPENDENT_AMBULATORY_CARE_PROVIDER_SITE_OTHER): Payer: BC Managed Care – PPO | Admitting: Obstetrics and Gynecology

## 2020-01-13 VITALS — BP 150/86 | Ht 68.0 in | Wt 262.0 lb

## 2020-01-13 DIAGNOSIS — N898 Other specified noninflammatory disorders of vagina: Secondary | ICD-10-CM | POA: Diagnosis not present

## 2020-01-13 DIAGNOSIS — A6004 Herpesviral vulvovaginitis: Secondary | ICD-10-CM | POA: Diagnosis not present

## 2020-01-13 DIAGNOSIS — L900 Lichen sclerosus et atrophicus: Secondary | ICD-10-CM

## 2020-01-13 MED ORDER — VALACYCLOVIR HCL 500 MG PO TABS: 500.0000 mg | ORAL_TABLET | Freq: Every day | ORAL | 0 refills | Status: DC

## 2020-01-13 MED ORDER — FLUCONAZOLE 150 MG PO TABS: 150.0000 mg | ORAL_TABLET | Freq: Once | ORAL | 0 refills | Status: AC

## 2020-01-13 MED ORDER — CLOBETASOL PROPIONATE 0.05 % EX OINT: TOPICAL_OINTMENT | CUTANEOUS | 1 refills | Status: DC

## 2020-01-13 NOTE — Progress Notes (Signed)
Mackenzie Crouch, MD   Chief Complaint  Patient presents with  . Vaginal Odor    itchiness and irritation x 1 week  . Urinary Tract Infection    frequency and burning urinating x 1 week    HPI:      Mackenzie [name] is a 64 y.o. [id number] whose LMP was No LMP recorded. Patient is postmenopausal., presents today for severe vaginal burning and discomfort for over a wk. Hx of HSV and gets outbreaks every month or so; treats with valtrex episodically. Has been taking valtrex 500 mg TID to QID the past wk for sx without relief this time. Also has long hx of LS. Treats with clobetasol prn but hadn't been using it till recently. Now using every time she goes to bathroom for the past wk. Also treating with vaseline. Very uncomfortable.  No fishy vag odor, no increased d/c. Has urinary frequency and pain with urination but doesn't feel like UTI. No LBP, pelvic pain, fevers. No hematuria.   Past Medical History:  Diagnosis Date  . ADD (attention deficit disorder)   . Anemia    iron deficiency, b12, taking iron  . Anxiety   . Barrett's esophagus 2019  . Cold sore   . Colon polyps   . COPD (chronic obstructive pulmonary disease) ([place])   . Diverticulosis   . Dyspnea    on exertion  . Environmental allergies   . Essential hypertension   . Genital herpes 05/2019   neg culture, positive type 2 IgG.  . GERD (gastroesophageal reflux disease)   . History of hiatal hernia   . History of nerve impingement    Sees Dr. Holley Raring, Pain Management  . Hyperlipidemia   . Lichen sclerosus   . Obesity   . Osteoarthritis    hands, feet, knees  . Other abnormal auditory perceptions, bilateral 2019   hip and back issues  . RLS (restless legs syndrome)   . Shingles 09/2018  . Sleep apnea    CPAP  . Vitamin B12 deficiency   . Weakness of back    right side back and leg nerves burned / now burning left side  . Wears contact lenses     Past Surgical History:  Procedure Laterality Date  . Barbie Banner  OSTEOTOMY Left 08/11/2019   Procedure: PHALANX OSTEOTOMY AKIN;  Surgeon: Albertine Patricia, DPM;  Location: [place];  Service: Podiatry;  Laterality: Left;  . BONE EXCISION Left 08/11/2019   Procedure: SADDLEBONE;  Surgeon: Albertine Patricia, DPM;  Location: [place];  Service: Podiatry;  Laterality: Left;  sleep apnea  . BREAST CYST ASPIRATION Right 1998   neg  . broken leg repair Right   . BUNIONECTOMY Right 2010   pins in toes of right foot  . COLONOSCOPY  2014  . COLONOSCOPY WITH PROPOFOL N/A 05/24/2018   Procedure: COLONOSCOPY WITH PROPOFOL;  Surgeon: Lollie Sails, MD;  Location[facility] ENDOSCOPY;  Service: Endoscopy;  Laterality: N/A;  . ESOPHAGOGASTRODUODENOSCOPY (EGD) WITH PROPOFOL N/A 05/24/2018   Procedure: ESOPHAGOGASTRODUODENOSCOPY (EGD) WITH PROPOFOL;  Surgeon: Lollie Sails, MD;  Location[facility] END[facility]COPY;  Service: Endoscopy;  Laterality: N/A;  . eye plugs Bilateral    plugs placed for dry eyes  . HALLUX VALGUS AUSTIN Left 08/11/2019   Procedure: AUSTIN (MITCHELL);  Surgeon: Albertine Patricia, DPM;  Location: [place];  Service: Podiatry;  Laterality: Left;  . INDUCED ABORTION    . JOINT REPLACEMENT Right    hip  .  ORIF TOE FRACTURE Left 09/22/2019   Procedure: REMOVAL OF SCREWS WITH OPEN REDUCTION INTERNAL FIXATION (ORIF) METATARSAL (GREAT TOE);  Surgeon: Albertine Patricia, DPM;  Location: [place];  Service: Podiatry;  Laterality: Left;  pt's splint was removed and cast was applied in PACU by MD  . PLANTAR FASCIA SURGERY Left 2005  . TOTAL HIP ARTHROPLASTY Right 08/11/2018   Procedure: TOTAL HIP ARTHROPLASTY;  Surgeon: Dereck Leep, MD;  Location: [facility];  Service: Orthopedics;  Laterality: Right;    Family History  Problem Relation Age of Onset  . Breast cancer Cousin 74       mat second cousin  . Liver cancer Maternal Aunt   . Alcoholism Mother   . Hypertension Mother   . Diabetes Mother        type 1  . Congestive  Heart Failure Mother   . Obesity Mother   . Diabetes Maternal Grandmother        type 2  . Breast cancer Paternal Grandmother 70  . Arthritis Sister   . Diabetes Sister   . Stroke Brother 68  . Alcoholism Maternal Grandfather     Social History   Socioeconomic History  . Marital status: Single    Spouse name: s.o. michael  . Number of children: 2  . Years of education: 79  . Highest education level: Not on file  Occupational History  . Occupation: Surveyor, minerals    Comment: school Network engineer  Tobacco Use  . Smoking status: Former Smoker    Packs/day: 0.50    Years: 10.00    Pack years: 5.00    Types: Cigarettes    Quit date: 07/22/2016    Years since quitting: 3.4  . Smokeless tobacco: Never Used  Substance and Sexual Activity  . Alcohol use: Yes    Alcohol/week: 1.0 - 4.0 standard drinks    Types: 1 - 2 Shots of liquor per week    Comment: occasional  . Drug use: No  . Sexual activity: Not Currently    Partners: Male    Birth control/protection: Post-menopausal  Other Topics Concern  . Not on file  Social History Narrative  . Not on file   Social Determinants of Health   Financial Resource Strain:   . Difficulty of Paying Living Expenses:   Food Insecurity:   . Worried About Charity fundraiser in the Last Year:   . Arboriculturist in the Last Year:   Transportation Needs:   . Film/video editor (Medical):   Marland Kitchen Lack of Transportation (Non-Medical):   Physical Activity:   . Days of Exercise per Week:   . Minutes of Exercise per Session:   Stress:   . Feeling of Stress :   Social Connections:   . Frequency of Communication with Friends and Family:   . Frequency of Social Gatherings with Friends and Family:   . Attends Religious Services:   . Active Member of Clubs or Organizations:   . Attends Archivist Meetings:   Marland Kitchen Marital Status:   Intimate Partner Violence:   . Fear of Current or Ex-Partner:   . Emotionally Abused:   Marland Kitchen Physically  Abused:   . Sexually Abused:     Outpatient Medications Prior to Visit  Medication Sig Dispense Refill  . ALPRAZolam (XANAX) 1 MG tablet TAKE 1 TABLET BY MOUTH NIGHTLY AS NEEDED FOR SLEEP    . budesonide-formoterol (SYMBICORT) 160-4.5 MCG/ACT inhaler Inhale 2 puffs into the lungs 2 (  two) times daily. Am and lunch    . cholecalciferol (VITAMIN D3) 25 MCG (1000 UT) tablet Take 1,000 Units by mouth every 3 (three) days.    . cyanocobalamin (,VITAMIN B-12,) 1000 MCG/ML injection Inject into the muscle every 30 (thirty) days.     . cyclobenzaprine (FLEXERIL) 10 MG tablet Take 10 mg by mouth daily as needed for muscle spasms.     . diclofenac (VOLTAREN) 75 MG EC tablet Take 1 tablet (75 mg total) by mouth 2 (two) times daily as needed. 60 tablet 2  . ferrous sulfate 325 (65 FE) MG tablet Take 325 mg by mouth 2 (two) times daily with a meal.   3  . gabapentin (NEURONTIN) 300 MG capsule TAKE 1 CAPSULE BY MOUTH THREE TIMES A DAY    . hydrochlorothiazide (HYDRODIURIL) 25 MG tablet Take 25 mg by mouth daily. am  11  . lovastatin (MEVACOR) 40 MG tablet Take 40 mg by mouth at bedtime.   3  . Melatonin 3 MG CAPS Take by mouth at bedtime.    . pantoprazole (PROTONIX) 40 MG tablet Take 40 mg by mouth daily. am  3  . polyethylene glycol (MIRALAX / GLYCOLAX) packet Take 17 g by mouth at bedtime.    . sucralfate (CARAFATE) 1 g tablet Take 1 g by mouth 2 (two) times daily.     . clobetasol ointment (TEMOVATE) 0.05 % Apply to affected 2-3 times weekly (Patient taking differently: as needed. Apply to affected 2-3 times weekly) 45 g 1  . valACYclovir (VALTREX) 500 MG tablet Take 1 tablet (500 mg total) by mouth daily. Or BID for 3 days prn outbreak 30 tablet 2  . DULoxetine (CYMBALTA) 30 MG capsule Take 1 capsule (30 mg total) by mouth daily. 30 capsule 2  . amphetamine-dextroamphetamine (ADDERALL) 30 MG tablet Take 30 mg by mouth 2 (two) times daily.    Marland Kitchen enoxaparin (LOVENOX) 40 MG/0.4ML injection Inject 0.4 mLs  (40 mg total) into the skin daily. 2 mL 0  . traMADol (ULTRAM) 50 MG tablet Take 50 mg by mouth every 8 (eight) hours as needed.     No facility-administered medications prior to visit.      ROS:  Review of Systems  Constitutional: Negative for fever.  Gastrointestinal: Negative for blood in stool, constipation, diarrhea, nausea and vomiting.  Genitourinary: Positive for dysuria, frequency and vaginal pain. Negative for dyspareunia, flank pain, hematuria, urgency, vaginal bleeding and vaginal discharge.  Musculoskeletal: Negative for back pain.  Skin: Positive for rash and wound.    OBJECTIVE:   Vitals:  BP (!) 150/86   Ht 5\' 8"  (1.727 m)   Wt 262 lb (118.8 kg)   BMI 39.84 kg/m   Physical Exam Vitals reviewed.  Constitutional:      Appearance: She is well-developed.  Pulmonary:     Effort: Pulmonary effort is normal.  Genitourinary:    General: Normal vulva.     Pubic Area: No rash.      Labia:        Right: Rash, tenderness and lesion present.        Left: Rash, tenderness and lesion present.        Comments: ERYTHEMA WITH FISSURES/LOSS OF CLITORIS AND LABIA MINORA;  PERIANAL AREA HYPERTROPHIED SKIN WITH ERYTHEMA AND SKIN BREAKDOWN; NO ULCERATIVE LESIONS BUT AREAS OF "CUT OUT" PATCHES Musculoskeletal:        General: Normal range of motion.     Cervical back: Normal range of motion.  Skin:  General: Skin is warm and dry.  Neurological:     General: No focal deficit present.     Mental Status: She is alert and oriented to person, place, and time.  Psychiatric:        Mood and Affect: Mood normal.        Behavior: Behavior normal.        Thought Content: Thought content normal.        Judgment: Judgment normal.     Assessment/Plan: Lichen sclerosus et atrophicus - Plan: clobetasol ointment (TEMOVATE) 0.05 %; LS flare. Pt to decrease clobetasol to QHS for 4 wks. Has annual 02/07/20 with [facility] and will f/u then. If sx under control, can reduce to QOHS for 4 wks,  then twice wkly. Recommended regular treatment instead of prn to prevent flare.  Herpes simplex vulvovaginitis - Plan: valACYclovir (VALTREX) 500 MG tablet; Start valtrex daily instead of episodically. Rx RF.   Vaginal irritation - Plan: fluconazole (DIFLUCAN) 150 MG tablet; Due to LS and HSV.  Rx diflucan prn any yeast ext. Vulva very irritated. Cool compresses/sitz baths, A&D oint as moisture barrier. F/u at annual/sooner prn.    Meds ordered this encounter  Medications  . clobetasol ointment (TEMOVATE) 0.05 %    Sig: Apply vaginally nightly for 4 wks, then QOHS for 4 wks, then twice wkly as maintenance    Dispense:  45 g    Refill:  1    Order Specific Question:   Supervising Provider    Answer:   Gae Dry [id number]  . valACYclovir (VALTREX) 500 MG tablet    Sig: Take 1 tablet (500 mg total) by mouth daily.    Dispense:  90 tablet    Refill:  0    Order Specific Question:   Supervising Provider    Answer:   Gae Dry [id number]  . fluconazole (DIFLUCAN) 150 MG tablet    Sig: Take 1 tablet (150 mg total) by mouth once for 1 dose.    Dispense:  1 tablet    Refill:  0    Order Specific Question:   Supervising Provider    Answer:   Gae Dry [id number]      Return if symptoms worsen or fail to improve.  Json Koelzer B. Antwuan Eckley, PA-C 01/13/2020 5:16 PM

## 2020-01-13 NOTE — Telephone Encounter (Signed)
Patient is scheduled for 01/13/20 at 4:30

## 2020-01-13 NOTE — Patient Instructions (Signed)
I value your feedback and entrusting us with your care. If you get a [facility] patient survey, I would appreciate you taking the time to let us know about your experience today. Thank you!  As of August 18, 2019, your lab results will be released to your MyChart immediately, before I even have a chance to see them. Please give me time to review them and contact you if there are any abnormalities. Thank you for your patience.  

## 2020-02-06 NOTE — Progress Notes (Addendum)
Gynecology Annual Exam  PCP: Idelle Crouch, MD  Chief Complaint:  Chief Complaint  Patient presents with  . Gynecologic Exam    History of Present Illness:Mackenzie [name] is a 64 year old White female , G 3 P 2 0 1 2 who presents for her annual exam. She has lichen sclerosis and has had occasional vulvar itching and uses the clobetasol prn itching.  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 She has been having outbreaks every month since then even though she. In May 2021 she presented with vulvar burning and was noted to have fissures and whitening around her clitoris.  Since then she has been taking Valtrex daily and applying clobetasol ointment daily. The burning improved, never went away and is now starting to worsen again.  Her menses are absent and she is postmenopausal since 2011.  She had some spotting when she presented in September with vulvar lesions.. Has an occasional hot flash   The patient's past medical history is notable for a history of lichen sclerosis, anemia, osteoarthritis, vitamin B12 deficiency, hyperlipidemia, GERD, COPD,  hypertension,and ADD.Marland Kitchen  Since her last annual GYN exam dated 06/09/2018, she has had a right hip replacement. She also had a bunionectomy on the left and had to have a second surgery on this area in January 2021.She is currently having problems with feeling like she needs to move her legs or stretch her legs at night. She thinks she may have restless legs. Has started taking a OTC medicine for this problem. She  had a colonoscopy September 2019 which showed diverticulosis and an EGD that revealed esophagitis, gastritis and a small hiatal hernia. Her next colonoscopy is due in 10? years. She is not currenltly sexually active.  Her most recent pap smear was obtained 06/09/2018  and was NIL. Marland Kitchen She has had five normal Pap smears since her  ASCUS R/O HGSIL Pap and a colpo/bx that was negative in 2012.  Her most recent mammogram obtained on 03/03/2019 was negative. There is a positive history of breast cancer in her maternal second cousin and paternal grandmother. Genetic testing has not been done. There is no family history of ovarian cancer. The patient does not do monthly self breast exams.     She had a bone density scan in 2015 that showed mild osteopenia of her spine, but hip T score was normal.   The patient is a former smoker.  The patient does drink 2 drinks/week. The patient does not use illegal drugs.  The patient does not exercise..  The patient may not get adequate calcium in her diet.  She had a recent cholesterol screen in 2020 which was normal on her statin   The patient denies current symptoms of depression.    Review of Systems: Review of Systems  Constitutional: Negative for chills, fever and weight loss.  HENT: Negative for congestion, sinus pain and sore throat.   Eyes: Negative for blurred vision and pain.  Respiratory: Negative for hemoptysis, shortness of breath and wheezing.   Cardiovascular: Negative for chest pain, palpitations and leg swelling.  Gastrointestinal: Negative for abdominal pain, blood in stool, diarrhea, heartburn, nausea and vomiting.  Genitourinary: Negative for dysuria, frequency, hematuria and urgency.       Positive for vulvar burning  Musculoskeletal: Positive for back pain and joint pain (hip and foot pain). Negative  for myalgias.  Skin: Negative for itching and rash.  Neurological: Positive for sensory change (in lower extremities). Negative for dizziness, tingling and headaches.  Endo/Heme/Allergies: Positive for environmental allergies. Negative for polydipsia. Does not bruise/bleed easily.       Negative for hirsutism   Psychiatric/Behavioral: Negative for depression. The patient is not nervous/anxious and does not have insomnia.     Past Medical History:  Past Medical  History:  Diagnosis Date  . ADD (attention deficit disorder)   . Anemia    iron deficiency, b12, taking iron  . Anxiety   . Barrett's esophagus 2019  . Cold sore   . Colon polyps   . COPD (chronic obstructive pulmonary disease) ([place])   . Diverticulosis   . Dyspnea    on exertion  . Environmental allergies   . Essential hypertension   . Genital herpes 05/2019   neg culture, positive type 2 IgG.  . GERD (gastroesophageal reflux disease)   . History of hiatal hernia   . History of nerve impingement    Sees Dr. Holley Raring, Pain Management  . Hyperlipidemia   . Lichen sclerosus   . Obesity   . Osteoarthritis    hands, feet, knees  . Other abnormal auditory perceptions, bilateral 2019   hip and back issues  . RLS (restless legs syndrome)   . Shingles 09/2018  . Sleep apnea    CPAP  . Vitamin B12 deficiency   . Weakness of back    right side back and leg nerves burned / now burning left side  . Wears contact lenses     Past Surgical History:  Past Surgical History:  Procedure Laterality Date  . Barbie Banner OSTEOTOMY Left 08/11/2019   Procedure: PHALANX OSTEOTOMY AKIN;  Surgeon: Albertine Patricia, DPM;  Location: [place];  Service: Podiatry;  Laterality: Left;  . BONE EXCISION Left 08/11/2019   Procedure: SADDLEBONE;  Surgeon: Albertine Patricia, DPM;  Location: [place];  Service: Podiatry;  Laterality: Left;  sleep apnea  . BREAST CYST ASPIRATION Right 1998   neg  . broken leg repair Right   . BUNIONECTOMY Right 2010   pins in toes of right foot  . COLONOSCOPY  2014  . COLONOSCOPY WITH PROPOFOL N/A 05/24/2018   Procedure: COLONOSCOPY WITH PROPOFOL;  Surgeon: Lollie Sails, MD;  [facility];  Service: Endoscopy;  Laterality: N/A;  . ESOPHAGOGASTRODUODENOSCOPY (EGD) WITH PROPOFOL N/A 05/24/2018   Procedure: ESOPHAGOGASTRODUODENOSCOPY (EGD) WITH PROPOFOL;  Surgeon: Lollie Sails, MD;  [facility] ENDOSCOPY;  Service: Endoscopy;  Laterality:  N/A;  . eye plugs Bilateral    plugs placed for dry eyes  . HALLUX VALGUS AUSTIN Left 08/11/2019   Procedure: AUSTIN (MITCHELL);  Surgeon: Albertine Patricia, DPM;  Location: [place];  Service: Podiatry;  Laterality: Left;  . INDUCED ABORTION    . JOINT REPLACEMENT Right    hip  . ORIF TOE FRACTURE Left 09/22/2019   Procedure: REMOVAL OF SCREWS WITH OPEN REDUCTION INTERNAL FIXATION (ORIF) METATARSAL (GREAT TOE);  Surgeon: Albertine Patricia, DPM;  Location: [place];  Service: Podiatry;  Laterality: Left;  pt's splint was removed and cast was applied in PACU by MD  . PLANTAR FASCIA SURGERY Left 2005  . TOTAL HIP ARTHROPLASTY Right 08/11/2018   Procedure: TOTAL HIP ARTHROPLASTY;  Surgeon: Dereck Leep, MD;  Location: [facility];  Service: Orthopedics;  Laterality: Right;    Family History:  Family History  Problem Relation Age of Onset  . Breast  cancer Cousin 36       mat second cousin  . Liver cancer Maternal Aunt   . Alcoholism Mother   . Hypertension Mother   . Diabetes Mother        type 1  . Congestive Heart Failure Mother   . Obesity Mother   . Diabetes Maternal Grandmother        type 2  . Breast cancer Paternal Grandmother 62  . Arthritis Sister   . Diabetes Sister   . Stroke Brother 76  . Alcoholism Maternal Grandfather     Social History:  Social History   Socioeconomic History  . Marital status: Single    Spouse name: s.o. michael  . Number of children: 2  . Years of education: 60  . Highest education level: Not on file  Occupational History  . Occupation: Surveyor, minerals    Comment: school Network engineer  Tobacco Use  . Smoking status: Former Smoker    Packs/day: 0.50    Years: 10.00    Pack years: 5.00    Types: Cigarettes    Quit date: 07/22/2016    Years since quitting: 3.5  . Smokeless tobacco: Never Used  Substance and Sexual Activity  . Alcohol use: Yes    Alcohol/week: 1.0 - 4.0 standard drinks    Types: 1 - 2 Shots of liquor  per week    Comment: occasional  . Drug use: No  . Sexual activity: Not Currently    Partners: Male    Birth control/protection: Post-menopausal  Other Topics Concern  . Not on file  Social History Narrative  . Not on file   Social Determinants of Health   Financial Resource Strain:   . Difficulty of Paying Living Expenses:   Food Insecurity:   . Worried About Charity fundraiser in the Last Year:   . Arboriculturist in the Last Year:   Transportation Needs:   . Film/video editor (Medical):   Marland Kitchen Lack of Transportation (Non-Medical):   Physical Activity:   . Days of Exercise per Week:   . Minutes of Exercise per Session:   Stress:   . Feeling of Stress :   Social Connections:   . Frequency of Communication with Friends and Family:   . Frequency of Social Gatherings with Friends and Family:   . Attends Religious Services:   . Active Member of Clubs or Organizations:   . Attends Archivist Meetings:   Marland Kitchen Marital Status:   Intimate Partner Violence:   . Fear of Current or Ex-Partner:   . Emotionally Abused:   Marland Kitchen Physically Abused:   . Sexually Abused:     Allergies:  No Known Allergies  Medications:  Current Outpatient Medications:  .  ALPRAZolam (XANAX) 1 MG tablet, TAKE 1 TABLET BY MOUTH NIGHTLY AS NEEDED FOR SLEEP, Disp: , Rfl:  .  budesonide-formoterol (SYMBICORT) 160-4.5 MCG/ACT inhaler, Inhale 2 puffs into the lungs 2 (two) times daily. Am and lunch, Disp: , Rfl:  .  cholecalciferol (VITAMIN D3) 25 MCG (1000 UT) tablet, Take 1,000 Units by mouth every 3 (three) days., Disp: , Rfl:  .  clobetasol ointment (TEMOVATE) 0.05 %, Apply vaginally nightly for 4 wks, then QOHS for 4 wks, then twice wkly as maintenance, Disp: 45 g, Rfl: 1 .  cyanocobalamin (,VITAMIN B-12,) 1000 MCG/ML injection, Inject into the muscle every 30 (thirty) days. , Disp: , Rfl:  .  cyclobenzaprine (FLEXERIL) 10 MG tablet, Take 10 mg by  mouth daily as needed for muscle spasms. , Disp: ,  Rfl:  .  diclofenac (VOLTAREN) 75 MG EC tablet, Take 1 tablet (75 mg total) by mouth 2 (two) times daily as needed., Disp: 60 tablet, Rfl: 2 .  ferrous sulfate 325 (65 FE) MG tablet, Take 325 mg by mouth 2 (two) times daily with a meal. , Disp: , Rfl: 3 .  gabapentin (NEURONTIN) 300 MG capsule, TAKE 1 CAPSULE BY MOUTH THREE TIMES A DAY, Disp: , Rfl:  .  hydrochlorothiazide (HYDRODIURIL) 25 MG tablet, Take 25 mg by mouth daily. am, Disp: , Rfl: 11 .  lovastatin (MEVACOR) 40 MG tablet, Take 40 mg by mouth at bedtime. , Disp: , Rfl: 3 .  Melatonin 3 MG CAPS, Take by mouth at bedtime., Disp: , Rfl:  .  pantoprazole (PROTONIX) 40 MG tablet, Take 40 mg by mouth daily. am, Disp: , Rfl: 3 .  polyethylene glycol (MIRALAX / GLYCOLAX) packet, Take 17 g by mouth at bedtime., Disp: , Rfl:  .  sucralfate (CARAFATE) 1 g tablet, Take 1 g by mouth 2 (two) times daily. , Disp: , Rfl:  .  valACYclovir (VALTREX) 500 MG tablet, Take 1 tablet (500 mg total) by mouth daily., Disp: 90 tablet, Rfl: 0 .  DULoxetine (CYMBALTA) 30 MG capsule, Take 1 capsule (30 mg total) by mouth daily., Disp: 30 capsule, Rfl: 2   Physical Exam Vitals: BP 134/74   Ht 5\' 8"  (1.727 m)   Wt 261 lb 3.2 oz (118.5 kg)   BMI 39.72 kg/m , BMI 34.14 kg/m2  General: WF in NAD HEENT: normocephalic, anicteric Neck: no thyroid enlargement, no palpable nodules, no cervical lymphadenopathy  Pulmonary: No increased work of breathing, CTAB Cardiovascular: RRR, without murmur  Breast: Breast symmetrical, no tenderness, no palpable nodules or masses, no skin or nipple retraction present, no nipple discharge.  No axillary, infraclavicular or supraclavicular lymphadenopathy. Abdomen: Soft, non-tender, non-distended.  Umbilicus without lesions.  No hepatomegaly or masses palpable. No evidence of hernia. Genitourinary:  External: atrophic changes, architectural distortion with loss of labia minora. Whitening of clitoral area. Ulceration at right side of  introitus which palpates  Vagina: Flattened vaginal mucosa, no evidence of prolapse,    Cervix: Grossly normal in appearance, no bleeding, non-tender  Uterus: Normal size, shape, and consistency, mobile, and non-tender. Difficult exam due to body habitus  Adnexa: No adnexal masses, non-tender  Rectal: deferred  Lymphatic: no evidence of inguinal lymphadenopathy Extremities: mild edema of lower extremities/ feet Neurologic: Grossly intact Psychiatric: mood appropriate, affect full  Wet prep: negative for hyphae, negative for Trich or clue cells   Assessment: 64 y.o. [id number] well woman exam Lichen sclerosis et atrophicus-symptoms not resolved with clobetasol. Concern regarding other pathology like Paget's or SCC with  continued burning sensation  Referral to Dr Eugenia Mcalpine, [facility] to evaluate for vulvar neoplasm Plan:   1) Breast cancer screening - recommend monthly self breast exam and annual mammograms. Mammogram ordered and patient to schedule  2) Use clobetasol ointment 0.05% on clitoral area daily  3) Cervical cancer screening - Pap was not done. Pap smears to be done every 3 years  4) Routine healthcare maintenance including cholesterol and diabetes screening managed by PCP   5) Discussed calcium and vitamin D3 requirements to decrease risk of osteoporosis  6) Colon cancer screening: colonoscopy UTD, next due in 10 years?   7) RTO 1 year and prn  Dalia Heading, CNM

## 2020-02-07 ENCOUNTER — Other Ambulatory Visit: Payer: Self-pay

## 2020-02-07 ENCOUNTER — Ambulatory Visit (INDEPENDENT_AMBULATORY_CARE_PROVIDER_SITE_OTHER): Payer: BC Managed Care – PPO | Admitting: Certified Nurse Midwife

## 2020-02-07 ENCOUNTER — Other Ambulatory Visit: Payer: Self-pay | Admitting: Certified Nurse Midwife

## 2020-02-07 ENCOUNTER — Encounter: Payer: Self-pay | Admitting: Certified Nurse Midwife

## 2020-02-07 VITALS — BP 134/74 | Ht 68.0 in | Wt 261.2 lb

## 2020-02-07 DIAGNOSIS — L900 Lichen sclerosus et atrophicus: Secondary | ICD-10-CM

## 2020-02-07 DIAGNOSIS — Z1231 Encounter for screening mammogram for malignant neoplasm of breast: Secondary | ICD-10-CM

## 2020-02-14 ENCOUNTER — Encounter: Payer: BC Managed Care – PPO | Admitting: Student in an Organized Health Care Education/Training Program

## 2020-02-15 ENCOUNTER — Other Ambulatory Visit: Payer: Self-pay | Admitting: Orthopedic Surgery

## 2020-02-15 DIAGNOSIS — Z96641 Presence of right artificial hip joint: Secondary | ICD-10-CM

## 2020-02-21 ENCOUNTER — Encounter: Payer: BC Managed Care – PPO | Admitting: Student in an Organized Health Care Education/Training Program

## 2020-02-24 ENCOUNTER — Encounter
Admission: RE | Admit: 2020-02-24 | Discharge: 2020-02-24 | Disposition: A | Discharge status: Home / Self Care | Payer: BC Managed Care – PPO | Source: Ambulatory Visit | Attending: Orthopedic Surgery | Admitting: Orthopedic Surgery

## 2020-02-24 ENCOUNTER — Other Ambulatory Visit: Payer: BC Managed Care – PPO

## 2020-02-24 ENCOUNTER — Other Ambulatory Visit: Payer: Self-pay

## 2020-02-24 DIAGNOSIS — Z96641 Presence of right artificial hip joint: Secondary | ICD-10-CM | POA: Diagnosis present

## 2020-02-24 MED ORDER — TECHNETIUM TC 99M MEDRONATE IV KIT
20.0000 | PACK | Freq: Once | INTRAVENOUS | Status: AC | PRN
  Administered 2020-02-24: 23.69 via INTRAVENOUS

## 2020-02-27 ENCOUNTER — Other Ambulatory Visit: Payer: Self-pay

## 2020-02-27 ENCOUNTER — Ambulatory Visit (INDEPENDENT_AMBULATORY_CARE_PROVIDER_SITE_OTHER): Payer: BC Managed Care – PPO | Admitting: Obstetrics and Gynecology

## 2020-02-27 ENCOUNTER — Encounter: Payer: Self-pay | Admitting: Obstetrics and Gynecology

## 2020-02-27 ENCOUNTER — Other Ambulatory Visit (HOSPITAL_COMMUNITY)
Admission: RE | Admit: 2020-02-27 | Discharge: 2020-02-27 | Disposition: A | Discharge status: Home / Self Care | Payer: BC Managed Care – PPO | Source: Ambulatory Visit | Attending: Obstetrics and Gynecology | Admitting: Obstetrics and Gynecology

## 2020-02-27 VITALS — BP 130/72 | HR 68 | Resp 18 | Ht 68.0 in | Wt 262.2 lb

## 2020-02-27 DIAGNOSIS — N9089 Other specified noninflammatory disorders of vulva and perineum: Secondary | ICD-10-CM | POA: Diagnosis not present

## 2020-02-27 MED ORDER — IBUPROFEN 600 MG PO TABS: 600.0000 mg | ORAL_TABLET | Freq: Four times a day (QID) | ORAL | 3 refills | Status: DC | PRN

## 2020-02-27 MED ORDER — OXYCODONE HCL 5 MG PO TABS: 5.0000 mg | ORAL_TABLET | ORAL | 0 refills | Status: DC | PRN

## 2020-02-27 NOTE — Patient Instructions (Signed)
Care of a Perineal Biopsy A perineal tear is a cut or tear (laceration) in the tissue between the opening of the vagina and the anus (perineum). Some women develop a perineal tear during a vaginal birth. This can happen as the baby emerges from the birth canal and the perineum is stretched. There are four degrees of perineal tears based on how deep and long the laceration is:  First degree. This involves a shallow tear at the edge of the vaginal opening that extends slightly into the perineal skin.  Second degree. This involves tearing described in first degree perineal tear, and an additional deeper tear of the vaginal opening and perineal tissues. It may also include tearing of a muscle just under the perineal skin.  Third degree. This involves tearing described in first and second degree perineal tears, with the addition that tearing in the third degree extends into the muscle of the anus (anal sphincter).  Fourth degree. This involves all levels of tears described in first, second, and third degree perineal tears, with the tear in the fourth degree extending into the rectum. First and second degree perineal tears may or may not be stitched closed, depending on their location and appearance. Third and fourth degree perineal tears are stitched closed immediately after the baby's birth. What are the risks? Depending on the type of perineal tear you have, you may be at risk for:  Bleeding.  Developing a collection of blood in the perineal tear area (hematoma).  Pain. This may include pain when you urinate, or pain when you have a bowel movement.  Infection at the site of the tear.  Fever.  Trouble controlling your urination or bowels (incontinence).  Painful sex. How to care for a perineal tear Wound care  Take a sitz bath as told by your health care provider. A sitz bath is a warm water bath that is taken while you are sitting down. The water should only come up to your hips and should  cover your buttocks. This can speed up healing. 1. Partially fill a bathtub with warm water. You will only need the water to be deep enough to cover your hips and buttocks when you are sitting in it. 2. If your health care provider told you to put medicine in the water, follow the directions exactly as told. 3. Sit in the water and open the tub drain a little. 4. Turn on the warm water again to keep the tub at the correct level. Keep the water running constantly. 5. Soak in the water for 15-20 minutes or as told by your health care provider. 6. After the sitz bath, pat the affected area dry first. Do not rub it. 7. Be careful when you stand up after the sitz bath because you may feel dizzy.  Wash your hands before and after applying medicine to the area.  Wear a sanitary pad as told by your health care provider. Change the pad as often as told by your health care provider.  Leave stitches (sutures), skin glue, or adhesive strips in place. These skin closures may need to stay in place for 2 weeks or longer. If adhesive strip edges start to loosen and curl up, you may trim the loose edges. Do not remove adhesive strips completely unless your health care provider tells you to do that.  Check your wound every day for signs of infection. Check for: ? Redness, swelling, or pain. ? Fluid or blood. ? Warmth. ? Pus or a bad  smell. Managing pain  If directed, put ice on the painful area: ? Put ice in a plastic bag. ? Place a towel between your skin and the bag. ? Leave the ice on for 20 minutes, 2-3 times a day.  Apply a numbing spray to the perineal tear site as told by your health care provider. This may help with discomfort.  Take and apply over-the-counter and prescription medicines only as told by your health care provider.  If told, put about 3 witch hazel-containing hemorrhoid treatment pads on top of your sanitary pad. The witch hazel in the hemorrhoid pads helps with swelling and  discomfort.  Sit on an inflatable ring or pillow. This may provide comfort. General instructions  Squeeze warm water on your perineum after urinating. This should be done from front to back with a squeeze bottle. Pat the area to dry it.  Do not have sex, use tampons, or place anything in your vagina for at least 6 weeks or as told by your health care provider.  Keep all follow-up visits as told by your health care provider. These include any postpartum visits. This is important. Contact a health care provider if:  Your pain is not relieved with medicines.  You have painful urination.  You have redness, swelling, or pain around your tear.  You have fluid or blood coming from your tear.  Your tear feels warm to the touch.  You have pus or a bad smell coming from your tear.  You have a fever. Get help right away if:  Your tear opens.  You cannot urinate.  You have an increase in bleeding.  You have severe pain. Summary  A perineal tear is a cut or tear (laceration) in the tissue between the opening of the vagina and the anus (perineum).  There are four degrees of perineal tears based on how deep and long the laceration is.  First and second-degree perineal tears may or may not be stitched closed, depending on their location and appearance. Third and fourth- degree perineal tears are stitched closed immediately after the baby's birth.  Follow your health care provider's instructions for caring for your perineal tear. Know how to manage pain and how to care for your wound. Know when to call your health care provider and when to seek immediate emergency care. This information is not intended to replace advice given to you by your health care provider. Make sure you discuss any questions you have with your health care provider. Document Revised: 08/07/2017 Document Reviewed: 09/29/2016 Elsevier Patient Education  2020 [employer].

## 2020-02-27 NOTE — Progress Notes (Signed)
° °  GYNECOLOGY CLINIC COLPOSCOPY PROCEDURE NOTE  64 y.o. [id number] here for colposcopy for left vulvar lesion.   Is the patient  pregnant: No LMP: No LMP recorded. Patient is postmenopausal. Smoking status:  reports that she quit smoking about 3 years ago. Her smoking use included cigarettes. She has a 5.00 pack-year smoking history. She has never used smokeless tobacco. Contraception: none Future fertility desired:  No  Patient given informed consent, signed copy in the chart, time out was performed. Vulva inspected. Erosive lesion on left labia.  Area of biopsy injected with lidocaine.  68mm punch biopsy of left labia.  Hemostasis obtained with silver nitrate. All specimens were labeled and sent to pathology.   Patient was given post procedure instructions.  Will follow up pathology and manage accordingly.  Routine preventative health maintenance measures emphasized.  Physical Exam Genitourinary:        Adrian Prows MD [facility], [facility] 02/27/2020 12:01 PM

## 2020-02-28 ENCOUNTER — Encounter: Payer: BC Managed Care – PPO | Admitting: Student in an Organized Health Care Education/Training Program

## 2020-02-29 LAB — SURGICAL PATHOLOGY

## 2020-03-02 ENCOUNTER — Other Ambulatory Visit: Payer: Self-pay | Admitting: Obstetrics and Gynecology

## 2020-03-02 DIAGNOSIS — N9089 Other specified noninflammatory disorders of vulva and perineum: Secondary | ICD-10-CM

## 2020-03-02 MED ORDER — LIDOCAINE 5 % EX OINT: 1.0000 "application " | TOPICAL_OINTMENT | CUTANEOUS | 0 refills | Status: DC | PRN

## 2020-03-05 ENCOUNTER — Ambulatory Visit (INDEPENDENT_AMBULATORY_CARE_PROVIDER_SITE_OTHER): Payer: BC Managed Care – PPO | Admitting: Obstetrics and Gynecology

## 2020-03-05 ENCOUNTER — Encounter: Payer: Self-pay | Admitting: Obstetrics and Gynecology

## 2020-03-05 ENCOUNTER — Ambulatory Visit
Admission: RE | Admit: 2020-03-05 | Discharge: 2020-03-05 | Disposition: A | Discharge status: Home / Self Care | Payer: BC Managed Care – PPO | Source: Ambulatory Visit | Attending: Certified Nurse Midwife | Admitting: Certified Nurse Midwife

## 2020-03-05 ENCOUNTER — Other Ambulatory Visit: Payer: Self-pay

## 2020-03-05 VITALS — BP 138/76 | Wt 260.0 lb

## 2020-03-05 DIAGNOSIS — Z1231 Encounter for screening mammogram for malignant neoplasm of breast: Secondary | ICD-10-CM | POA: Insufficient documentation

## 2020-03-05 DIAGNOSIS — N904 Leukoplakia of vulva: Secondary | ICD-10-CM | POA: Diagnosis not present

## 2020-03-05 MED ORDER — TACROLIMUS 0.1 % EX OINT: TOPICAL_OINTMENT | Freq: Two times a day (BID) | CUTANEOUS | 3 refills | Status: DC

## 2020-03-05 NOTE — Progress Notes (Signed)
Patient ID: Mackenzie [name], female   DOB: [birth year], 64 y.o.   MRN: [id number]  Reason for Consult: Follow-up (Vulvar biopsy)   Referred by Idelle Crouch, MD  Subjective:     HPI:  Mackenzie [name] is a 64 y.o. female. She is following up today from her vulvar biopsy. She has been using topical lidocaine for pain control which has been working. She reports a small ulcerated lesion on the left inner vagina. On exam this is actually the area of the biopsy.   Past Medical History:  Diagnosis Date  . ADD (attention deficit disorder)   . Anemia    iron deficiency, b12, taking iron  . Anxiety   . Barrett's esophagus 2019  . Cold sore   . Colon polyps   . COPD (chronic obstructive pulmonary disease) ([place])   . Diverticulosis   . Dyspnea    on exertion  . Environmental allergies   . Essential hypertension   . Genital herpes 05/2019   neg culture, positive type 2 IgG.  . GERD (gastroesophageal reflux disease)   . History of hiatal hernia   . History of nerve impingement    Sees Dr. Holley Raring, Pain Management  . Hyperlipidemia   . Lichen sclerosus   . Obesity   . Osteoarthritis    hands, feet, knees  . Other abnormal auditory perceptions, bilateral 2019   hip and back issues  . RLS (restless legs syndrome)   . Shingles 09/2018  . Sleep apnea    CPAP  . Vitamin B12 deficiency   . Weakness of back    right side back and leg nerves burned / now burning left side  . Wears contact lenses    Family History  Problem Relation Age of Onset  . Breast cancer Cousin 65       mat second cousin  . Liver cancer Maternal Aunt   . Alcoholism Mother   . Hypertension Mother   . Diabetes Mother        type 1  . Congestive Heart Failure Mother   . Obesity Mother   . Diabetes Maternal Grandmother        type 2  . Breast cancer Paternal Grandmother 4  . Arthritis Sister   . Diabetes Sister   . Stroke Brother 55  . Alcoholism Maternal Grandfather    Past Surgical History:  Procedure  Laterality Date  . Barbie Banner OSTEOTOMY Left 08/11/2019   Procedure: PHALANX OSTEOTOMY AKIN;  Surgeon: Albertine Patricia, DPM;  Location: [place];  Service: Podiatry;  Laterality: Left;  . BONE EXCISION Left 08/11/2019   Procedure: SADDLEBONE;  Surgeon: Albertine Patricia, DPM;  Location: [place];  Service: Podiatry;  Laterality: Left;  sleep apnea  . BREAST CYST ASPIRATION Right 1998   neg  . broken leg repair Right   . BUNIONECTOMY Right 2010   pins in toes of right foot  . COLONOSCOPY  2014  . COLONOSCOPY WITH PROPOFOL N/A 05/24/2018   Procedure: COLONOSCOPY WITH PROPOFOL;  Surgeon: Lollie Sails, MD;  Location: [facility] ENDOSCOPY;  Service: Endoscopy;  Laterality: N/A;  . ESOPHAGOGASTRODUODENOSCOPY (EGD) WITH PROPOFOL N/A 05/24/2018   Procedure: ESOPHAGOGASTRODUODENOSCOPY (EGD) WITH PROPOFOL;  Surgeon: Lollie Sails, MD;  Location: [facility] ENDOSCOPY;  Service: Endoscopy;  Laterality: N/A;  . eye plugs Bilateral    plugs placed for dry eyes  . HALLUX VALGUS AUSTIN Left 08/11/2019   Procedure: AUSTIN (MITCHELL);  Surgeon: Albertine Patricia, DPM;  Location: [facility];  Service: Podiatry;  Laterality: Left;  . INDUCED ABORTION    . JOINT REPLACEMENT Right    hip  . ORIF TOE FRACTURE Left 09/22/2019   Procedure: REMOVAL OF SCREWS WITH OPEN REDUCTION INTERNAL FIXATION (ORIF) METATARSAL (GREAT TOE);  Surgeon: Albertine Patricia, DPM;  Location: [place];  Service: Podiatry;  Laterality: Left;  pt's splint was removed and cast was applied in PACU by MD  . PLANTAR FASCIA SURGERY Left 2005  . TOTAL HIP ARTHROPLASTY Right 08/11/2018   Procedure: TOTAL HIP ARTHROPLASTY;  Surgeon: Dereck Leep, MD;  Location: [facility];  Service: Orthopedics;  Laterality: Right;    Short Social History:  Social History   Tobacco Use  . Smoking status: Former Smoker    Packs/day: 0.50    Years: 10.00    Pack years: 5.00    Types: Cigarettes    Quit date: 07/22/2016     Years since quitting: 3.6  . Smokeless tobacco: Never Used  Substance Use Topics  . Alcohol use: Yes    Alcohol/week: 1.0 - 4.0 standard drink    Types: 1 - 2 Shots of liquor per week    Comment: occasional    No Known Allergies  Current Outpatient Medications  Medication Sig Dispense Refill  . budesonide-formoterol (SYMBICORT) 160-4.5 MCG/ACT inhaler Inhale 2 puffs into the lungs 2 (two) times daily. Am and lunch    . cholecalciferol (VITAMIN D3) 25 MCG (1000 UT) tablet Take 1,000 Units by mouth every 3 (three) days.    . clobetasol ointment (TEMOVATE) 0.05 % Apply vaginally nightly for 4 wks, then QOHS for 4 wks, then twice wkly as maintenance 45 g 1  . cyanocobalamin (,VITAMIN B-12,) 1000 MCG/ML injection Inject into the muscle every 30 (thirty) days.     . cyclobenzaprine (FLEXERIL) 10 MG tablet Take 10 mg by mouth daily as needed for muscle spasms.     . diclofenac (VOLTAREN) 75 MG EC tablet Take 1 tablet (75 mg total) by mouth 2 (two) times daily as needed. 60 tablet 2  . ferrous sulfate 325 (65 FE) MG tablet Take 325 mg by mouth 2 (two) times daily with a meal.   3  . gabapentin (NEURONTIN) 300 MG capsule TAKE 1 CAPSULE BY MOUTH THREE TIMES A DAY    . hydrochlorothiazide (HYDRODIURIL) 25 MG tablet Take 25 mg by mouth daily. am  11  . ibuprofen (ADVIL) 600 MG tablet Take 1 tablet (600 mg total) by mouth every 6 (six) hours as needed. 60 tablet 3  . lidocaine (XYLOCAINE) 5 % ointment Apply 1 application topically as needed. 35.44 g 0  . lovastatin (MEVACOR) 40 MG tablet Take 40 mg by mouth at bedtime.   3  . Melatonin 3 MG CAPS Take by mouth at bedtime.    Marland Kitchen oxyCODONE (ROXICODONE) 5 MG immediate release tablet Take 1 tablet (5 mg total) by mouth every 4 (four) hours as needed for severe pain. 30 tablet 0  . pantoprazole (PROTONIX) 40 MG tablet Take 40 mg by mouth daily. am  3  . polyethylene glycol (MIRALAX / GLYCOLAX) packet Take 17 g by mouth at bedtime.    . sucralfate (CARAFATE)  1 g tablet Take 1 g by mouth 2 (two) times daily.     . valACYclovir (VALTREX) 500 MG tablet Take 1 tablet (500 mg total) by mouth daily. 90 tablet 0  . DULoxetine (CYMBALTA) 30 MG capsule Take 1 capsule (30 mg total) by mouth daily. 30 capsule  2  . tacrolimus (PROTOPIC) 0.1 % ointment Apply topically 2 (two) times daily. 100 g 3   No current facility-administered medications for this visit.    Review of Systems  Constitutional: Negative for chills, fatigue, fever and unexpected weight change.  HENT: Negative for trouble swallowing.  Eyes: Negative for loss of vision.  Respiratory: Negative for cough, shortness of breath and wheezing.  Cardiovascular: Negative for chest pain, leg swelling, palpitations and syncope.  GI: Negative for abdominal pain, blood in stool, diarrhea, nausea and vomiting.  GU: Negative for difficulty urinating, dysuria, frequency and hematuria.  Musculoskeletal: Negative for back pain, leg pain and joint pain.  Skin: Negative for rash.  Neurological: Negative for dizziness, headaches, light-headedness, numbness and seizures.  Psychiatric: Negative for behavioral problem, confusion, depressed mood and sleep disturbance.        Objective:  Objective   Vitals:   03/05/20 1627  BP: 138/76  Weight: 260 lb (117.9 kg)   Body mass index is 39.53 kg/m.  Physical Exam Vitals and nursing note reviewed.  Constitutional:      Appearance: She is well-developed.  HENT:     Head: Normocephalic and atraumatic.  Eyes:     Pupils: Pupils are equal, round, and reactive to light.  Cardiovascular:     Rate and Rhythm: Normal rate and regular rhythm.  Pulmonary:     Effort: Pulmonary effort is normal. No respiratory distress.  Genitourinary:      Comments: Circled area shows area of skin changes consistent with lichen sclerosis Skin:    General: Skin is warm and dry.  Neurological:     Mental Status: She is alert and oriented to person, place, and time.    Psychiatric:        Behavior: Behavior normal.        Thought Content: Thought content normal.        Judgment: Judgment normal.         Assessment/Plan:     64 yo with severe lichen sclerosis, not responsive to a year of clobetasol use Discussed lichen sclerosis in detail with the patient. Have offered referral to specialist Dr. Oletta Lamas, but patient is not able to travel at this time for further treatment Will trial tacrolimus for lichen sclerosis- 12 week therapy, will follow up in 6-8 weeks. Encouraged patient to follow up sooner if she is not seeing an improvement in 4-6 weeks.  Discussed potential risks of lichen sclerosis to cause vulvar cancer. Recommended yearly visual inspection of the vulva for that reason.   More than 30 minutes were spent face to face with the patient in the room, reviewing the medical record, labs and images, and coordinating care for the patient. The plan of management was discussed in detail and counseling was provided.    Adrian Prows MD, [facility], [facility] 03/05/2020 5:40 PM

## 2020-03-06 ENCOUNTER — Other Ambulatory Visit: Payer: Self-pay

## 2020-03-06 DIAGNOSIS — A6004 Herpesviral vulvovaginitis: Secondary | ICD-10-CM

## 2020-03-06 MED ORDER — VALACYCLOVIR HCL 500 MG PO TABS: 500.0000 mg | ORAL_TABLET | Freq: Every day | ORAL | 0 refills | Status: DC

## 2020-04-04 ENCOUNTER — Other Ambulatory Visit: Payer: Self-pay | Admitting: Obstetrics and Gynecology

## 2020-04-09 ENCOUNTER — Other Ambulatory Visit: Payer: Self-pay | Admitting: Obstetrics and Gynecology

## 2020-04-09 ENCOUNTER — Encounter: Payer: BC Managed Care – PPO | Admitting: Student in an Organized Health Care Education/Training Program

## 2020-04-09 DIAGNOSIS — N9089 Other specified noninflammatory disorders of vulva and perineum: Secondary | ICD-10-CM

## 2020-04-09 MED ORDER — LIDOCAINE 5 % EX OINT: 1.0000 "application " | TOPICAL_OINTMENT | CUTANEOUS | 0 refills | Status: DC | PRN

## 2020-05-01 ENCOUNTER — Other Ambulatory Visit: Payer: Self-pay | Admitting: Student in an Organized Health Care Education/Training Program

## 2020-05-01 ENCOUNTER — Ambulatory Visit: Payer: BC Managed Care – PPO | Admitting: Obstetrics and Gynecology

## 2020-05-07 ENCOUNTER — Other Ambulatory Visit: Payer: Self-pay

## 2020-05-07 ENCOUNTER — Ambulatory Visit (INDEPENDENT_AMBULATORY_CARE_PROVIDER_SITE_OTHER): Payer: BC Managed Care – PPO | Admitting: Dermatology

## 2020-05-07 DIAGNOSIS — L900 Lichen sclerosus et atrophicus: Secondary | ICD-10-CM

## 2020-05-07 DIAGNOSIS — L720 Epidermal cyst: Secondary | ICD-10-CM

## 2020-05-07 DIAGNOSIS — Z1283 Encounter for screening for malignant neoplasm of skin: Secondary | ICD-10-CM

## 2020-05-07 DIAGNOSIS — I872 Venous insufficiency (chronic) (peripheral): Secondary | ICD-10-CM

## 2020-05-07 DIAGNOSIS — L578 Other skin changes due to chronic exposure to nonionizing radiation: Secondary | ICD-10-CM

## 2020-05-07 DIAGNOSIS — D1800 Hemangioma unspecified site: Secondary | ICD-10-CM

## 2020-05-07 DIAGNOSIS — L814 Other melanin hyperpigmentation: Secondary | ICD-10-CM

## 2020-05-07 DIAGNOSIS — L820 Inflamed seborrheic keratosis: Secondary | ICD-10-CM | POA: Diagnosis not present

## 2020-05-07 DIAGNOSIS — L821 Other seborrheic keratosis: Secondary | ICD-10-CM

## 2020-05-07 DIAGNOSIS — D229 Melanocytic nevi, unspecified: Secondary | ICD-10-CM

## 2020-05-07 MED ORDER — MOMETASONE FUROATE 0.1 % EX CREA: 1.0000 "application " | TOPICAL_CREAM | Freq: Every day | CUTANEOUS | 0 refills | Status: DC | PRN

## 2020-05-07 NOTE — Progress Notes (Signed)
   Follow-Up Visit   Subjective  Mackenzie [name] is a 64 y.o. female who presents for the following: TBSE. The patient presents for Total-Body Skin Exam (TBSE) for skin cancer screening and mole check. Patient presents today for annual TBSE, has some areas of concern on her Left chest, left thigh, and right forearm. Patient does not have h/o skin cancer  The following portions of the chart were reviewed this encounter and updated as appropriate:  Tobacco  Allergies  Meds  Problems  Med Hx  Surg Hx  Fam Hx     Review of Systems:  No other skin or systemic complaints except as noted in HPI or Assessment and Plan.  Objective  Well appearing patient in no apparent distress; mood and affect are within normal limits.  A full examination was performed including scalp, head, eyes, ears, nose, lips, neck, chest, axillae, abdomen, back, buttocks, bilateral upper extremities, bilateral lower extremities, hands, feet, fingers, toes, fingernails, and toenails. All findings within normal limits unless otherwise noted below.  Objective  Right Lower Leg - Anterior: Erythematous, scaly patches involving the ankle and distal lower leg with associated lower leg edema.   Images      Objective  Left Eyebrow: Erythematous keratotic or waxy stuck-on papule or plaque.   Objective  Left medial clavicle: Smooth white papule(s).   Objective  Right Supra Thigh:  6 x 2.5 cmLichenified plaque  Images       Assessment & Plan  Venous stasis dermatitis of right lower extremity Right Lower Leg - Anterior May also have early Disseminated Superficial Actinic Porokeratosis (DSAP) of the lower leg.  Start Mometasone cream mometasone (ELOCON) 0.1 % cream - Right Lower Leg - Anterior  Inflamed seborrheic keratosis Left Eyebrow Discussed cryotherapy but patient will declines at this time  Milia Left medial clavicle Benign, observe.   Lichen sclerosus Above Right Thigh in groin Continue  clobetasol ointment as prescribed. She also has Lichen Sclerosus of genital area - biopsy proven by her GYN. I reviewed pathology. Apparently, GYN wants to send her to [facility] for consultation for her Lichen Sclerosus, but pt could not go at this time pending a hip replacement.   Lentigines - Scattered tan macules - Discussed due to sun exposure - Benign, observe - Call for any changes  Seborrheic Keratoses - Stuck-on, waxy, tan-brown papules and plaques  - Discussed benign etiology and prognosis. - Observe - Call for any changes  Melanocytic Nevi - Tan-brown and/or pink-flesh-colored symmetric macules and papules - Benign appearing on exam today - Observation - Call clinic for new or changing moles - Recommend daily use of broad spectrum spf 30+ sunscreen to sun-exposed areas.   Hemangiomas - Red papules - Discussed benign nature - Observe - Call for any changes  Actinic Damage - diffuse scaly erythematous macules with underlying dyspigmentation - Recommend daily broad spectrum sunscreen SPF 30+ to sun-exposed areas, reapply every 2 hours as needed.  - Call for new or changing lesions.  Skin cancer screening performed today.  Return in about 6 months (around 11/05/2020) for Stasis Derm.   Documentation: I have reviewed the above documentation for accuracy and completeness, and I agree with the above.  Sarina Ser, MD

## 2020-05-07 NOTE — Patient Instructions (Signed)
Recommend daily broad spectrum sunscreen SPF 30+ to sun-exposed areas, reapply every 2 hours as needed. Call for new or changing lesions.  

## 2020-05-12 ENCOUNTER — Encounter: Payer: Self-pay | Admitting: Dermatology

## 2020-05-19 ENCOUNTER — Other Ambulatory Visit: Payer: Self-pay | Admitting: Student in an Organized Health Care Education/Training Program

## 2020-05-19 DIAGNOSIS — G894 Chronic pain syndrome: Secondary | ICD-10-CM

## 2020-05-22 ENCOUNTER — Ambulatory Visit (INDEPENDENT_AMBULATORY_CARE_PROVIDER_SITE_OTHER): Payer: BC Managed Care – PPO | Admitting: Obstetrics and Gynecology

## 2020-05-22 ENCOUNTER — Encounter: Payer: Self-pay | Admitting: Obstetrics and Gynecology

## 2020-05-22 ENCOUNTER — Other Ambulatory Visit: Payer: Self-pay

## 2020-05-22 VITALS — BP 115/72 | Ht 68.0 in | Wt 263.6 lb

## 2020-05-22 DIAGNOSIS — N9089 Other specified noninflammatory disorders of vulva and perineum: Secondary | ICD-10-CM

## 2020-05-22 DIAGNOSIS — L900 Lichen sclerosus et atrophicus: Secondary | ICD-10-CM

## 2020-05-22 DIAGNOSIS — A6004 Herpesviral vulvovaginitis: Secondary | ICD-10-CM | POA: Diagnosis not present

## 2020-05-22 MED ORDER — LIDOCAINE 5 % EX OINT: 1.0000 "application " | TOPICAL_OINTMENT | Freq: Two times a day (BID) | CUTANEOUS | 5 refills | Status: DC | PRN

## 2020-05-22 MED ORDER — VALACYCLOVIR HCL 1 G PO TABS: 1000.0000 mg | ORAL_TABLET | Freq: Every day | ORAL | 4 refills | Status: DC

## 2020-05-22 MED ORDER — CLOBETASOL PROPIONATE 0.05 % EX OINT: TOPICAL_OINTMENT | CUTANEOUS | 5 refills | Status: DC

## 2020-05-22 NOTE — Progress Notes (Signed)
Patient ID: Mackenzie [name], female   DOB: [birth year], 64 y.o.   MRN: [id number]  Reason for Consult: Gynecologic Exam   Referred by Idelle Crouch, MD  Subjective:     HPI:  Mackenzie [name] is a 64 y.o. female. She is following up for monitoring of lichen sclerosis. She had an erosive lesion. She tried tacrolimus but it gave her the sensation of her skin crawling with ants. Lidocaine helps the most.  She applies clobetasol throughout the day.    Past Medical History:  Diagnosis Date  . ADD (attention deficit disorder)   . Anemia    iron deficiency, b12, taking iron  . Anxiety   . Barrett's esophagus 2019  . Cold sore   . Colon polyps   . COPD (chronic obstructive pulmonary disease) ([place])   . Diverticulosis   . Dyspnea    on exertion  . Environmental allergies   . Essential hypertension   . Genital herpes 05/2019   neg culture, positive type 2 IgG.  . GERD (gastroesophageal reflux disease)   . History of hiatal hernia   . History of nerve impingement    Sees Dr. Holley Raring, Pain Management  . Hyperlipidemia   . Lichen sclerosus   . Obesity   . Osteoarthritis    hands, feet, knees  . Other abnormal auditory perceptions, bilateral 2019   hip and back issues  . RLS (restless legs syndrome)   . Shingles 09/2018  . Sleep apnea    CPAP  . Vitamin B12 deficiency   . Weakness of back    right side back and leg nerves burned / now burning left side  . Wears contact lenses    Family History  Problem Relation Age of Onset  . Breast cancer Cousin 24       mat second cousin  . Liver cancer Maternal Aunt   . Alcoholism Mother   . Hypertension Mother   . Diabetes Mother        type 1  . Congestive Heart Failure Mother   . Obesity Mother   . Diabetes Maternal Grandmother        type 2  . Breast cancer Paternal Grandmother 63  . Arthritis Sister   . Diabetes Sister   . Stroke Brother 28  . Alcoholism Maternal Grandfather    Past Surgical History:  Procedure Laterality  Date  . Barbie Banner OSTEOTOMY Left 08/11/2019   Procedure: PHALANX OSTEOTOMY AKIN;  Surgeon: Albertine Patricia, DPM;  Location: [place];  Service: Podiatry;  Laterality: Left;  . BONE EXCISION Left 08/11/2019   Procedure: SADDLEBONE;  Surgeon: Albertine Patricia, DPM;  Location: [place];  Service: Podiatry;  Laterality: Left;  sleep apnea  . BREAST CYST ASPIRATION Right 1998   neg  . broken leg repair Right   . BUNIONECTOMY Right 2010   pins in toes of right foot  . COLONOSCOPY  2014  . COLONOSCOPY WITH PROPOFOL N/A 05/24/2018   Procedure: COLONOSCOPY WITH PROPOFOL;  Surgeon: Lollie Sails, MD;  Location: [facility] ENDOSCOPY;  Service: Endoscopy;  Laterality: N/A;  . ESOPHAGOGASTRODUODENOSCOPY (EGD) WITH PROPOFOL N/A 05/24/2018   Procedure: ESOPHAGOGASTRODUODENOSCOPY (EGD) WITH PROPOFOL;  Surgeon: Lollie Sails, MD;  Location: [facility] ENDOSCOPY;  Service: Endoscopy;  Laterality: N/A;  . eye plugs Bilateral    plugs placed for dry eyes  . HALLUX VALGUS AUSTIN Left 08/11/2019   Procedure: AUSTIN (MITCHELL);  Surgeon: Albertine Patricia, DPM;  Location: [place];  Service:  Podiatry;  Laterality: Left;  . INDUCED ABORTION    . JOINT REPLACEMENT Right    hip  . ORIF TOE FRACTURE Left 09/22/2019   Procedure: REMOVAL OF SCREWS WITH OPEN REDUCTION INTERNAL FIXATION (ORIF) METATARSAL (GREAT TOE);  Surgeon: Albertine Patricia, DPM;  Location: [place];  Service: Podiatry;  Laterality: Left;  pt's splint was removed and cast was applied in PACU by MD  . PLANTAR FASCIA SURGERY Left 2005  . TOTAL HIP ARTHROPLASTY Right 08/11/2018   Procedure: TOTAL HIP ARTHROPLASTY;  Surgeon: Dereck Leep, MD;  Location: [facility] ORS;  Service: Orthopedics;  Laterality: Right;    Short Social History:  Social History   Tobacco Use  . Smoking status: Former Smoker    Packs/day: 0.50    Years: 10.00    Pack years: 5.00    Types: Cigarettes    Quit date: 07/22/2016    Years since  quitting: 3.8  . Smokeless tobacco: Never Used  Substance Use Topics  . Alcohol use: Yes    Alcohol/week: 1.0 - 4.0 standard drink    Types: 1 - 2 Shots of liquor per week    Comment: occasional    No Known Allergies  Current Outpatient Medications  Medication Sig Dispense Refill  . ALPRAZolam (XANAX) 1 MG tablet Take by mouth.    . budesonide-formoterol (SYMBICORT) 160-4.5 MCG/ACT inhaler Inhale 2 puffs into the lungs 2 (two) times daily. Am and lunch    . cholecalciferol (VITAMIN D3) 25 MCG (1000 UT) tablet Take 1,000 Units by mouth every 3 (three) days.    . clobetasol ointment (TEMOVATE) 0.05 % Apply vaginally nightly for 4 wks, then QOHS for 4 wks, then twice wkly as maintenance 60 g 5  . cyanocobalamin (,VITAMIN B-12,) 1000 MCG/ML injection Inject into the muscle every 30 (thirty) days.     . cyclobenzaprine (FLEXERIL) 10 MG tablet Take 10 mg by mouth daily as needed for muscle spasms.     . diclofenac (VOLTAREN) 75 MG EC tablet Take 1 tablet (75 mg total) by mouth 2 (two) times daily as needed. 60 tablet 2  . ferrous sulfate 325 (65 FE) MG tablet Take 325 mg by mouth 2 (two) times daily with a meal.   3  . gabapentin (NEURONTIN) 300 MG capsule TAKE 1 CAPSULE BY MOUTH THREE TIMES A DAY    . hydrochlorothiazide (HYDRODIURIL) 25 MG tablet Take 25 mg by mouth daily. am  11  . ibuprofen (ADVIL) 600 MG tablet Take 1 tablet (600 mg total) by mouth every 6 (six) hours as needed. 60 tablet 3  . lidocaine (XYLOCAINE) 5 % ointment Apply 1 application topically 2 (two) times daily as needed. 150 g 5  . lovastatin (MEVACOR) 40 MG tablet Take 40 mg by mouth at bedtime.   3  . Melatonin 3 MG CAPS Take by mouth at bedtime.    . mometasone (ELOCON) 0.1 % cream Apply 1 application topically daily as needed (Rash). 45 g 0  . pantoprazole (PROTONIX) 40 MG tablet Take 40 mg by mouth daily. am  3  . polyethylene glycol (MIRALAX / GLYCOLAX) packet Take 17 g by mouth at bedtime.    . sucralfate  (CARAFATE) 1 g tablet Take 1 g by mouth 2 (two) times daily.     . tacrolimus (PROTOPIC) 0.1 % ointment Apply topically 2 (two) times daily. 100 g 3  . DULoxetine (CYMBALTA) 30 MG capsule Take 1 capsule (30 mg total) by mouth daily. 30 capsule 2  .  valACYclovir (VALTREX) 1000 MG tablet Take 1 tablet (1,000 mg total) by mouth daily for 5 days. 90 tablet 4   No current facility-administered medications for this visit.    REVIEW OF SYSTEMS      Objective:  Objective   Vitals:   05/22/20 0808  BP: 115/72  Weight: 263 lb 9.6 oz (119.6 kg)  Height: 5\' 8"  (1.727 m)   Body mass index is 40.08 kg/m.  Physical Exam Exam conducted with a chaperone present.  Genitourinary:       Assessment/Plan:      64 yo with lichen sclerosis Improved appearance of vulva Continue with daily application of clobetasol Lidocaine as needed Increase valtrex to 1g a day  More than 25 minutes were spent face to face with the patient in the room, reviewing the medical record, labs and images, and coordinating care for the patient. The plan of management was discussed in detail and counseling was provided.   Follow up in 3 months    [place] 05/22/2020 8:43 AM

## 2020-06-12 ENCOUNTER — Other Ambulatory Visit: Payer: Self-pay | Admitting: Obstetrics and Gynecology

## 2020-06-12 DIAGNOSIS — A6004 Herpesviral vulvovaginitis: Secondary | ICD-10-CM

## 2020-06-14 NOTE — Telephone Encounter (Signed)
See refill

## 2020-07-16 IMAGING — MR MR HIP*R* W/O CM
5 of 6 series · 33 of 40 positions shown · non-contrast
Comparison: Right hip x-rays dated March 30, 2018.

CLINICAL DATA: Chronic right hip and groin pain for the past year.

EXAM:
MR OF THE RIGHT HIP WITHOUT CONTRAST
TECHNIQUE: Multiplanar, multisequence MR imaging was performed. No intravenous
contrast was administered.

[Series 4: T2 fat-sat · coronal · 4.0mm · 0.74mm/px · 7 of 24 slices shown (1 of 2)]
[im 1/24]
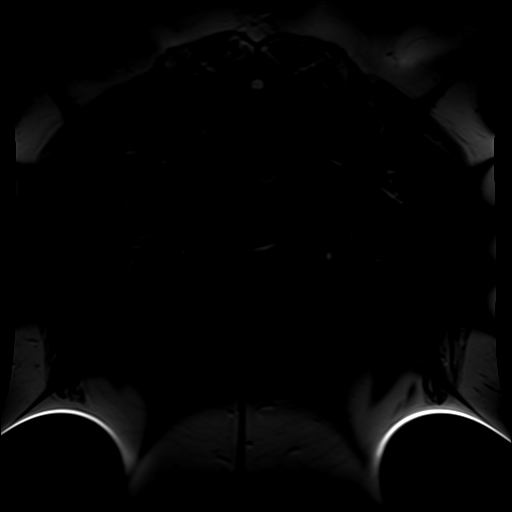
[im 4/24]
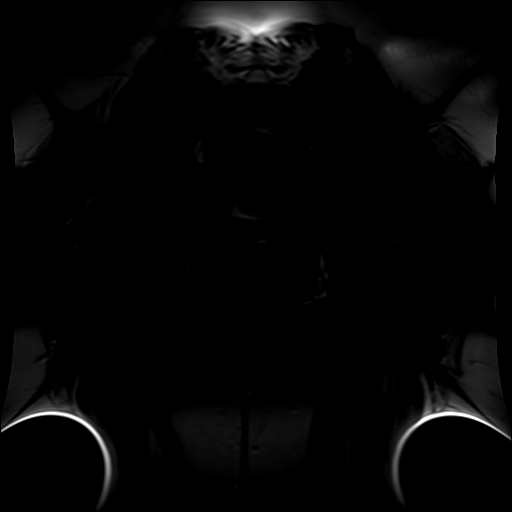
[im 8/24]
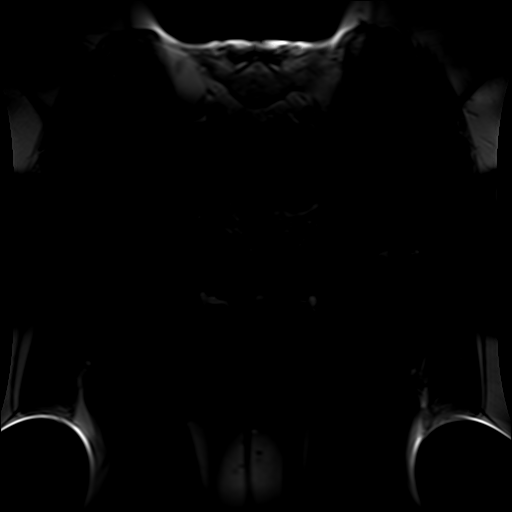
[im 12/24]
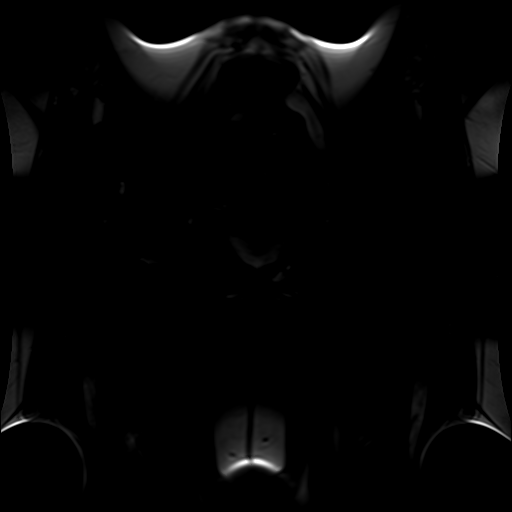
[im 16/24]
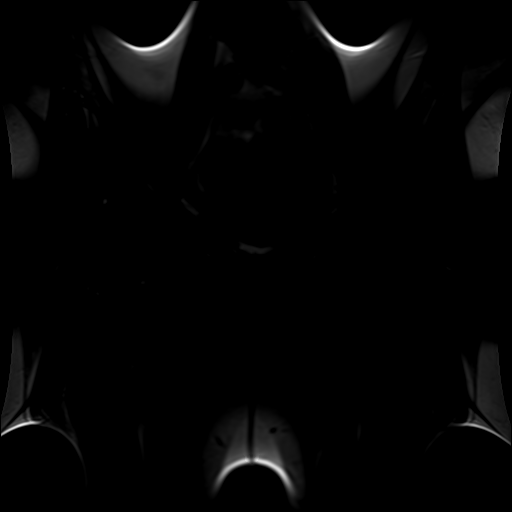
[im 20/24]
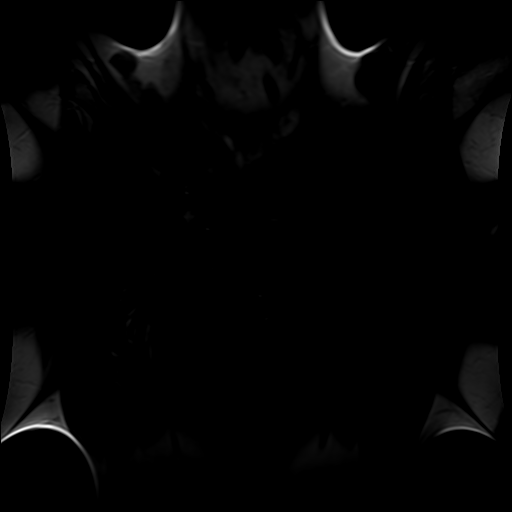
[im 24/24]
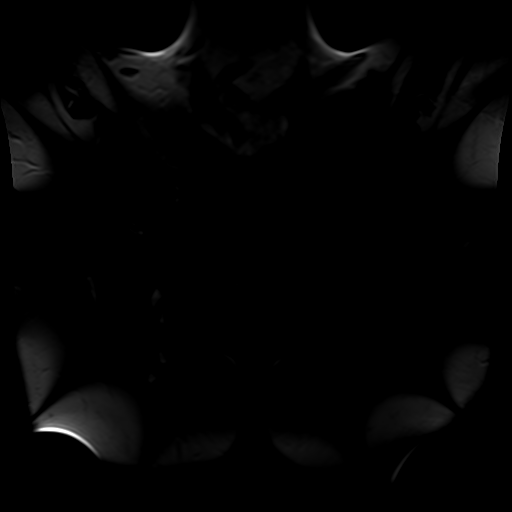

[Series 6: PD fat-sat · sagittal · 4.0mm · 0.70mm/px · 7 of 24 slices shown (1 of 3)]
[im 1/24]
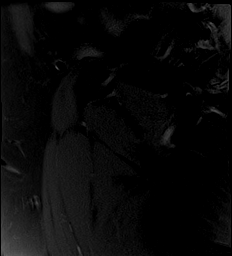
[im 4/24]
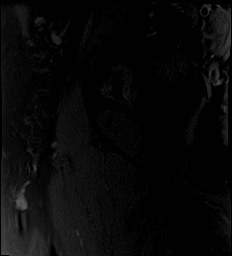
[im 8/24]
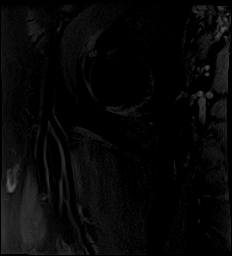
[im 12/24]
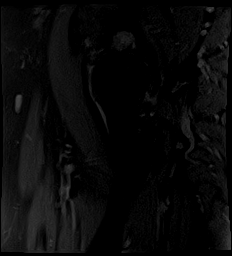
[im 16/24]
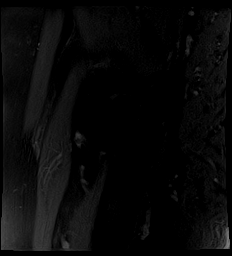
[im 20/24]
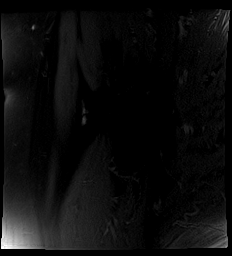
[im 24/24]
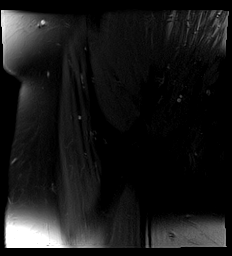

[Series 7: PD fat-sat · coronal · 4.0mm · 0.70mm/px · 6 of 19 slices shown (2 of 3)]
[im 1/19]
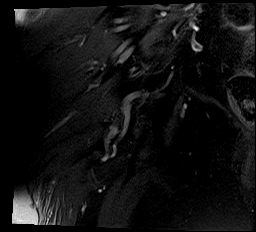
[im 4/19]
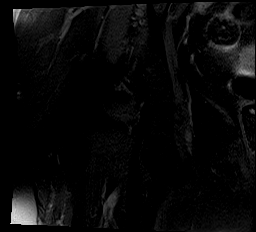
[im 8/19]
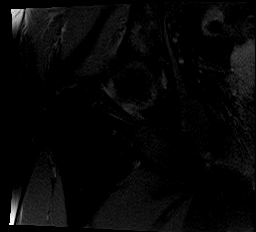
[im 11/19]
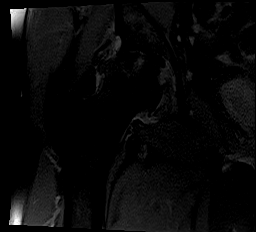
[im 15/19]
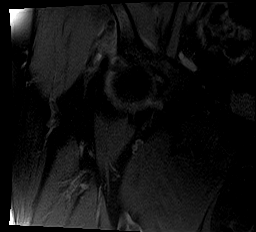
[im 19/19]
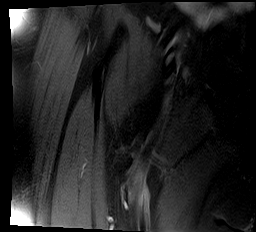

[Series 8: T2 fat-sat · axial · 4.0mm · 0.35mm/px · z∈[-53,+62]mm · 7 of 24 slices shown (2 of 2)]
[im 1/24]
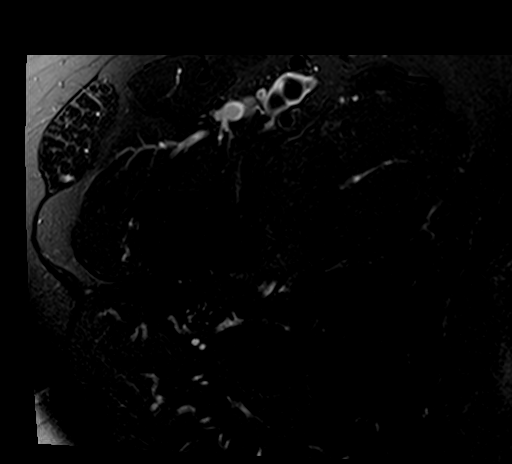
[im 4/24]
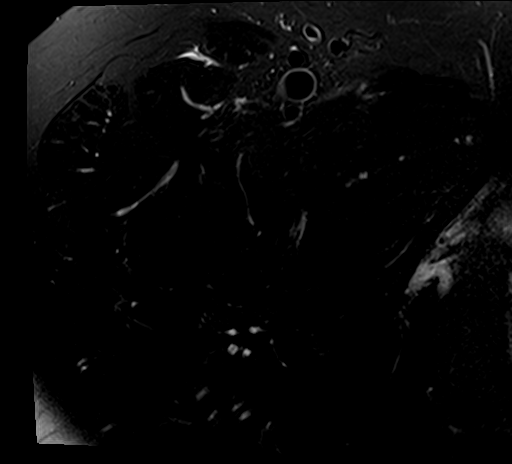
[im 8/24]
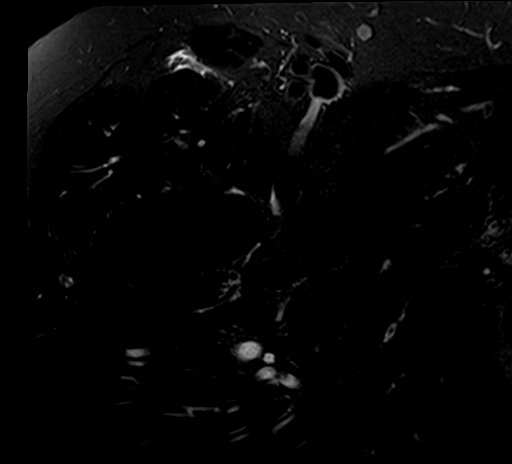
[im 12/24]
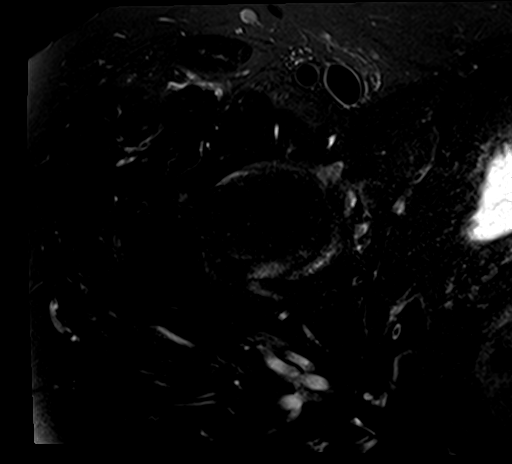
[im 16/24]
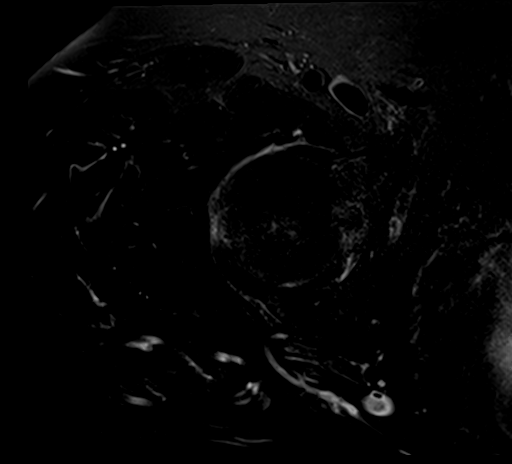
[im 20/24]
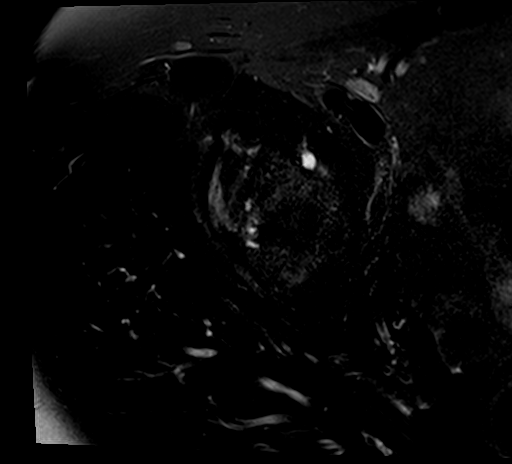
[im 24/24]
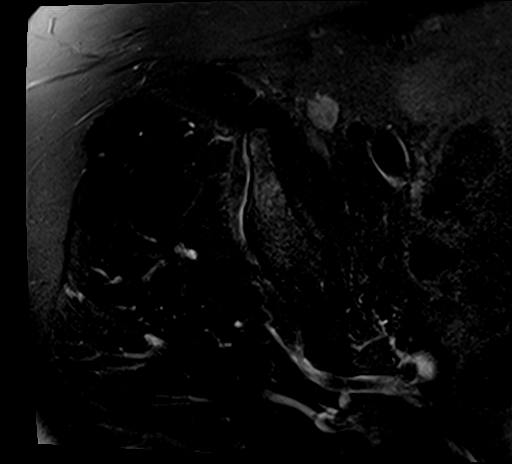

[Series 9: PD fat-sat · oblique · 4.0mm · 0.70mm/px · 6 of 20 slices shown (3 of 3)]
[im 1/20]
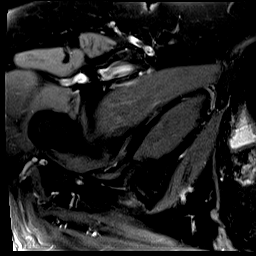
[im 4/20]
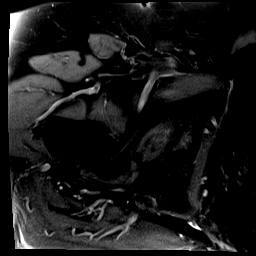
[im 8/20]
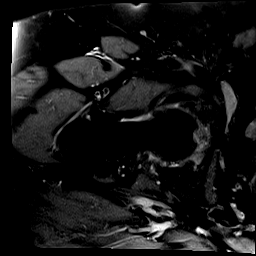
[im 12/20]
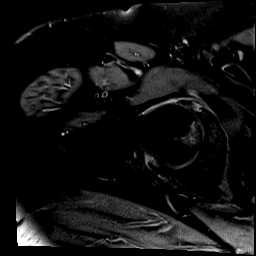
[im 16/20]
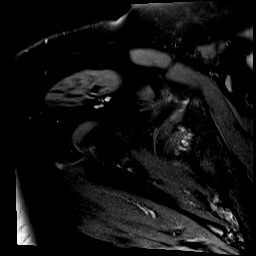
[im 20/20]
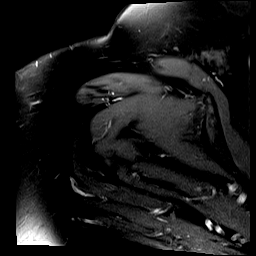

[33 of 40 positions shown; findings below may reference images not displayed]

FINDINGS: Bones: There is no evidence of acute fracture, dislocation or
avascular necrosis. No focal bone lesion. The visualized sacroiliac
joints and symphysis pubis appear normal.

Articular cartilage and labrum

Articular cartilage: Large areas of full-thickness cartilage loss
along the superior humeral head and acetabulum with underlying
subchondral marrow edema.

Labrum: Tearing of the superior and anterior superior labrum at the
chondrolabral junction.

Joint or bursal effusion

Joint effusion: No significant hip joint effusion.

Bursae: No focal periarticular fluid collection.

Muscles and tendons

Muscles and tendons: The visualized gluteus, hamstring and iliopsoas
tendons appear normal. No muscle edema or atrophy.

Other findings

Miscellaneous: Sigmoid diverticulosis. The visualized internal
pelvic contents otherwise appear unremarkable.
IMPRESSION: 1. Moderate to severe right hip osteoarthritis.
2. Anterior superior and superior labral tear at the chondrolabral
junction.

## 2020-08-06 ENCOUNTER — Other Ambulatory Visit: Payer: Self-pay | Admitting: Obstetrics and Gynecology

## 2020-08-06 DIAGNOSIS — A6004 Herpesviral vulvovaginitis: Secondary | ICD-10-CM

## 2020-08-07 NOTE — Telephone Encounter (Signed)
Please see the RX Request

## 2020-08-21 ENCOUNTER — Ambulatory Visit (INDEPENDENT_AMBULATORY_CARE_PROVIDER_SITE_OTHER): Payer: BC Managed Care – PPO | Admitting: Obstetrics and Gynecology

## 2020-08-21 ENCOUNTER — Encounter: Payer: Self-pay | Admitting: Obstetrics and Gynecology

## 2020-08-21 ENCOUNTER — Other Ambulatory Visit: Payer: Self-pay

## 2020-08-21 VITALS — BP 120/70 | Ht 68.0 in | Wt 259.8 lb

## 2020-08-21 DIAGNOSIS — N904 Leukoplakia of vulva: Secondary | ICD-10-CM

## 2020-08-21 DIAGNOSIS — N9089 Other specified noninflammatory disorders of vulva and perineum: Secondary | ICD-10-CM | POA: Diagnosis not present

## 2020-08-21 DIAGNOSIS — L900 Lichen sclerosus et atrophicus: Secondary | ICD-10-CM

## 2020-08-21 MED ORDER — CLOBETASOL PROPIONATE 0.05 % EX OINT: TOPICAL_OINTMENT | CUTANEOUS | 5 refills | Status: DC

## 2020-08-21 MED ORDER — LIDOCAINE 5 % EX OINT: 1.0000 "application " | TOPICAL_OINTMENT | Freq: Two times a day (BID) | CUTANEOUS | 5 refills | Status: DC | PRN

## 2020-08-21 NOTE — Patient Instructions (Signed)
Lichen Sclerosus Lichen sclerosus is a skin problem. It can happen on any part of the body, but it commonly involves the anal or genital areas. It can cause itching and discomfort in these areas. Treatment can help to control symptoms. When the genital area is affected, getting treatment is important because the condition can cause scarring that may lead to other problems. What are the causes? The cause of this condition is not known. It may be related to an overactive immune system or a lack of certain hormones. Lichen sclerosus is not an infection or a fungus, and it is not passed from one person to another (not contagious). What increases the risk? This condition is more likely to develop in women, usually after menopause. What are the signs or symptoms? Symptoms of this condition include:  Thin, wrinkled, white areas on the skin.  Thickened white areas on the skin.  Red and swollen patches (lesions) on the skin.  Tears or cracks in the skin.  Bruising.  Blood blisters.  Severe itching.  Pain, itching, or burning when urinating. Constipation is also common in people with lichen sclerosus. How is this diagnosed? This condition may be diagnosed with a physical exam. In some cases, a tissue sample (biopsy sample) may be removed to be looked at under a microscope. How is this treated? This condition is usually treated with medicated creams or ointments (topical steroids) that are applied over the affected areas. In some cases, treatment may also include medicines that are taken by mouth. Surgery may be needed in more severe cases that are causing problems such as scarring. Follow these instructions at home:  Take or use over-the-counter and prescription medicines only as told by your health care provider.  Use creams or ointments as told by your health care provider.  Do not scratch the affected areas of skin.  If you are a woman, be sure to keep the vaginal area as clean and dry  as possible.  Clean the affected area of skin gently with water. Avoid using rough towels or toilet paper.  Keep all follow-up visits as told by your health care provider. This is important. Contact a health care provider if:  You have increasing redness, swelling, or pain in the affected area.  You have fluid, blood, or pus coming from the affected area.  You have new lesions on your skin.  You have a fever.  You have pain during sex. Summary  Lichen sclerosus is a skin problem. When the genital area is affected, getting treatment is important because the condition can cause scarring that may lead to other problems.  This condition is usually treated with medicated creams or ointments (topical steroids) that are applied over the affected areas.  Take or use over-the-counter and prescription medicines only as told by your health care provider.  Contact a health care provider if you have new lesions on your skin, have pain during sex, or have increasing redness, swelling, or pain in the affected area.  Keep all follow-up visits as told by your health care provider. This is important. This information is not intended to replace advice given to you by your health care provider. Make sure you discuss any questions you have with your health care provider. Document Revised: 01/07/2018 Document Reviewed: 01/07/2018 Elsevier Patient Education  2020 Elsevier Inc.  

## 2020-08-21 NOTE — Progress Notes (Signed)
Patient ID: Mackenzie [name], female   DOB: 1956-04-24, 64 y.o.   MRN: [id number]  Reason for Consult: Follow-up (Lichen Sclerousus)   Referred by Idelle Crouch, MD  Subjective:     HPI:  Mackenzie [name] is a 64 y.o. female.  She reports that she has noticed a waxing and waning of her symptoms of lichen sclerosis.  She reports that she will have some days where the lesion has gone away and her pain is resolving then other days when the lesion is coming back.  She reports that she currently feels like the erosive lesion is returning.  She continues to use the clobetasol once a day.  She also uses lidocaine as needed for pain.   Past Medical History:  Diagnosis Date  . ADD (attention deficit disorder)   . Anemia    iron deficiency, b12, taking iron  . Anxiety   . Barrett's esophagus 2019  . Cold sore   . Colon polyps   . COPD (chronic obstructive pulmonary disease) ([place])   . Diverticulosis   . Dyspnea    on exertion  . Environmental allergies   . Essential hypertension   . Genital herpes 05/2019   neg culture, positive type 2 IgG.  . GERD (gastroesophageal reflux disease)   . History of hiatal hernia   . History of nerve impingement    Sees Dr. Holley Raring, Pain Management  . Hyperlipidemia   . Lichen sclerosus   . Obesity   . Osteoarthritis    hands, feet, knees  . Other abnormal auditory perceptions, bilateral 2019   hip and back issues  . RLS (restless legs syndrome)   . Shingles 09/2018  . Sleep apnea    CPAP  . Vitamin B12 deficiency   . Weakness of back    right side back and leg nerves burned / now burning left side  . Wears contact lenses    Family History  Problem Relation Age of Onset  . Breast cancer Cousin 68       mat second cousin  . Liver cancer Maternal Aunt   . Alcoholism Mother   . Hypertension Mother   . Diabetes Mother        type 1  . Congestive Heart Failure Mother   . Obesity Mother   . Diabetes Maternal Grandmother        type 2  .  Breast cancer Paternal Grandmother 61  . Arthritis Sister   . Diabetes Sister   . Stroke Brother 37  . Alcoholism Maternal Grandfather    Past Surgical History:  Procedure Laterality Date  . Barbie Banner OSTEOTOMY Left 08/11/2019   Procedure: PHALANX OSTEOTOMY AKIN;  Surgeon: Albertine Patricia, DPM;  Location: [place];  Service: Podiatry;  Laterality: Left;  . BONE EXCISION Left 08/11/2019   Procedure: SADDLEBONE;  Surgeon: Albertine Patricia, DPM;  Location: [place];  Service: Podiatry;  Laterality: Left;  sleep apnea  . BREAST CYST ASPIRATION Right 1998   neg  . broken leg repair Right   . BUNIONECTOMY Right 2010   pins in toes of right foot  . COLONOSCOPY  2014  . COLONOSCOPY WITH PROPOFOL N/A 05/24/2018   Procedure: COLONOSCOPY WITH PROPOFOL;  Surgeon: Lollie Sails, MD;  Location: [facility]COPY;  Service: Endoscopy;  Laterality: N/A;  . ESOPHAGOGASTRODUODENOSCOPY (EGD) WITH PROPOFOL N/A 05/24/2018   Procedure: ESOPHAGOGASTRODUODENOSCOPY (EGD) WITH PROPOFOL;  Surgeon: Lollie Sails, MD;  Location: [facility] ENDOSCOPY;  Service: Endoscopy;  Laterality:  N/A;  . eye plugs Bilateral    plugs placed for dry eyes  . HALLUX VALGUS AUSTIN Left 08/11/2019   Procedure: AUSTIN (MITCHELL);  Surgeon: Albertine Patricia, DPM;  Location: [place];  Service: Podiatry;  Laterality: Left;  . INDUCED ABORTION    . JOINT REPLACEMENT Right    hip  . ORIF TOE FRACTURE Left 09/22/2019   Procedure: REMOVAL OF SCREWS WITH OPEN REDUCTION INTERNAL FIXATION (ORIF) METATARSAL (GREAT TOE);  Surgeon: Albertine Patricia, DPM;  Location: [place];  Service: Podiatry;  Laterality: Left;  pt's splint was removed and cast was applied in PACU by MD  . PLANTAR FASCIA SURGERY Left 2005  . TOTAL HIP ARTHROPLASTY Right 08/11/2018   Procedure: TOTAL HIP ARTHROPLASTY;  Surgeon: Dereck Leep, MD;  Location: [facility];  Service: Orthopedics;  Laterality: Right;    Short Social History:   Social History   Tobacco Use  . Smoking status: Former Smoker    Packs/day: 0.50    Years: 10.00    Pack years: 5.00    Types: Cigarettes    Quit date: 07/22/2016    Years since quitting: 4.0  . Smokeless tobacco: Never Used  Substance Use Topics  . Alcohol use: Yes    Alcohol/week: 1.0 - 4.0 standard drink    Types: 1 - 2 Shots of liquor per week    Comment: occasional    No Known Allergies  Current Outpatient Medications  Medication Sig Dispense Refill  . ALPRAZolam (XANAX) 1 MG tablet Take by mouth.    . budesonide-formoterol (SYMBICORT) 160-4.5 MCG/ACT inhaler Inhale 2 puffs into the lungs 2 (two) times daily. Am and lunch    . cholecalciferol (VITAMIN D3) 25 MCG (1000 UT) tablet Take 1,000 Units by mouth every 3 (three) days.    . clobetasol ointment (TEMOVATE) 0.05 % Apply vaginally twice a night for next 8 weeks 60 g 5  . cyanocobalamin (,VITAMIN B-12,) 1000 MCG/ML injection Inject into the muscle every 30 (thirty) days.     . cyclobenzaprine (FLEXERIL) 10 MG tablet Take 10 mg by mouth daily as needed for muscle spasms.     . diclofenac (VOLTAREN) 75 MG EC tablet Take 1 tablet (75 mg total) by mouth 2 (two) times daily as needed. 60 tablet 2  . DULoxetine (CYMBALTA) 30 MG capsule Take 1 capsule (30 mg total) by mouth daily. 30 capsule 2  . ferrous sulfate 325 (65 FE) MG tablet Take 325 mg by mouth 2 (two) times daily with a meal.   3  . gabapentin (NEURONTIN) 300 MG capsule TAKE 1 CAPSULE BY MOUTH THREE TIMES A DAY    . hydrochlorothiazide (HYDRODIURIL) 25 MG tablet Take 25 mg by mouth daily. am  11  . ibuprofen (ADVIL) 600 MG tablet Take 1 tablet (600 mg total) by mouth every 6 (six) hours as needed. 60 tablet 3  . lidocaine (XYLOCAINE) 5 % ointment Apply 1 application topically 2 (two) times daily as needed. 150 g 5  . lovastatin (MEVACOR) 40 MG tablet Take 40 mg by mouth at bedtime.  (Patient not taking: Reported on 08/21/2020)  3  . Melatonin 3 MG CAPS Take by mouth  at bedtime.    . mometasone (ELOCON) 0.1 % cream Apply 1 application topically daily as needed (Rash). 45 g 0  . pantoprazole (PROTONIX) 40 MG tablet Take 40 mg by mouth daily. am  3  . polyethylene glycol (MIRALAX / GLYCOLAX) packet Take 17 g by mouth at bedtime.    Marland Kitchen  sucralfate (CARAFATE) 1 g tablet Take 1 g by mouth 2 (two) times daily.     . tacrolimus (PROTOPIC) 0.1 % ointment Apply topically 2 (two) times daily. 100 g 3  . valACYclovir (VALTREX) 500 MG tablet TAKE 1 TABLET BY MOUTH EVERY DAY 90 tablet 0   No current facility-administered medications for this visit.    Review of Systems  Constitutional: Negative for chills, fatigue, fever and unexpected weight change.  HENT: Negative for trouble swallowing.  Eyes: Negative for loss of vision.  Respiratory: Negative for cough, shortness of breath and wheezing.  Cardiovascular: Negative for chest pain, leg swelling, palpitations and syncope.  GI: Negative for abdominal pain, blood in stool, diarrhea, nausea and vomiting.  GU: Negative for difficulty urinating, dysuria, frequency and hematuria.  Musculoskeletal: Negative for back pain, leg pain and joint pain.  Skin: Negative for rash.  Neurological: Negative for dizziness, headaches, light-headedness, numbness and seizures.  Psychiatric: Negative for behavioral problem, confusion, depressed mood and sleep disturbance.        Objective:  Objective   Vitals:   08/21/20 0810  BP: 120/70  Weight: 259 lb 12.8 oz (117.8 kg)  Height: 5\' 8"  (1.727 m)   Body mass index is 39.5 kg/m.  Physical Exam Vitals and nursing note reviewed. Exam conducted with a chaperone present.  Constitutional:      Appearance: She is well-developed and well-nourished.  HENT:     Head: Normocephalic and atraumatic.  Eyes:     Extraocular Movements: EOM normal.     Pupils: Pupils are equal, round, and reactive to light.  Cardiovascular:     Rate and Rhythm: Normal rate and regular rhythm.   Pulmonary:     Effort: Pulmonary effort is normal. No respiratory distress.  Genitourinary:    Comments: Lesion inside hymenal ring at 1 o'clock position has returned.  Not as severely erosive as previously. Photo taken. Skin:    General: Skin is warm and dry.  Neurological:     Mental Status: She is alert and oriented to person, place, and time.  Psychiatric:        Mood and Affect: Mood and affect normal.        Behavior: Behavior normal.        Thought Content: Thought content normal.        Judgment: Judgment normal.       Assessment/Plan:    64 year old with biopsy-proven lichen sclerosis. Discussed with patient that she is a difficult case of recurring lichen sclerosis which has been challenging to treat.  She has trialed immune modulating therapies in the past but did not tolerate these medications.  She was having some initial improvement with the clobetasol but now it appears that her symptoms are continuing to wax and wane.  She is open to trying an intralesional injection of steroids.  She will follow-up to do this in the office in approximately 4 weeks.  Lesion today is not as aggressively erosive as it has appeared in the past and I would therefore classify it as moderate.  However at her last visit approximately 2 months ago the lesion had completely resolved.  Photo was taken for the medical log today.  We discussed referral to dermatology including the vulvar dermatologist Dr. Daniel Nones in [place].  Patient currently does not feel like she can travel to [place] for consultation because she has continued ongoing issues with her legs and hip and back.  I recommended considering scheduling an appointment for a few  months from now so that patient has the option of pursuing treatment with them if needed.  Discussed that maybe they would do a phone consultation.  We discussed the possibility of repeating a biopsy if in the future the lesion continues to grow and change.   Patient was very uncomfortable with the previous biopsy and feels like she is not mentally recovered from that experience yet.  She understands that she does have a risk of vulvar cancer related to the lichen sclerosus and the persistence of symptoms.  We can consider biopsy in the OR in the future if necessary.  More than 25 minutes were spent face to face with the patient in the room, reviewing the medical record, labs and images, and coordinating care for the patient. The plan of management was discussed in detail and counseling was provided.     Adrian Prows MD [facility], [facility] 08/21/2020 8:50 AM

## 2020-08-21 NOTE — Progress Notes (Signed)
follow up on her Lichen sclerosus

## 2020-08-27 ENCOUNTER — Other Ambulatory Visit: Payer: Self-pay | Admitting: Dermatology

## 2020-08-27 DIAGNOSIS — I872 Venous insufficiency (chronic) (peripheral): Secondary | ICD-10-CM

## 2020-09-04 ENCOUNTER — Other Ambulatory Visit: Payer: Self-pay | Admitting: Dermatology

## 2020-09-04 DIAGNOSIS — I872 Venous insufficiency (chronic) (peripheral): Secondary | ICD-10-CM

## 2020-09-18 ENCOUNTER — Other Ambulatory Visit: Payer: Self-pay

## 2020-09-18 ENCOUNTER — Encounter: Payer: Self-pay | Admitting: Obstetrics and Gynecology

## 2020-09-18 ENCOUNTER — Ambulatory Visit (INDEPENDENT_AMBULATORY_CARE_PROVIDER_SITE_OTHER): Payer: BC Managed Care – PPO | Admitting: Obstetrics and Gynecology

## 2020-09-18 VITALS — BP 130/72 | Ht 67.0 in | Wt 260.0 lb

## 2020-09-18 DIAGNOSIS — N9089 Other specified noninflammatory disorders of vulva and perineum: Secondary | ICD-10-CM

## 2020-09-18 DIAGNOSIS — N904 Leukoplakia of vulva: Secondary | ICD-10-CM

## 2020-09-18 NOTE — Progress Notes (Signed)
Follow up visit with injections for Lichen sclerosus.

## 2020-09-18 NOTE — Progress Notes (Signed)
Patient ID: Mackenzie [name], female   DOB: Jun 27, 1956, 65 y.o.   MRN: [id number]  Reason for Consult: Gynecologic Exam   Referred by Idelle Crouch, MD  Subjective:     HPI:  Mackenzie [name] is a 65 y.o. female. She is following up for lichen sclerosis monitoring. Intralesional injection was planned but due to national shortage of saline vials not able to obtain supplies for injection.  Reports that lesion is somewhat improved. Has been applying clobetasol 2-3 times a day. Reports topical lidocaine provides additional relief.   Past Medical History:  Diagnosis Date  . ADD (attention deficit disorder)   . Anemia    iron deficiency, b12, taking iron  . Anxiety   . Barrett's esophagus 2019  . Cold sore   . Colon polyps   . COPD (chronic obstructive pulmonary disease) ([place])   . Diverticulosis   . Dyspnea    on exertion  . Environmental allergies   . Essential hypertension   . Genital herpes 05/2019   neg culture, positive type 2 IgG.  . GERD (gastroesophageal reflux disease)   . History of hiatal hernia   . History of nerve impingement    Sees Dr. Holley Raring, Pain Management  . Hyperlipidemia   . Lichen sclerosus   . Obesity   . Osteoarthritis    hands, feet, knees  . Other abnormal auditory perceptions, bilateral 2019   hip and back issues  . RLS (restless legs syndrome)   . Shingles 09/2018  . Sleep apnea    CPAP  . Vitamin B12 deficiency   . Weakness of back    right side back and leg nerves burned / now burning left side  . Wears contact lenses    Family History  Problem Relation Age of Onset  . Breast cancer Cousin 12       mat second cousin  . Liver cancer Maternal Aunt   . Alcoholism Mother   . Hypertension Mother   . Diabetes Mother        type 1  . Congestive Heart Failure Mother   . Obesity Mother   . Diabetes Maternal Grandmother        type 2  . Breast cancer Paternal Grandmother 33  . Arthritis Sister   . Diabetes Sister   . Stroke Brother 78  .  Alcoholism Maternal Grandfather    Past Surgical History:  Procedure Laterality Date  . Barbie Banner OSTEOTOMY Left 08/11/2019   Procedure: PHALANX OSTEOTOMY AKIN;  Surgeon: Albertine Patricia, DPM;  Location: [place];  Service: Podiatry;  Laterality: Left;  . BONE EXCISION Left 08/11/2019   Procedure: SADDLEBONE;  Surgeon: Albertine Patricia, DPM;  Location: [place];  Service: Podiatry;  Laterality: Left;  sleep apnea  . BREAST CYST ASPIRATION Right 1998   neg  . broken leg repair Right   . BUNIONECTOMY Right 2010   pins in toes of right foot  . COLONOSCOPY  2014  . COLONOSCOPY WITH PROPOFOL N/A 05/24/2018   Procedure: COLONOSCOPY WITH PROPOFOL;  Surgeon: Lollie Sails, MD;  Location: [facility] ENDOSCOPY;  Service: Endoscopy;  Laterality: N/A;  . ESOPHAGOGASTRODUODENOSCOPY (EGD) WITH PROPOFOL N/A 05/24/2018   Procedure: ESOPHAGOGASTRODUODENOSCOPY (EGD) WITH PROPOFOL;  Surgeon: Lollie Sails, MD;  Location: [facility] ENDOSCOPY;  Service: Endoscopy;  Laterality: N/A;  . eye plugs Bilateral    plugs placed for dry eyes  . HALLUX VALGUS AUSTIN Left 08/11/2019   Procedure: AUSTIN (MITCHELL);  Surgeon: Albertine Patricia, DPM;  Location: [place];  Service: Podiatry;  Laterality: Left;  . INDUCED ABORTION    . JOINT REPLACEMENT Right    hip  . ORIF TOE FRACTURE Left 09/22/2019   Procedure: REMOVAL OF SCREWS WITH OPEN REDUCTION INTERNAL FIXATION (ORIF) METATARSAL (GREAT TOE);  Surgeon: Albertine Patricia, DPM;  Location: [place];  Service: Podiatry;  Laterality: Left;  pt's splint was removed and cast was applied in PACU by MD  . PLANTAR FASCIA SURGERY Left 2005  . TOTAL HIP ARTHROPLASTY Right 08/11/2018   Procedure: TOTAL HIP ARTHROPLASTY;  Surgeon: Dereck Leep, MD;  Location: [facility];  Service: Orthopedics;  Laterality: Right;    Short Social History:  Social History   Tobacco Use  . Smoking status: Former Smoker    Packs/day: 0.50    Years: 10.00     Pack years: 5.00    Types: Cigarettes    Quit date: 07/22/2016    Years since quitting: 4.1  . Smokeless tobacco: Never Used  Substance Use Topics  . Alcohol use: Yes    Alcohol/week: 1.0 - 4.0 standard drink    Types: 1 - 2 Shots of liquor per week    Comment: occasional    No Known Allergies  Current Outpatient Medications  Medication Sig Dispense Refill  . ALPRAZolam (XANAX) 1 MG tablet Take by mouth.    . budesonide-formoterol (SYMBICORT) 160-4.5 MCG/ACT inhaler Inhale 2 puffs into the lungs 2 (two) times daily. Am and lunch    . cholecalciferol (VITAMIN D3) 25 MCG (1000 UT) tablet Take 1,000 Units by mouth every 3 (three) days.    . clobetasol ointment (TEMOVATE) 0.05 % Apply vaginally twice a night for next 8 weeks 60 g 5  . cyanocobalamin (,VITAMIN B-12,) 1000 MCG/ML injection Inject into the muscle every 30 (thirty) days.     . cyclobenzaprine (FLEXERIL) 10 MG tablet Take 10 mg by mouth daily as needed for muscle spasms.     . diclofenac (VOLTAREN) 75 MG EC tablet Take 1 tablet (75 mg total) by mouth 2 (two) times daily as needed. 60 tablet 2  . ferrous sulfate 325 (65 FE) MG tablet Take 325 mg by mouth 2 (two) times daily with a meal.   3  . gabapentin (NEURONTIN) 300 MG capsule TAKE 1 CAPSULE BY MOUTH THREE TIMES A DAY    . hydrochlorothiazide (HYDRODIURIL) 25 MG tablet Take 25 mg by mouth daily. am  11  . ibuprofen (ADVIL) 600 MG tablet Take 1 tablet (600 mg total) by mouth every 6 (six) hours as needed. 60 tablet 3  . lidocaine (XYLOCAINE) 5 % ointment Apply 1 application topically 2 (two) times daily as needed. 150 g 5  . lovastatin (MEVACOR) 40 MG tablet Take 40 mg by mouth at bedtime.  3  . Melatonin 3 MG CAPS Take by mouth at bedtime.    . mometasone (ELOCON) 0.1 % cream APPLY 1 APPLICATION TOPICALLY DAILY AS NEEDED (RASH). 45 g 0  . pantoprazole (PROTONIX) 40 MG tablet Take 40 mg by mouth daily. am  3  . polyethylene glycol (MIRALAX / GLYCOLAX) packet Take 17 g by  mouth at bedtime.    . sucralfate (CARAFATE) 1 g tablet Take 1 g by mouth 2 (two) times daily.     . valACYclovir (VALTREX) 500 MG tablet TAKE 1 TABLET BY MOUTH EVERY DAY 90 tablet 0  . DULoxetine (CYMBALTA) 30 MG capsule Take 1 capsule (30 mg total) by mouth daily. 30 capsule 2  .  tacrolimus (PROTOPIC) 0.1 % ointment Apply topically 2 (two) times daily. 100 g 3   No current facility-administered medications for this visit.    Review of Systems  Constitutional: Negative for chills, fatigue, fever and unexpected weight change.  HENT: Negative for trouble swallowing.  Eyes: Negative for loss of vision.  Respiratory: Negative for cough, shortness of breath and wheezing.  Cardiovascular: Negative for chest pain, leg swelling, palpitations and syncope.  GI: Negative for abdominal pain, blood in stool, diarrhea, nausea and vomiting.  GU: Negative for difficulty urinating, dysuria, frequency and hematuria.  Musculoskeletal: Negative for back pain, leg pain and joint pain.  Skin: Negative for rash.  Neurological: Negative for dizziness, headaches, light-headedness, numbness and seizures.  Psychiatric: Negative for behavioral problem, confusion, depressed mood and sleep disturbance.        Objective:  Objective   Vitals:   09/18/20 0813  BP: 130/72  Weight: 260 lb (117.9 kg)  Height: 5\' 7"  (1.702 m)   Body mass index is 40.72 kg/m.  Physical Exam Genitourinary:    Comments: Lesion stable from last visit and appears improved.     Assessment/Plan:     65 yo with erosive lichen sclerosis,  Stable, improving lesion Continue Clobetasol twice adcay Follow up in 8 weeks for monitoring Saline vials not available, no able to do intralesional injection today.   More than 15 minutes were spent face to face with the patient in the room, reviewing the medical record, labs and images, and coordinating care for the patient. The plan of management was discussed in detail and counseling was  provided.    Adrian Prows MD, [facility], [facility] 09/18/2020 8:51 AM

## 2020-09-21 ENCOUNTER — Encounter: Payer: Self-pay | Admitting: Neurology

## 2020-09-21 ENCOUNTER — Other Ambulatory Visit: Payer: Self-pay

## 2020-09-21 DIAGNOSIS — R202 Paresthesia of skin: Secondary | ICD-10-CM

## 2020-10-31 ENCOUNTER — Ambulatory Visit: Payer: BC Managed Care – PPO | Admitting: Neurology

## 2020-10-31 ENCOUNTER — Other Ambulatory Visit: Payer: Self-pay

## 2020-10-31 DIAGNOSIS — R202 Paresthesia of skin: Secondary | ICD-10-CM

## 2020-10-31 DIAGNOSIS — M5417 Radiculopathy, lumbosacral region: Secondary | ICD-10-CM

## 2020-10-31 NOTE — Procedures (Signed)
[facility] Neurology  [address]  [place], [place] [address] Tel: [phone] Fax:  [phone] Test Date:  10/31/2020  Patient: Mackenzie [name] DOB: 1956-06-14 Physician: Narda Amber, DO  Sex: Female Height: 5\' 7"  Ref Phys: Starling Manns, MD  ID#: [id number]   Technician:    Patient Complaints: This is a 65 year old female referred for evaluation of right leg pain concerning for radiculopathy vs. sciatic neuropathy.   NCV & EMG Findings: Electrodiagnostic testing of the right lower extremity and additional studies of the left shows: 1. Bilateral sural and superficial peroneal sensory responses are within normal limits. 2. Bilateral peroneal and tibial motor responses are within normal limits. 3. Bilateral tibial H reflex studies are within normal limits. 4. Chronic motor axonal loss changes are seen affecting the L5 moderate myotomes on the right, with active denervation seen in the medius muscle. These findings are not present in the left lower extremity.  Impression: 1. Active on chronic L5 radiculopathy affecting the right lower extremity, moderate. 2. There is no evidence of a sciatic mononeuropathy or sensorimotor polyneuropathy affecting the lower extremities.   ___________________________ Narda Amber, DO    Nerve Conduction Studies Anti Sensory Summary Table   Stim Site NR Peak (ms) Norm Peak (ms) P-T Amp (V) Norm P-T Amp  Left Sup Peroneal Anti Sensory (Ant Lat Mall)  32C  12 cm    2.3 <4.6 9.8 >3  Right Sup Peroneal Anti Sensory (Ant Lat Mall)  32C  12 cm    2.2 <4.6 8.4 >3  Left Sural Anti Sensory (Lat Mall)  32C  Calf    3.1 <4.6 7.8 >3  Right Sural Anti Sensory (Lat Mall)  32C  Calf    2.8 <4.6 8.5 >3   Motor Summary Table   Stim Site NR Onset (ms) Norm Onset (ms) O-P Amp (mV) Norm O-P Amp Site1 Site2 Delta-0 (ms) Dist (cm) Vel (m/s) Norm Vel (m/s)  Left Peroneal Motor (Ext Dig Brev)  32C  Ankle    4.1 <6.0 2.9 >2.5 B Fib Ankle 7.5  37.0 49 >40  B Fib    11.6  2.9  Poplt B Fib 1.8 9.0 50 >40  Poplt    13.4  2.8         Right Peroneal Motor (Ext Dig Brev)  32C  Ankle    3.7 <6.0 2.6 >2.5 B Fib Ankle 7.8 36.0 46 >40  B Fib    11.5  2.1  Poplt B Fib 1.6 9.0 56 >40  Poplt    13.1  2.0         Left Tibial Motor (Abd Hall Brev)  32C  Ankle    3.9 <6.0 6.4 >4 Knee Ankle 7.3 39.0 53 >40  Knee    11.2  5.8         Right Tibial Motor (Abd Hall Brev)  32C  Ankle    5.3 <6.0 4.2 >4 Knee Ankle 6.7 39.0 58 >40  Knee    12.0  2.5          H Reflex Studies   NR H-Lat (ms) Lat Norm (ms) L-R H-Lat (ms)  Left Tibial (Gastroc)  32C     31.56 <35 1.09  Right Tibial (Gastroc)  32C     32.65 <35 1.09   EMG   Side Muscle Ins Act Fibs Psw Fasc Number Recrt Dur Dur. Amp Amp. Poly Poly. Comment  Right AntTibialis Nml Nml Nml Nml 1- Rapid Some 1+  Some 1+ Some 1+ N/A  Right Flex Dig Long Nml Nml Nml Nml 2- Rapid Some 1+ Some 1+ Some 1+ N/A  Right GluteusMed Nml 1+ Nml Nml 2- Rapid Some 1+ Some 1+ Some 1+ N/A  Right Lumbo Parasp Low Nml Nml Nml Nml NE - - - - - - - N/A  Right Gastroc Nml Nml Nml Nml Nml Nml Nml Nml Nml Nml Nml Nml N/A  Left AntTibialis Nml Nml Nml Nml Nml Nml Nml Nml Nml Nml Nml Nml N/A  Left Gastroc Nml Nml Nml Nml Nml Nml Nml Nml Nml Nml Nml Nml N/A  Left GluteusMed Nml Nml Nml Nml Nml Nml Nml Nml Nml Nml Nml Nml N/A  Right RectFemoris Nml Nml Nml Nml Nml Nml Nml Nml Nml Nml Nml Nml N/A      Waveforms:

## 2020-11-07 ENCOUNTER — Other Ambulatory Visit: Payer: Self-pay

## 2020-11-07 ENCOUNTER — Ambulatory Visit (INDEPENDENT_AMBULATORY_CARE_PROVIDER_SITE_OTHER): Payer: BC Managed Care – PPO | Admitting: Dermatology

## 2020-11-07 DIAGNOSIS — L900 Lichen sclerosus et atrophicus: Secondary | ICD-10-CM | POA: Diagnosis not present

## 2020-11-07 DIAGNOSIS — L578 Other skin changes due to chronic exposure to nonionizing radiation: Secondary | ICD-10-CM

## 2020-11-07 DIAGNOSIS — L570 Actinic keratosis: Secondary | ICD-10-CM | POA: Diagnosis not present

## 2020-11-07 DIAGNOSIS — L820 Inflamed seborrheic keratosis: Secondary | ICD-10-CM | POA: Diagnosis not present

## 2020-11-07 DIAGNOSIS — I872 Venous insufficiency (chronic) (peripheral): Secondary | ICD-10-CM

## 2020-11-07 DIAGNOSIS — L988 Other specified disorders of the skin and subcutaneous tissue: Secondary | ICD-10-CM | POA: Diagnosis not present

## 2020-11-07 DIAGNOSIS — L821 Other seborrheic keratosis: Secondary | ICD-10-CM

## 2020-11-07 NOTE — Progress Notes (Signed)
Follow-Up Visit   Subjective  Mackenzie [name] is a 65 y.o. female who presents for the following: hx of Stasis derm (Bil lower legs, clear, pt used the mometasone when flared and helped) and bumps (Chest, ~58m, no symptoms).  The following portions of the chart were reviewed this encounter and updated as appropriate:   Tobacco  Allergies  Meds  Problems  Med Hx  Surg Hx  Fam Hx     Review of Systems:  No other skin or systemic complaints except as noted in HPI or Assessment and Plan.  Objective  Well appearing patient in no apparent distress; mood and affect are within normal limits.  A focused examination was performed including face, chest, bil legs, arms. Relevant physical exam findings are noted in the Assessment and Plan.  Objective  bil lower legs: Stasis changes with spider veins bil lower legs  Objective  R chest x 1: Pink scaly macules   Objective  R arm x 2, L knee x 1 (3): Erythematous keratotic or waxy stuck-on papule or plaque.   Objective  face: Rhytides and volume loss.   Objective  R groin: ~7.5 x 3.0cm central thickened texture with hyperpigmentation and peripheral hypopigmentation  Images     Assessment & Plan    Actinic Damage - chronic, secondary to cumulative UV radiation exposure/sun exposure over time - diffuse scaly erythematous macules with underlying dyspigmentation - Recommend daily broad spectrum sunscreen SPF 30+ to sun-exposed areas, reapply every 2 hours as needed.  - Recommend staying in the shade or wearing long sleeves, sun glasses (UVA+UVB protection) and wide brim hats (4-inch brim around the entire circumference of the hat). - Call for new or changing lesions.  Seborrheic Keratoses - Stuck-on, waxy, tan-brown papules and plaques  - Discussed benign etiology and prognosis. - Observe - Call for any changes  Stasis dermatitis of both legs bil lower legs With spider veins Start Cerave cream, Cetaphil, Eucerin or  Vanicream Recommend mild cleanser  Cont Mometasone cr qd 5d/wk prn flares  AK (actinic keratosis) R chest x 1 Destruction of lesion - R chest x 1 Complexity: simple   Destruction method: cryotherapy   Informed consent: discussed and consent obtained   Timeout:  patient name, date of birth, surgical site, and procedure verified Lesion destroyed using liquid nitrogen: Yes   Region frozen until ice ball extended beyond lesion: Yes   Outcome: patient tolerated procedure well with no complications   Post-procedure details: wound care instructions given    Inflamed seborrheic keratosis (3) R arm x 2, L knee x 1 Destruction of lesion - R arm x 2, L knee x 1 Complexity: simple   Destruction method: cryotherapy   Informed consent: discussed and consent obtained   Timeout:  patient name, date of birth, surgical site, and procedure verified Lesion destroyed using liquid nitrogen: Yes   Region frozen until ice ball extended beyond lesion: Yes   Outcome: patient tolerated procedure well with no complications   Post-procedure details: wound care instructions given    Elastosis of skin face Recommend pt using her otc Retinol hs and sunscreen in the am  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 R groin Cont Clobetasol oint qd May discontinue if completely clears. Other Related Medications clobetasol ointment (TEMOVATE) 0.05 %  Return in about 1 year (around 11/07/2021) for TBSE, Hx of AKs.  I, Othelia Pulling, RMA, am acting as scribe for Sarina Ser, MD .  Documentation: I  have reviewed the above documentation for accuracy and completeness, and I agree with the above.  Sarina Ser, MD

## 2020-11-07 NOTE — Patient Instructions (Signed)
Start Cerave cream, Cetaphil cream, Eucerin or Vanicream to body daily

## 2020-11-08 ENCOUNTER — Encounter: Payer: Self-pay | Admitting: Dermatology

## 2020-11-13 ENCOUNTER — Ambulatory Visit (INDEPENDENT_AMBULATORY_CARE_PROVIDER_SITE_OTHER): Payer: BC Managed Care – PPO | Admitting: Obstetrics and Gynecology

## 2020-11-13 ENCOUNTER — Encounter: Payer: Self-pay | Admitting: Obstetrics and Gynecology

## 2020-11-13 ENCOUNTER — Other Ambulatory Visit: Payer: Self-pay

## 2020-11-13 VITALS — BP 128/70 | Ht 67.0 in | Wt 262.4 lb

## 2020-11-13 DIAGNOSIS — N904 Leukoplakia of vulva: Secondary | ICD-10-CM | POA: Diagnosis not present

## 2020-11-13 DIAGNOSIS — L900 Lichen sclerosus et atrophicus: Secondary | ICD-10-CM | POA: Diagnosis not present

## 2020-11-13 DIAGNOSIS — N9089 Other specified noninflammatory disorders of vulva and perineum: Secondary | ICD-10-CM

## 2020-11-13 MED ORDER — LIDOCAINE 5 % EX OINT: 1.0000 "application " | TOPICAL_OINTMENT | Freq: Two times a day (BID) | CUTANEOUS | 5 refills | Status: DC | PRN

## 2020-11-13 NOTE — Progress Notes (Signed)
Patient ID: Mackenzie [name], female   DOB: 03-01-1956, 65 y.o.   MRN: [id number]  Reason for Consult: Gynecologic Exam   Referred by Idelle Crouch, MD  Subjective:     HPI:  Mackenzie [name] is a 65 y.o. female. She is following up for lichen sclerosis. She has had a difficult disease course. She continues to apply daily corticosteroids. She also uses topical lidocaine. She has had some recent improvement, but notes that se feels like a flair is starting.    Past Medical History:  Diagnosis Date  . ADD (attention deficit disorder)   . Anemia    iron deficiency, b12, taking iron  . Anxiety   . Barrett's esophagus 2019  . Cold sore   . Colon polyps   . COPD (chronic obstructive pulmonary disease) ([place])   . Diverticulosis   . Dyspnea    on exertion  . Environmental allergies   . Essential hypertension   . Genital herpes 05/2019   neg culture, positive type 2 IgG.  . GERD (gastroesophageal reflux disease)   . History of hiatal hernia   . History of nerve impingement    Sees Dr. Holley Raring, Pain Management  . Hyperlipidemia   . Lichen sclerosus   . Obesity   . Osteoarthritis    hands, feet, knees  . Other abnormal auditory perceptions, bilateral 2019   hip and back issues  . RLS (restless legs syndrome)   . Shingles 09/2018  . Sleep apnea    CPAP  . Vitamin B12 deficiency   . Weakness of back    right side back and leg nerves burned / now burning left side  . Wears contact lenses    Family History  Problem Relation Age of Onset  . Breast cancer Cousin 14       mat second cousin  . Liver cancer Maternal Aunt   . Alcoholism Mother   . Hypertension Mother   . Diabetes Mother        type 1  . Congestive Heart Failure Mother   . Obesity Mother   . Diabetes Maternal Grandmother        type 2  . Breast cancer Paternal Grandmother 35  . Arthritis Sister   . Diabetes Sister   . Stroke Brother 58  . Alcoholism Maternal Grandfather    Past Surgical History:   Procedure Laterality Date  . Barbie Banner OSTEOTOMY Left 08/11/2019   Procedure: PHALANX OSTEOTOMY AKIN;  Surgeon: Albertine Patricia, DPM;  Location: [place];  Service: Podiatry;  Laterality: Left;  . BONE EXCISION Left 08/11/2019   Procedure: SADDLEBONE;  Surgeon: Albertine Patricia, DPM;  Location: [place];  Service: Podiatry;  Laterality: Left;  sleep apnea  . BREAST CYST ASPIRATION Right 1998   neg  . broken leg repair Right   . BUNIONECTOMY Right 2010   pins in toes of right foot  . COLONOSCOPY  2014  . COLONOSCOPY WITH PROPOFOL N/A 05/24/2018   Procedure: COLONOSCOPY WITH PROPOFOL;  Surgeon: Lollie Sails, MD;  Location: [facility] ENDOSCOPY;  Service: Endoscopy;  Laterality: N/A;  . ESOPHAGOGASTRODUODENOSCOPY (EGD) WITH PROPOFOL N/A 05/24/2018   Procedure: ESOPHAGOGASTRODUODENOSCOPY (EGD) WITH PROPOFOL;  Surgeon: Lollie Sails, MD;  Location: [facility]COPY;  Service: Endoscopy;  Laterality: N/A;  . eye plugs Bilateral    plugs placed for dry eyes  . HALLUX VALGUS AUSTIN Left 08/11/2019   Procedure: AUSTIN (MITCHELL);  Surgeon: Albertine Patricia, DPM;  Location: [place];  Service: Podiatry;  Laterality: Left;  . INDUCED ABORTION    . JOINT REPLACEMENT Right    hip  . ORIF TOE FRACTURE Left 09/22/2019   Procedure: REMOVAL OF SCREWS WITH OPEN REDUCTION INTERNAL FIXATION (ORIF) METATARSAL (GREAT TOE);  Surgeon: Albertine Patricia, DPM;  Location: [place];  Service: Podiatry;  Laterality: Left;  pt's splint was removed and cast was applied in PACU by MD  . PLANTAR FASCIA SURGERY Left 2005  . TOTAL HIP ARTHROPLASTY Right 08/11/2018   Procedure: TOTAL HIP ARTHROPLASTY;  Surgeon: Dereck Leep, MD;  Location: [facility];  Service: Orthopedics;  Laterality: Right;    Short Social History:  Social History   Tobacco Use  . Smoking status: Former Smoker    Packs/day: 0.50    Years: 10.00    Pack years: 5.00    Types: Cigarettes    Quit date:  07/22/2016    Years since quitting: 4.3  . Smokeless tobacco: Never Used  Substance Use Topics  . Alcohol use: Yes    Alcohol/week: 1.0 - 4.0 standard drink    Types: 1 - 2 Shots of liquor per week    Comment: occasional    No Known Allergies  Current Outpatient Medications  Medication Sig Dispense Refill  . ALPRAZolam (XANAX) 1 MG tablet Take by mouth.    . budesonide-formoterol (SYMBICORT) 160-4.5 MCG/ACT inhaler Inhale 2 puffs into the lungs 2 (two) times daily. Am and lunch    . cholecalciferol (VITAMIN D3) 25 MCG (1000 UT) tablet Take 1,000 Units by mouth every 3 (three) days.    . clobetasol ointment (TEMOVATE) 0.05 % Apply vaginally twice a night for next 8 weeks 60 g 5  . cyanocobalamin (,VITAMIN B-12,) 1000 MCG/ML injection Inject into the muscle every 30 (thirty) days.     . cyclobenzaprine (FLEXERIL) 10 MG tablet Take 10 mg by mouth daily as needed for muscle spasms.     . diclofenac (VOLTAREN) 75 MG EC tablet Take 1 tablet (75 mg total) by mouth 2 (two) times daily as needed. 60 tablet 2  . ferrous sulfate 325 (65 FE) MG tablet Take 325 mg by mouth 2 (two) times daily with a meal.   3  . gabapentin (NEURONTIN) 300 MG capsule TAKE 1 CAPSULE BY MOUTH THREE TIMES A DAY    . hydrochlorothiazide (HYDRODIURIL) 25 MG tablet Take 25 mg by mouth daily. am  11  . ibuprofen (ADVIL) 600 MG tablet Take 1 tablet (600 mg total) by mouth every 6 (six) hours as needed. 60 tablet 3  . lovastatin (MEVACOR) 40 MG tablet Take 40 mg by mouth at bedtime.  3  . Melatonin 3 MG CAPS Take by mouth at bedtime.    . mometasone (ELOCON) 0.1 % cream APPLY 1 APPLICATION TOPICALLY DAILY AS NEEDED (RASH). 45 g 0  . pantoprazole (PROTONIX) 40 MG tablet Take 40 mg by mouth daily. am  3  . polyethylene glycol (MIRALAX / GLYCOLAX) packet Take 17 g by mouth at bedtime.    . sucralfate (CARAFATE) 1 g tablet Take 1 g by mouth 2 (two) times daily.     . valACYclovir (VALTREX) 500 MG tablet TAKE 1 TABLET BY MOUTH  EVERY DAY 90 tablet 0  . DULoxetine (CYMBALTA) 30 MG capsule Take 1 capsule (30 mg total) by mouth daily. 30 capsule 2  . lidocaine (XYLOCAINE) 5 % ointment Apply 1 application topically 2 (two) times daily as needed. 150 g 5   No current facility-administered medications  for this visit.    Review of Systems  Constitutional: Negative for chills, fatigue, fever and unexpected weight change.  HENT: Negative for trouble swallowing.  Eyes: Negative for loss of vision.  Respiratory: Negative for cough, shortness of breath and wheezing.  Cardiovascular: Negative for chest pain, leg swelling, palpitations and syncope.  GI: Negative for abdominal pain, blood in stool, diarrhea, nausea and vomiting.  GU: Negative for difficulty urinating, dysuria, frequency and hematuria.  Musculoskeletal: Negative for back pain, leg pain and joint pain.  Skin: Negative for rash.  Neurological: Negative for dizziness, headaches, light-headedness, numbness and seizures.  Psychiatric: Negative for behavioral problem, confusion, depressed mood and sleep disturbance.        Objective:  Objective   Vitals:   11/13/20 0808  BP: 128/70  Weight: 262 lb 6.4 oz (119 kg)  Height: 5\' 7"  (1.702 m)   Body mass index is 41.1 kg/m.  Physical Exam Vitals and nursing note reviewed. Exam conducted with a chaperone present.  Constitutional:      Appearance: Normal appearance. She is well-developed.  HENT:     Head: Normocephalic and atraumatic.  Eyes:     Extraocular Movements: Extraocular movements intact.     Pupils: Pupils are equal, round, and reactive to light.  Cardiovascular:     Rate and Rhythm: Normal rate and regular rhythm.  Pulmonary:     Effort: Pulmonary effort is normal. No respiratory distress.     Breath sounds: Normal breath sounds.  Abdominal:     General: Abdomen is flat.     Palpations: Abdomen is soft.  Genitourinary:      Comments: Some visual improvement of lesion and erosion is  almost totally healed. Musculoskeletal:        General: No signs of injury.  Skin:    General: Skin is warm and dry.  Neurological:     Mental Status: She is alert and oriented to person, place, and time.  Psychiatric:        Behavior: Behavior normal.        Thought Content: Thought content normal.        Judgment: Judgment normal.    PROCEDURE NOTE:   Solution on 2 mL of saline an 1 mL of triamcinolone (10mg /mL) mixed.  Vulva cleansed well with betadine. Lichen sclerosis lesion of left labia injected with 1 mL of  solution. No complication. Patient tolerated injection well. She will follow up in 1 month for injection. Will rperform injection 3 times. This is her first injection.     Assessment/Plan:     65 yo with severe lichen sclerosis.  Will return for intralesional injection again in 1 month. Will repeat for a total of 3 injections.  Refill for topical lidocaine sent.   Adrian Prows MD, [facility], [facility] 11/13/2020 8:53 AM

## 2020-12-11 ENCOUNTER — Ambulatory Visit: Payer: BC Managed Care – PPO | Admitting: Obstetrics and Gynecology

## 2020-12-14 ENCOUNTER — Other Ambulatory Visit: Payer: Self-pay | Admitting: Internal Medicine

## 2020-12-14 DIAGNOSIS — Z1231 Encounter for screening mammogram for malignant neoplasm of breast: Secondary | ICD-10-CM

## 2020-12-17 ENCOUNTER — Encounter: Payer: Self-pay | Admitting: Obstetrics and Gynecology

## 2020-12-17 ENCOUNTER — Other Ambulatory Visit: Payer: Self-pay

## 2020-12-17 ENCOUNTER — Ambulatory Visit: Payer: BC Managed Care – PPO | Admitting: Obstetrics and Gynecology

## 2020-12-17 VITALS — BP 115/70 | Ht 68.0 in | Wt 268.0 lb

## 2020-12-17 DIAGNOSIS — N904 Leukoplakia of vulva: Secondary | ICD-10-CM | POA: Diagnosis not present

## 2020-12-17 DIAGNOSIS — L900 Lichen sclerosus et atrophicus: Secondary | ICD-10-CM | POA: Diagnosis not present

## 2020-12-17 NOTE — Progress Notes (Signed)
Patient ID: Mackenzie [name], female   DOB: [birth year], 65 y.o.   MRN: [id number]  Reason for Consult: Gynecologic Exam   Referred by Idelle Crouch, MD  Subjective:     HPI:  KYMBERLEY [name] is a 65 y.o. female. She is following up for lichen sclerosis injection #2. She has had a difficult disease course. She continues to apply daily corticosteroids. She also uses topical lidocaine. She has had some improvement after the last injection, but has noted some worsening of symptoms recently.   Past Medical History:  Diagnosis Date  . ADD (attention deficit disorder)   . Anemia    iron deficiency, b12, taking iron  . Anxiety   . Barrett's esophagus 2019  . Cold sore   . Colon polyps   . COPD (chronic obstructive pulmonary disease) ([place])   . Diverticulosis   . Dyspnea    on exertion  . Environmental allergies   . Essential hypertension   . Genital herpes 05/2019   neg culture, positive type 2 IgG.  . GERD (gastroesophageal reflux disease)   . History of hiatal hernia   . History of nerve impingement    Sees Dr. Holley Raring, Pain Management  . Hyperlipidemia   . Lichen sclerosus   . Obesity   . Osteoarthritis    hands, feet, knees  . Other abnormal auditory perceptions, bilateral 2019   hip and back issues  . RLS (restless legs syndrome)   . Shingles 09/2018  . Sleep apnea    CPAP  . Vitamin B12 deficiency   . Weakness of back    right side back and leg nerves burned / now burning left side  . Wears contact lenses    Family History  Problem Relation Age of Onset  . Breast cancer Cousin 31       mat second cousin  . Liver cancer Maternal Aunt   . Alcoholism Mother   . Hypertension Mother   . Diabetes Mother        type 1  . Congestive Heart Failure Mother   . Obesity Mother   . Diabetes Maternal Grandmother        type 2  . Breast cancer Paternal Grandmother 17  . Arthritis Sister   . Diabetes Sister   . Stroke Brother 9  . Alcoholism Maternal Grandfather     Past Surgical History:  Procedure Laterality Date  . Barbie Banner OSTEOTOMY Left 08/11/2019   Procedure: PHALANX OSTEOTOMY AKIN;  Surgeon: Albertine Patricia, DPM;  Location: [place];  Service: Podiatry;  Laterality: Left;  . BONE EXCISION Left 08/11/2019   Procedure: SADDLEBONE;  Surgeon: Albertine Patricia, DPM;  Location: [place];  Service: Podiatry;  Laterality: Left;  sleep apnea  . BREAST CYST ASPIRATION Right 1998   neg  . broken leg repair Right   . BUNIONECTOMY Right 2010   pins in toes of right foot  . COLONOSCOPY  2014  . COLONOSCOPY WITH PROPOFOL N/A 05/24/2018   Procedure: COLONOSCOPY WITH PROPOFOL;  Surgeon: Lollie Sails, MD;  Location[facility] ENDOSCOPY;  Service: Endoscopy;  Laterality: N/A;  . ESOPHAGOGASTRODUODENOSCOPY (EGD) WITH PROPOFOL N/A 05/24/2018   Procedure: ESOPHAGOGASTRODUODENOSCOPY (EGD) WITH PROPOFOL;  Surgeon: Lollie Sails, MD;  Location[facility] END[facility]COPY;  Service: Endoscopy;  Laterality: N/A;  . eye plugs Bilateral    plugs placed for dry eyes  . HALLUX VALGUS AUSTIN Left 08/11/2019   Procedure: AUSTIN (MITCHELL);  Surgeon: Albertine Patricia, DPM;  Location: [place]  [facility];  Service: Podiatry;  Laterality: Left;  . INDUCED ABORTION    . JOINT REPLACEMENT Right    hip  . ORIF TOE FRACTURE Left 09/22/2019   Procedure: REMOVAL OF SCREWS WITH OPEN REDUCTION INTERNAL FIXATION (ORIF) METATARSAL (GREAT TOE);  Surgeon: Albertine Patricia, DPM;  Location: [place];  Service: Podiatry;  Laterality: Left;  pt's splint was removed and cast was applied in PACU by MD  . PLANTAR FASCIA SURGERY Left 2005  . TOTAL HIP ARTHROPLASTY Right 08/11/2018   Procedure: TOTAL HIP ARTHROPLASTY;  Surgeon: Dereck Leep, MD;  Location: [facility];  Service: Orthopedics;  Laterality: Right;    Short Social History:  Social History   Tobacco Use  . Smoking status: Former Smoker    Packs/day: 0.50    Years: 10.00    Pack years: 5.00    Types:  Cigarettes    Quit date: 07/22/2016    Years since quitting: 4.4  . Smokeless tobacco: Never Used  Substance Use Topics  . Alcohol use: Yes    Alcohol/week: 1.0 - 4.0 standard drink    Types: 1 - 2 Shots of liquor per week    Comment: occasional    No Known Allergies  Current Outpatient Medications  Medication Sig Dispense Refill  . ALPRAZolam (XANAX) 1 MG tablet Take by mouth.    . budesonide-formoterol (SYMBICORT) 160-4.5 MCG/ACT inhaler Inhale 2 puffs into the lungs 2 (two) times daily. Am and lunch    . cholecalciferol (VITAMIN D3) 25 MCG (1000 UT) tablet Take 1,000 Units by mouth every 3 (three) days.    . clobetasol ointment (TEMOVATE) 0.05 % Apply vaginally twice a night for next 8 weeks 60 g 5  . cyanocobalamin (,VITAMIN B-12,) 1000 MCG/ML injection Inject into the muscle every 30 (thirty) days.     . cyclobenzaprine (FLEXERIL) 10 MG tablet Take 10 mg by mouth daily as needed for muscle spasms.     . diclofenac (VOLTAREN) 75 MG EC tablet Take 1 tablet (75 mg total) by mouth 2 (two) times daily as needed. 60 tablet 2  . ferrous sulfate 325 (65 FE) MG tablet Take 325 mg by mouth 2 (two) times daily with a meal.   3  . gabapentin (NEURONTIN) 300 MG capsule TAKE 1 CAPSULE BY MOUTH THREE TIMES A DAY    . hydrochlorothiazide (HYDRODIURIL) 25 MG tablet Take 25 mg by mouth daily. am  11  . ibuprofen (ADVIL) 600 MG tablet Take 1 tablet (600 mg total) by mouth every 6 (six) hours as needed. 60 tablet 3  . lidocaine (XYLOCAINE) 5 % ointment Apply 1 application topically 2 (two) times daily as needed. 150 g 5  . lovastatin (MEVACOR) 40 MG tablet Take 40 mg by mouth at bedtime.  3  . Melatonin 3 MG CAPS Take by mouth at bedtime.    . mometasone (ELOCON) 0.1 % cream APPLY 1 APPLICATION TOPICALLY DAILY AS NEEDED (RASH). 45 g 0  . pantoprazole (PROTONIX) 40 MG tablet Take 40 mg by mouth daily. am  3  . polyethylene glycol (MIRALAX / GLYCOLAX) packet Take 17 g by mouth at bedtime.    .  sucralfate (CARAFATE) 1 g tablet Take 1 g by mouth 2 (two) times daily.     . valACYclovir (VALTREX) 500 MG tablet TAKE 1 TABLET BY MOUTH EVERY DAY 90 tablet 0  . DULoxetine (CYMBALTA) 30 MG capsule Take 1 capsule (30 mg total) by mouth daily. 30 capsule 2   No current  facility-administered medications for this visit.    Review of Systems  Constitutional: Negative for chills, fatigue, fever and unexpected weight change.  HENT: Negative for trouble swallowing.  Eyes: Negative for loss of vision.  Respiratory: Negative for cough, shortness of breath and wheezing.  Cardiovascular: Negative for chest pain, leg swelling, palpitations and syncope.  GI: Negative for abdominal pain, blood in stool, diarrhea, nausea and vomiting.  GU: Negative for difficulty urinating, dysuria, frequency and hematuria.  Musculoskeletal: Negative for back pain, leg pain and joint pain.  Skin: Negative for rash.  Neurological: Negative for dizziness, headaches, light-headedness, numbness and seizures.  Psychiatric: Negative for behavioral problem, confusion, depressed mood and sleep disturbance.        Objective:  Objective   Vitals:   12/17/20 1635  BP: 115/70  Weight: 268 lb (121.6 kg)  Height: 5\' 8"  (1.727 m)   Body mass index is 40.75 kg/m.  Physical Exam Exam conducted with a chaperone present.  Genitourinary:       PROCEDURE NOTE:  Solution on 2 mL of saline an 1 mL of triamcinolone (10mg /mL) mixed.  Vulva cleansed well with betadine. Lichen sclerosis lesions of left labia injected with 1 mL of  solution. No complication. Patient tolerated injection well. She will follow up in 1 month for injection. Will perform injection 3 times. This is her second injection.   Assessment/Plan:     65 yo with erosive lichen sclerosis, undergoing intralesional injections. Today is injection #2 or 3 injections Will return for intralesional injection again in 1 month. Will repeat for a total of 3  injections.  Given lack of total resolution in her symptoms, I advised that I recommend a consultation with vulvar dermatology specialist. She has some ongoing orthopedic issues which limit her mobility significantly though and make travel difficult.  More than 20 minutes were spent face to face with the patient in the room, reviewing the medical record, labs and images, and coordinating care for the patient. The plan of management was discussed in detail and counseling was provided.      Adrian Prows MD [facility], [facility] 12/17/2020 5:23 PM

## 2020-12-31 ENCOUNTER — Ambulatory Visit: Payer: Self-pay

## 2020-12-31 ENCOUNTER — Ambulatory Visit: Payer: BC Managed Care – PPO | Admitting: Orthopaedic Surgery

## 2020-12-31 VITALS — Ht 66.0 in | Wt 265.0 lb

## 2020-12-31 DIAGNOSIS — Z96641 Presence of right artificial hip joint: Secondary | ICD-10-CM | POA: Diagnosis not present

## 2020-12-31 DIAGNOSIS — M25551 Pain in right hip: Secondary | ICD-10-CM

## 2020-12-31 DIAGNOSIS — M2170 Unequal limb length (acquired), unspecified site: Secondary | ICD-10-CM

## 2020-12-31 NOTE — Progress Notes (Signed)
Office Visit Note   Patient: Mackenzie [name]           Date of Birth: May 03, 1956           MRN: [id number] Visit Date: 12/31/2020              Requested by: Idelle Crouch, MD [facility] [place],  [place] [address] PCP: Idelle Crouch, MD   Assessment & Plan: Visit Diagnoses:  1. Pain in right hip   2. Hx of total hip arthroplasty, right   3. Leg length difference, acquired     Plan: I do feel that her leg length discrepancy is probably contributing in some degree to her back issues.  Certainly with a longer hip ball this could balance her and help her back some.  However, I agree with the expertise of Dr. Patrice Paradise that she may still end up with back problems that would require surgical intervention.  From my standpoint, I will need her to lose some weight given her BMI of almost 43.  She understands that as well.  I spent a considerable amount time showing her hip model and I do feel that with her 32+5 hip ball that we can increase her to a 32+8.5 versus +12 to get her leg lengths back.  We can do this an anterior approach.  I talked about that in detail as well as the risk and benefits of any type of surgery.  All questions and concerns were answered and addressed.  We will see her back in 2 months for repeat weight and BMI calculation.  Follow-Up Instructions: Return in about 2 months (around 03/02/2021).   Orders:  Orders Placed This Encounter  Procedures  . XR HIP UNILAT W OR W/O PELVIS 1V RIGHT   No orders of the defined types were placed in this encounter.     Procedures: No procedures performed   Clinical Data: No additional findings.   Subjective: Chief Complaint  Patient presents with  . Right Hip - Pain  The patient is a very pleasant 65 year old with a complicated history as it involves her right hip.  She does have a history of a right total hip arthroplasty done in [place] in 2019.  This was done through a posterior approach.   I do have the immediate postoperative x-rays and new x-rays today of the right hip.  According the patient the femoral component did subside and there is a leg length discrepancy with the right side significantly shorter than the left side.  She has dealt with chronic pain as a result of this but also has had a history of chronic low back pain.  Is been recommended that she consider surgery on her lumbar spine or even some type of spinal cord stimulator.  She saw Dr. Patrice Paradise with spine and scoliosis specialist to recommended that she see me to talk about the leg length discrepancy.  I was able to pull up her op note from her right hip replacement and review this to get an idea of what components are in her and the length of the hip balls.  She does have a shoe buildup on her right side.  She stands better when she uses a walker at home according to her husband.  I was able to review a list of her medications as well.  Of note she does have a BMI of 42.77.  She is not a diabetic.  A three-phase bone scan did  not show any significant evidence of prosthetic loosening.  HPI  Review of Systems There is no reported headache, chest pain, shortness of breath, fever, chills, nausea, vomiting  Objective: Vital Signs: Ht 5\' 6"  (1.676 m)   Wt 265 lb (120.2 kg)   BMI 42.77 kg/m   Physical Exam She is alert and orient x3 and in no acute distress Ortho Exam On examination and have her lay in a supine position.  There is a significant leg length discrepancy with her right operative side shorter than the left side.  She does have a larger thigh and there is some skin scarring anteriorly but not from any type of surgery it is more from a chronic skin condition that she does have. Specialty Comments:  No specialty comments available.  Imaging: No results found.   PMFS History: Patient Active Problem List   Diagnosis Date Noted  . Leg length difference, acquired 12/31/2020  . Hx of total hip arthroplasty,  right 12/31/2020  . Herpes simplex vulvovaginitis 01/13/2020  . OSA on CPAP 07/11/2019  . SOB (shortness of breath) on exertion 07/07/2019  . Pedal edema 07/07/2019  . Lumbar radiculopathy (R L5/S1) 03/22/2019  . Chronic radicular lumbar pain 03/22/2019  . Lumbar facet arthropathy 03/22/2019  . Lumbar spondylosis 03/22/2019  . Spinal stenosis, lumbar region, with neurogenic claudication 03/22/2019  . Lumbar degenerative disc disease 03/22/2019  . Sacroiliac joint pain 03/22/2019  . Chronic pain syndrome 03/22/2019  . S/P hip replacement, right 10/03/2018  . Adenomatous colon polyp 08/11/2018  . Diverticulosis 08/11/2018  . Dyspnea on exertion 08/11/2018  . Environmental allergies 08/11/2018  . Ganglion of joint 08/11/2018  . Hiatal hernia 08/11/2018  . Localized, primary osteoarthritis of hand 08/11/2018  . Obesity 08/11/2018  . Osteoarthritis of knee 08/11/2018  . Hypertension 08/11/2018  . B12 deficiency 08/11/2018  . H/O total hip arthroplasty 08/11/2018  . Essential hypertension 02/08/2017  . Hyperlipidemia 02/08/2017  . COPD (chronic obstructive pulmonary disease) ([place]) 02/08/2017  . Anemia 02/08/2017  . Vitamin B12 deficiency 02/08/2017  . Lichen sclerosus et atrophicus 02/08/2017  . Adult ADHD 01/31/2016   Past Medical History:  Diagnosis Date  . ADD (attention deficit disorder)   . Anemia    iron deficiency, b12, taking iron  . Anxiety   . Barrett's esophagus 2019  . Cold sore   . Colon polyps   . COPD (chronic obstructive pulmonary disease) ([place])   . Diverticulosis   . Dyspnea    on exertion  . Environmental allergies   . Essential hypertension   . Genital herpes 05/2019   neg culture, positive type 2 IgG.  . GERD (gastroesophageal reflux disease)   . History of hiatal hernia   . History of nerve impingement    Sees Dr. Holley Raring, Pain Management  . Hyperlipidemia   . Lichen sclerosus   . Obesity   . Osteoarthritis    hands, feet, knees  . Other  abnormal auditory perceptions, bilateral 2019   hip and back issues  . RLS (restless legs syndrome)   . Shingles 09/2018  . Sleep apnea    CPAP  . Vitamin B12 deficiency   . Weakness of back    right side back and leg nerves burned / now burning left side  . Wears contact lenses     Family History  Problem Relation Age of Onset  . Breast cancer Cousin 75       mat second cousin  . Liver cancer Maternal Aunt   .  Alcoholism Mother   . Hypertension Mother   . Diabetes Mother        type 1  . Congestive Heart Failure Mother   . Obesity Mother   . Diabetes Maternal Grandmother        type 2  . Breast cancer Paternal Grandmother 48  . Arthritis Sister   . Diabetes Sister   . Stroke Brother 42  . Alcoholism Maternal Grandfather     Past Surgical History:  Procedure Laterality Date  . Barbie Banner OSTEOTOMY Left 08/11/2019   Procedure: PHALANX OSTEOTOMY AKIN;  Surgeon: Albertine Patricia, DPM;  Location: [place];  Service: Podiatry;  Laterality: Left;  . BONE EXCISION Left 08/11/2019   Procedure: SADDLEBONE;  Surgeon: Albertine Patricia, DPM;  Location: [place];  Service: Podiatry;  Laterality: Left;  sleep apnea  . BREAST CYST ASPIRATION Right 1998   neg  . broken leg repair Right   . BUNIONECTOMY Right 2010   pins in toes of right foot  . COLONOSCOPY  2014  . COLONOSCOPY WITH PROPOFOL N/A 05/24/2018   Procedure: COLONOSCOPY WITH PROPOFOL;  Surgeon: Lollie Sails, MD;  [facility] ENDOSCOPY;  Service: Endoscopy;  Laterality: N/A;  . ESOPHAGOGASTRODUODENOSCOPY (EGD) WITH PROPOFOL N/A 05/24/2018   Procedure: ESOPHAGOGASTRODUODENOSCOPY (EGD) WITH PROPOFOL;  Surgeon: Lollie Sails, MD;  Location: [facility] ENDOSCOPY;  Service: Endoscopy;  Laterality: N/A;  . eye plugs Bilateral    plugs placed for dry eyes  . HALLUX VALGUS AUSTIN Left 08/11/2019   Procedure: AUSTIN (MITCHELL);  Surgeon: Albertine Patricia, DPM;  Location: [place];  Service: Podiatry;   Laterality: Left;  . INDUCED ABORTION    . JOINT REPLACEMENT Right    hip  . ORIF TOE FRACTURE Left 09/22/2019   Procedure: REMOVAL OF SCREWS WITH OPEN REDUCTION INTERNAL FIXATION (ORIF) METATARSAL (GREAT TOE);  Surgeon: Albertine Patricia, DPM;  Location: [place];  Service: Podiatry;  Laterality: Left;  pt's splint was removed and cast was applied in PACU by MD  . PLANTAR FASCIA SURGERY Left 2005  . TOTAL HIP ARTHROPLASTY Right 08/11/2018   Procedure: TOTAL HIP ARTHROPLASTY;  Surgeon: Dereck Leep, MD;  Location: [facility];  Service: Orthopedics;  Laterality: Right;   Social History   Occupational History  . Occupation: Surveyor, minerals    Comment: school Network engineer  Tobacco Use  . Smoking status: Former Smoker    Packs/day: 0.50    Years: 10.00    Pack years: 5.00    Types: Cigarettes    Quit date: 07/22/2016    Years since quitting: 4.4  . Smokeless tobacco: Never Used  Vaping Use  . Vaping Use: Never used  Substance and Sexual Activity  . Alcohol use: Yes    Alcohol/week: 1.0 - 4.0 standard drink    Types: 1 - 2 Shots of liquor per week    Comment: occasional  . Drug use: No  . Sexual activity: Not Currently    Partners: Male    Birth control/protection: Post-menopausal

## 2021-01-14 ENCOUNTER — Ambulatory Visit (INDEPENDENT_AMBULATORY_CARE_PROVIDER_SITE_OTHER): Payer: BC Managed Care – PPO | Admitting: Obstetrics and Gynecology

## 2021-01-14 ENCOUNTER — Other Ambulatory Visit: Payer: Self-pay

## 2021-01-14 ENCOUNTER — Encounter: Payer: Self-pay | Admitting: Obstetrics and Gynecology

## 2021-01-14 VITALS — BP 136/72 | Ht 66.0 in | Wt 263.0 lb

## 2021-01-14 DIAGNOSIS — N904 Leukoplakia of vulva: Secondary | ICD-10-CM

## 2021-01-14 DIAGNOSIS — N9089 Other specified noninflammatory disorders of vulva and perineum: Secondary | ICD-10-CM

## 2021-01-14 DIAGNOSIS — L900 Lichen sclerosus et atrophicus: Secondary | ICD-10-CM

## 2021-01-14 DIAGNOSIS — R300 Dysuria: Secondary | ICD-10-CM

## 2021-01-14 LAB — POCT URINALYSIS DIPSTICK
Bilirubin, UA: NEGATIVE
Glucose, UA: NEGATIVE
Ketones, UA: NEGATIVE
Nitrite, UA: NEGATIVE
Protein, UA: NEGATIVE
Spec Grav, UA: 1.025 (ref 1.010–1.025)
Urobilinogen, UA: 0.2 E.U./dL
pH, UA: 5 (ref 5.0–8.0)

## 2021-01-14 NOTE — Progress Notes (Signed)
Patient ID: Mackenzie [name], female   DOB: 1956/07/17, 65 y.o.   MRN: [id number]  Reason for Consult: Gynecologic Exam   Referred by Idelle Crouch, MD  Subjective:     HPI:  Mackenzie [name] is a 65 y.o. female. She reports she feels like the injections have helped decrease the frequency of her lichen sclerosis outbreaks. She did hhave a flar up last week.   She also recently had a UTI. She took antibiotics about 1 month ago. She wonders if she has a UTI again.   Gynecological History  No LMP recorded. Patient is postmenopausal.   Past Medical History:  Diagnosis Date  . ADD (attention deficit disorder)   . Anemia    iron deficiency, b12, taking iron  . Anxiety   . Barrett's esophagus 2019  . Cold sore   . Colon polyps   . COPD (chronic obstructive pulmonary disease) ([place])   . Diverticulosis   . Dyspnea    on exertion  . Environmental allergies   . Essential hypertension   . Genital herpes 05/2019   neg culture, positive type 2 IgG.  . GERD (gastroesophageal reflux disease)   . History of hiatal hernia   . History of nerve impingement    Sees Dr. Holley Raring, Pain Management  . Hyperlipidemia   . Lichen sclerosus   . Obesity   . Osteoarthritis    hands, feet, knees  . Other abnormal auditory perceptions, bilateral 2019   hip and back issues  . RLS (restless legs syndrome)   . Shingles 09/2018  . Sleep apnea    CPAP  . Vitamin B12 deficiency   . Weakness of back    right side back and leg nerves burned / now burning left side  . Wears contact lenses    Family History  Problem Relation Age of Onset  . Breast cancer Cousin 61       mat second cousin  . Liver cancer Maternal Aunt   . Alcoholism Mother   . Hypertension Mother   . Diabetes Mother        type 1  . Congestive Heart Failure Mother   . Obesity Mother   . Diabetes Maternal Grandmother        type 2  . Breast cancer Paternal Grandmother 48  . Arthritis Sister   . Diabetes Sister   . Stroke  Brother 32  . Alcoholism Maternal Grandfather    Past Surgical History:  Procedure Laterality Date  . Barbie Banner OSTEOTOMY Left 08/11/2019   Procedure: PHALANX OSTEOTOMY AKIN;  Surgeon: Albertine Patricia, DPM;  Location: [place];  Service: Podiatry;  Laterality: Left;  . BONE EXCISION Left 08/11/2019   Procedure: SADDLEBONE;  Surgeon: Albertine Patricia, DPM;  Location: [place];  Service: Podiatry;  Laterality: Left;  sleep apnea  . BREAST CYST ASPIRATION Right 1998   neg  . broken leg repair Right   . BUNIONECTOMY Right 2010   pins in toes of right foot  . COLONOSCOPY  2014  . COLONOSCOPY WITH PROPOFOL N/A 05/24/2018   Procedure: COLONOSCOPY WITH PROPOFOL;  Surgeon: Lollie Sails, MD;  Location: [facility] ENDOSCOPY;  Service: Endoscopy;  Laterality: N/A;  . ESOPHAGOGASTRODUODENOSCOPY (EGD) WITH PROPOFOL N/A 05/24/2018   Procedure: ESOPHAGOGASTRODUODENOSCOPY (EGD) WITH PROPOFOL;  Surgeon: Lollie Sails, MD;  Location: [facility] ENDOSCOPY;  Service: Endoscopy;  Laterality: N/A;  . eye plugs Bilateral    plugs placed for dry eyes  . HALLUX VALGUS AUSTIN Left  08/11/2019   Procedure: AUSTIN (MITCHELL);  Surgeon: Albertine Patricia, DPM;  Location: [place];  Service: Podiatry;  Laterality: Left;  . INDUCED ABORTION    . JOINT REPLACEMENT Right    hip  . ORIF TOE FRACTURE Left 09/22/2019   Procedure: REMOVAL OF SCREWS WITH OPEN REDUCTION INTERNAL FIXATION (ORIF) METATARSAL (GREAT TOE);  Surgeon: Albertine Patricia, DPM;  Location: [place];  Service: Podiatry;  Laterality: Left;  pt's splint was removed and cast was applied in PACU by MD  . PLANTAR FASCIA SURGERY Left 2005  . TOTAL HIP ARTHROPLASTY Right 08/11/2018   Procedure: TOTAL HIP ARTHROPLASTY;  Surgeon: Dereck Leep, MD;  Location: [facility];  Service: Orthopedics;  Laterality: Right;    Short Social History:  Social History   Tobacco Use  . Smoking status: Former Smoker    Packs/day: 0.50     Years: 10.00    Pack years: 5.00    Types: Cigarettes    Quit date: 07/22/2016    Years since quitting: 4.4  . Smokeless tobacco: Never Used  Substance Use Topics  . Alcohol use: Yes    Alcohol/week: 1.0 - 4.0 standard drink    Types: 1 - 2 Shots of liquor per week    Comment: occasional    No Known Allergies  Current Outpatient Medications  Medication Sig Dispense Refill  . ALPRAZolam (XANAX) 1 MG tablet Take by mouth.    . budesonide-formoterol (SYMBICORT) 160-4.5 MCG/ACT inhaler Inhale 2 puffs into the lungs 2 (two) times daily. Am and lunch    . cholecalciferol (VITAMIN D3) 25 MCG (1000 UT) tablet Take 1,000 Units by mouth every 3 (three) days.    . clobetasol ointment (TEMOVATE) 0.05 % Apply vaginally twice a night for next 8 weeks 60 g 5  . cyanocobalamin (,VITAMIN B-12,) 1000 MCG/ML injection Inject into the muscle every 30 (thirty) days.     . cyclobenzaprine (FLEXERIL) 10 MG tablet Take 10 mg by mouth daily as needed for muscle spasms.     . diclofenac (VOLTAREN) 75 MG EC tablet Take 1 tablet (75 mg total) by mouth 2 (two) times daily as needed. 60 tablet 2  . ferrous sulfate 325 (65 FE) MG tablet Take 325 mg by mouth 2 (two) times daily with a meal.   3  . gabapentin (NEURONTIN) 300 MG capsule TAKE 1 CAPSULE BY MOUTH THREE TIMES A DAY    . hydrochlorothiazide (HYDRODIURIL) 25 MG tablet Take 25 mg by mouth daily. am  11  . ibuprofen (ADVIL) 600 MG tablet Take 1 tablet (600 mg total) by mouth every 6 (six) hours as needed. 60 tablet 3  . lidocaine (XYLOCAINE) 5 % ointment Apply 1 application topically 2 (two) times daily as needed. 150 g 5  . lovastatin (MEVACOR) 40 MG tablet Take 40 mg by mouth at bedtime.  3  . Melatonin 3 MG CAPS Take by mouth at bedtime.    . mometasone (ELOCON) 0.1 % cream APPLY 1 APPLICATION TOPICALLY DAILY AS NEEDED (RASH). 45 g 0  . pantoprazole (PROTONIX) 40 MG tablet Take 40 mg by mouth daily. am  3  . polyethylene glycol (MIRALAX / GLYCOLAX) packet  Take 17 g by mouth at bedtime.    . sucralfate (CARAFATE) 1 g tablet Take 1 g by mouth 2 (two) times daily.     . valACYclovir (VALTREX) 500 MG tablet TAKE 1 TABLET BY MOUTH EVERY DAY 90 tablet 0  . DULoxetine (CYMBALTA) 30 MG capsule Take  1 capsule (30 mg total) by mouth daily. 30 capsule 2   No current facility-administered medications for this visit.    Review of Systems  Constitutional: Negative for chills, fatigue, fever and unexpected weight change.  HENT: Negative for trouble swallowing.  Eyes: Negative for loss of vision.  Respiratory: Negative for cough, shortness of breath and wheezing.  Cardiovascular: Negative for chest pain, leg swelling, palpitations and syncope.  GI: Negative for abdominal pain, blood in stool, diarrhea, nausea and vomiting.  GU: Negative for difficulty urinating, dysuria, frequency and hematuria.  Musculoskeletal: Negative for back pain, leg pain and joint pain.  Skin: Negative for rash.  Neurological: Negative for dizziness, headaches, light-headedness, numbness and seizures.  Psychiatric: Negative for behavioral problem, confusion, depressed mood and sleep disturbance.        Objective:  Objective   Vitals:   01/14/21 0904  BP: 136/72  Weight: 263 lb (119.3 kg)  Height: 5\' 6"  (1.676 m)   Body mass index is 42.45 kg/m.  Physical Exam Vitals and nursing note reviewed. Exam conducted with a chaperone present.  Constitutional:      Appearance: Normal appearance. She is well-developed.  HENT:     Head: Normocephalic and atraumatic.  Eyes:     Extraocular Movements: Extraocular movements intact.     Pupils: Pupils are equal, round, and reactive to light.  Cardiovascular:     Rate and Rhythm: Normal rate and regular rhythm.  Pulmonary:     Effort: Pulmonary effort is normal. No respiratory distress.     Breath sounds: Normal breath sounds.  Abdominal:     General: Abdomen is flat.     Palpations: Abdomen is soft.   Genitourinary:   Musculoskeletal:        General: No signs of injury.  Skin:    General: Skin is warm and dry.  Neurological:     Mental Status: She is alert and oriented to person, place, and time.  Psychiatric:        Behavior: Behavior normal.        Thought Content: Thought content normal.        Judgment: Judgment normal.      PROCEDURE NOTE:  Solution on 2 mL of saline an 1 mL of triamcinolone (10mg /mL) mixed.  Vulva cleansed well with betadine. Lichen sclerosis lesions of left labia injected with 1 mL of solution. No complication. Patient tolerated injection well. She will follow up in 1 month for injection. Will perform injection 3 times. This is her third injection.   Assessment/Plan:     66 with  1. Lichen sclerosis- 3 of 3 injections today. Continue with clobetasol daily and lidocaine PRN 2. Urine culture sent  More than 10 minutes were spent face to face with the patient in the room, reviewing the medical record, labs and images, and coordinating care for the patient. The plan of management was discussed in detail and counseling was provided.       Adrian Prows MD [facility], [facility] 01/14/2021 9:13 AM

## 2021-01-16 ENCOUNTER — Other Ambulatory Visit: Payer: Self-pay | Admitting: Obstetrics and Gynecology

## 2021-01-16 DIAGNOSIS — N3001 Acute cystitis with hematuria: Secondary | ICD-10-CM

## 2021-01-16 LAB — URINE CULTURE

## 2021-01-16 MED ORDER — CEPHALEXIN 500 MG PO CAPS: 500.0000 mg | ORAL_CAPSULE | Freq: Four times a day (QID) | ORAL | 0 refills | Status: AC

## 2021-01-16 NOTE — Progress Notes (Signed)
Keflex rx sent

## 2021-02-11 ENCOUNTER — Ambulatory Visit (INDEPENDENT_AMBULATORY_CARE_PROVIDER_SITE_OTHER): Payer: BC Managed Care – PPO | Admitting: Obstetrics and Gynecology

## 2021-02-11 ENCOUNTER — Encounter: Payer: Self-pay | Admitting: Obstetrics and Gynecology

## 2021-02-11 ENCOUNTER — Other Ambulatory Visit: Payer: Self-pay

## 2021-02-11 ENCOUNTER — Other Ambulatory Visit (HOSPITAL_COMMUNITY)
Admission: RE | Admit: 2021-02-11 | Discharge: 2021-02-11 | Disposition: A | Discharge status: Home / Self Care | Payer: BC Managed Care – PPO | Source: Ambulatory Visit | Attending: Obstetrics and Gynecology | Admitting: Obstetrics and Gynecology

## 2021-02-11 ENCOUNTER — Ambulatory Visit: Payer: BC Managed Care – PPO | Admitting: Obstetrics and Gynecology

## 2021-02-11 VITALS — BP 128/74 | Ht 66.0 in | Wt 264.0 lb

## 2021-02-11 DIAGNOSIS — Z124 Encounter for screening for malignant neoplasm of cervix: Secondary | ICD-10-CM | POA: Insufficient documentation

## 2021-02-11 DIAGNOSIS — Z01419 Encounter for gynecological examination (general) (routine) without abnormal findings: Secondary | ICD-10-CM | POA: Diagnosis not present

## 2021-02-11 DIAGNOSIS — Z1211 Encounter for screening for malignant neoplasm of colon: Secondary | ICD-10-CM

## 2021-02-11 DIAGNOSIS — Z0000 Encounter for general adult medical examination without abnormal findings: Secondary | ICD-10-CM | POA: Diagnosis not present

## 2021-02-11 DIAGNOSIS — Z1231 Encounter for screening mammogram for malignant neoplasm of breast: Secondary | ICD-10-CM | POA: Diagnosis not present

## 2021-02-11 NOTE — Patient Instructions (Signed)
Institute [employer] Recommended Dietary Allowances for Calcium and Vitamin D  Age (yr) Calcium Recommended Dietary Allowance (mg/day) Vitamin D Recommended Dietary Allowance (international units/day)  9-18 1,300 600  19-50 1,000 600  51-70 1,200 600  71 and older 1,200 800  Data from [employer]. Dietary reference intakes: calcium, vitamin D. [place], [place]: National Academies [employer]; 2011.    Exercising to Stay Healthy To become healthy and stay healthy, it is recommended that you do moderate-intensity and vigorous-intensity exercise. You can tell that you are exercising at a moderate intensity if your heart starts beating faster and you start breathing faster but can still hold a conversation. You can tell that you are exercising at a vigorous intensity if you are breathing much harder and faster and cannot hold a conversation while exercising. Exercising regularly is important. It has many health benefits, such as:  Improving overall fitness, flexibility, and endurance.  Increasing bone density.  Helping with weight control.  Decreasing body fat.  Increasing muscle strength.  Reducing stress and tension.  Improving overall health. How often should I exercise? Choose an activity that you enjoy, and set realistic goals. Your health care provider can help you make an activity plan that works for you. Exercise regularly as told by your health care provider. This may include:  Doing strength training two times a week, such as: ? Lifting weights. ? Using resistance bands. ? Push-ups. ? Sit-ups. ? Yoga.  Doing a certain intensity of exercise for a given amount of time. Choose from these options: ? A total of 150 minutes of moderate-intensity exercise every week. ? A total of 75 minutes of vigorous-intensity exercise every week. ? A mix of moderate-intensity and vigorous-intensity exercise every week. Children, pregnant women, people who have not exercised  regularly, people who are overweight, and older adults may need to talk with a health care provider about what activities are safe to do. If you have a medical condition, be sure to talk with your health care provider before you start a new exercise program. What are some exercise ideas? Moderate-intensity exercise ideas include:  Walking 1 mile (1.6 km) in about 15 minutes.  Biking.  Hiking.  Golfing.  Dancing.  Water aerobics. Vigorous-intensity exercise ideas include:  Walking 4.5 miles (7.2 km) or more in about 1 hour.  Jogging or running 5 miles (8 km) in about 1 hour.  Biking 10 miles (16.1 km) or more in about 1 hour.  Lap swimming.  Roller-skating or in-line skating.  Cross-country skiing.  Vigorous competitive sports, such as football, basketball, and soccer.  Jumping rope.  Aerobic dancing.   What are some everyday activities that can help me to get exercise?  Yard work, such as: ? Pushing a lawn mower. ? Raking and bagging leaves.  Washing your car.  Pushing a stroller.  Shoveling snow.  Gardening.  Washing windows or floors. How can I be more active in my day-to-day activities?  Use stairs instead of an elevator.  Take a walk during your lunch break.  If you drive, park your car farther away from your work or school.  If you take public transportation, get off one stop early and walk the rest of the way.  Stand up or walk around during all of your indoor phone calls.  Get up, stretch, and walk around every 30 minutes throughout the day.  Enjoy exercise with a friend. Support to continue exercising will help you keep a regular routine of activity. What guidelines   can I follow while exercising?  Before you start a new exercise program, talk with your health care provider.  Do not exercise so much that you hurt yourself, feel dizzy, or get very short of breath.  Wear comfortable clothes and wear shoes with good support.  Drink plenty of  water while you exercise to prevent dehydration or heat stroke.  Work out until your breathing and your heartbeat get faster. Where to find more information  [place] Department of Health and Human Services: www.hhs.gov  Centers for Disease Control and Prevention (CDC): www.cdc.gov Summary  Exercising regularly is important. It will improve your overall fitness, flexibility, and endurance.  Regular exercise also will improve your overall health. It can help you control your weight, reduce stress, and improve your bone density.  Do not exercise so much that you hurt yourself, feel dizzy, or get very short of breath.  Before you start a new exercise program, talk with your health care provider. This information is not intended to replace advice given to you by your health care provider. Make sure you discuss any questions you have with your health care provider. Document Revised: 08/07/2017 Document Reviewed: 07/16/2017 Elsevier Patient Education  2021 Elsevier Inc.   Budget-Friendly Healthy Eating There are many ways to save money at the grocery store and continue to eat healthy. You can be successful if you:  Plan meals according to your budget.  Make a grocery list and only purchase food according to your grocery list.  Prepare food yourself at home. What are tips for following this plan? Reading food labels  Compare food labels between brand name foods and the store brand. Often the nutritional value is the same, but the store brand is lower cost.  Look for products that do not have added sugar, fat, or salt (sodium). These often cost the same but are healthier for you. Products may be labeled as: ? Sugar-free. ? Nonfat. ? Low-fat. ? Sodium-free. ? Low-sodium.  Look for lean ground beef labeled as at least 92% lean and 8% fat. Shopping  Buy only the items on your grocery list and go only to the areas of the store that have the items on your list.  Use coupons only for  foods and brands you normally buy. Avoid buying items you wouldn't normally buy simply because they are on sale.  Check online and in newspapers for weekly deals.  Buy healthy items from the bulk bins when available, such as herbs, spices, flour, pasta, nuts, and dried fruit.  Buy fruits and vegetables that are in season. Prices are usually lower on in-season produce.  Look at the unit price on the price tag. Use it to compare different brands and sizes to find out which item is the best deal.  Choose healthy items that are often low-cost, such as carrots, potatoes, apples, bananas, and oranges. Dried or canned beans are a low-cost protein source.  Buy in bulk and freeze extra food. Items you can buy in bulk include meats, fish, poultry, frozen fruits, and frozen vegetables.  Avoid buying "ready-to-eat" foods, such as pre-cut fruits and vegetables and pre-made salads.  If possible, shop around to discover where you can find the best prices. Consider other retailers such as dollar stores, larger wholesale stores, local fruit and vegetable stands, and farmers markets.  Do not shop when you are hungry. If you shop while hungry, it may be hard to stick to your list and budget.  Resist impulse buying. Use your grocery   list as your official plan for the week.  Buy a variety of vegetables and fruits by purchasing fresh, frozen, and canned items.  Look at the top and bottom shelves for deals. Foods at eye level (eye level of an adult or child) are usually more expensive.  Be efficient with your time when shopping. The more time you spend at the store, the more money you are likely to spend.  To save money when choosing more expensive foods like meats and dairy: ? Choose cheaper cuts of meat, such as bone-in chicken thighs and drumsticks instead of skinless and boneless chicken. When you are ready to prepare the chicken, you can remove the skin yourself to make it healthier. ? Choose lean meats  like chicken or turkey instead of beef. ? Choose canned seafood, such as tuna, salmon, or sardines. ? Buy eggs as a low-cost source of protein. ? Buy dried beans and peas, such as lentils, split peas, or kidney beans instead of meats. Dried beans and peas are a good alternative source of protein. ? Buy the larger tubs of yogurt instead of individual-sized containers.  Choose water instead of sodas and other sweetened beverages.  Avoid buying chips, cookies, and other "junk food." These items are usually expensive and not healthy.   Cooking  Make extra food and freeze the extras in meal-sized containers or in individual portions for fast meals and snacks.  Pre-cook on days when you have extra time to prepare meals in advance. You can keep these meals in the fridge or freezer and reheat for a quick meal.  When you come home from the grocery store, wash, peel, and cut fruits and vegetables so they are ready to use and eat. This will help reduce food waste. Meal planning  Do not eat out or get fast food. Prepare food at home.  Make a grocery list and make sure to bring it with you to the store. If you have a smart phone, you could use your phone to create your shopping list.  Plan meals and snacks according to a grocery list and budget you create.  Use leftovers in your meal plan for the week.  Look for recipes where you can cook once and make enough food for two meals.  Prepare budget-friendly types of meals like stews, casseroles, and stir-fry dishes.  Try some meatless meals or try "no cook" meals like salads.  Make sure that half your plate is filled with fruits or vegetables. Choose from fresh, frozen, or canned fruits and vegetables. If eating canned, remember to rinse them before eating. This will remove any excess salt added for packaging. Summary  Eating healthy on a budget is possible if you plan your meals according to your budget, purchase according to your budget and  grocery list, and prepare food yourself.  Tips for buying more food on a limited budget include buying generic brands, using coupons only for foods you normally buy, and buying healthy items from the bulk bins when available.  Tips for buying cheaper food to replace expensive food include choosing cheaper, lean cuts of meat, and buying dried beans and peas. This information is not intended to replace advice given to you by your health care provider. Make sure you discuss any questions you have with your health care provider. Document Revised: 06/07/2020 Document Reviewed: 06/07/2020 Elsevier Patient Education  2021 [employer] Inc.   Bone Health Bones protect organs, store calcium, anchor muscles, and support the whole body. Keeping your bones   strong is important, especially as you get older. You can take actions to help keep your bones strong and healthy. Why is keeping my bones healthy important? Keeping your bones healthy is important because your body constantly replaces bone cells. Cells get old, and new cells take their place. As we age, we lose bone cells because the body may not be able to make enough new cells to replace the old cells. The amount of bone cells and bone tissue you have is referred to as bone mass. The higher your bone mass, the stronger your bones. The aging process leads to an overall loss of bone mass in the body, which can increase the likelihood of:  Joint pain and stiffness.  Broken bones.  A condition in which the bones become weak and brittle (osteoporosis). A large decline in bone mass occurs in older adults. In women, it occurs about the time of menopause.   What actions can I take to keep my bones healthy? Good health habits are important for maintaining healthy bones. This includes eating nutritious foods and exercising regularly. To have healthy bones, you need to get enough of the right minerals and vitamins. Most nutrition experts recommend getting these  nutrients from the foods that you eat. In some cases, taking supplements may also be recommended. Doing certain types of exercise is also important for bone health. What are the nutritional recommendations for healthy bones? Eating a well-balanced diet with plenty of calcium and vitamin D will help to protect your bones. Nutritional recommendations vary from person to person. Ask your health care provider what is healthy for you. Here are some general guidelines. Get enough calcium Calcium is the most important (essential) mineral for bone health. Most people can get enough calcium from their diet, but supplements may be recommended for people who are at risk for osteoporosis. Good sources of calcium include:  Dairy products, such as low-fat or nonfat milk, cheese, and yogurt.  Dark green leafy vegetables, such as bok choy and broccoli.  Calcium-fortified foods, such as orange juice, cereal, bread, soy beverages, and tofu products.  Nuts, such as almonds. Follow these recommended amounts for daily calcium intake:  Children, age 1-3: 700 mg.  Children, age 4-8: 1,000 mg.  Children, age 9-13: 1,300 mg.  Teens, age 14-18: 1,300 mg.  Adults, age 19-50: 1,000 mg.  Adults, age 51-70: ? Men: 1,000 mg. ? Women: 1,200 mg.  Adults, age 71 or older: 1,200 mg.  Pregnant and breastfeeding females: ? Teens: 1,300 mg. ? Adults: 1,000 mg. Get enough vitamin D Vitamin D is the most essential vitamin for bone health. It helps the body absorb calcium. Sunlight stimulates the skin to make vitamin D, so be sure to get enough sunlight. If you live in a cold climate or you do not get outside often, your health care provider may recommend that you take vitamin D supplements. Good sources of vitamin D in your diet include:  Egg yolks.  Saltwater fish.  Milk and cereal fortified with vitamin D. Follow these recommended amounts for daily vitamin D intake:  Children and teens, age 1-18: 600  international units.  Adults, age 50 or younger: 400-800 international units.  Adults, age 51 or older: 800-1,000 international units. Get other important nutrients Other nutrients that are important for bone health include:  Phosphorus. This mineral is found in meat, poultry, dairy foods, nuts, and legumes. The recommended daily intake for adult men and adult women is 700 mg.  Magnesium. This mineral   is found in seeds, nuts, dark green vegetables, and legumes. The recommended daily intake for adult men is 400-420 mg. For adult women, it is 310-320 mg.  Vitamin K. This vitamin is found in green leafy vegetables. The recommended daily intake is 120 mg for adult men and 90 mg for adult women.   What type of physical activity is best for building and maintaining healthy bones? Weight-bearing and strength-building activities are important for building and maintaining healthy bones. Weight-bearing activities cause muscles and bones to work against gravity. Strength-building activities increase the strength of the muscles that support bones. Weight-bearing and muscle-building activities include:  Walking and hiking.  Jogging and running.  Dancing.  Gym exercises.  Lifting weights.  Tennis and racquetball.  Climbing stairs.  Aerobics. Adults should get at least 30 minutes of moderate physical activity on most days. Children should get at least 60 minutes of moderate physical activity on most days. Ask your health care provider what type of exercise is best for you.   How can I find out if my bone mass is low? Bone mass can be measured with an X-ray test called a bone mineral density (BMD) test. This test is recommended for all women who are age 65 or older. It may also be recommended for:  Men who are age 70 or older.  People who are at risk for osteoporosis because of: ? Having bones that break easily. ? Having a long-term disease that weakens bones, such as kidney disease or  rheumatoid arthritis. ? Having menopause earlier than normal. ? Taking medicine that weakens bones, such as steroids, thyroid hormones, or hormone treatment for breast cancer or prostate cancer. ? Smoking. ? Drinking three or more alcoholic drinks a day. If you find that you have a low bone mass, you may be able to prevent osteoporosis or further bone loss by changing your diet and lifestyle. Where can I find more information? For more information, check out the following websites:  National Osteoporosis Foundation: www.nof.org/patients  National Institutes of Health: www.bones.nih.gov  International Osteoporosis Foundation: www.iofbonehealth.org Summary  The aging process leads to an overall loss of bone mass in the body, which can increase the likelihood of broken bones and osteoporosis.  Eating a well-balanced diet with plenty of calcium and vitamin D will help to protect your bones.  Weight-bearing and strength-building activities are also important for building and maintaining strong bones.  Bone mass can be measured with an X-ray test called a bone mineral density (BMD) test. This information is not intended to replace advice given to you by your health care provider. Make sure you discuss any questions you have with your health care provider. Document Revised: 09/21/2017 Document Reviewed: 09/21/2017 Elsevier Patient Education  2021 [employer] Inc.   

## 2021-02-11 NOTE — Progress Notes (Signed)
Gynecology Annual Exam  PCP: Idelle Crouch, MD  Chief Complaint: No chief complaint on file.   History of Present Illness: Patient is a 65 y.o. [id number] presents for annual exam. The patient has no complaints today.   LMP: No LMP recorded. Patient is postmenopausal. She denies postmenopausal bleeding or spotting   The patient does perform self breast exams.  There is no notable family history of breast or ovarian cancer in her family.  The patient has regular exercise: no, difficulty with back and hips  The patient denies current symptoms of depression.   Review of Systems: ROS  Past Medical History:  Past Medical History:  Diagnosis Date  . ADD (attention deficit disorder)   . Anemia    iron deficiency, b12, taking iron  . Anxiety   . Barrett's esophagus 2019  . Cold sore   . Colon polyps   . COPD (chronic obstructive pulmonary disease) ([place])   . Diverticulosis   . Dyspnea    on exertion  . Environmental allergies   . Essential hypertension   . Genital herpes 05/2019   neg culture, positive type 2 IgG.  . GERD (gastroesophageal reflux disease)   . History of hiatal hernia   . History of nerve impingement    Sees Dr. Holley Raring, Pain Management  . Hyperlipidemia   . Lichen sclerosus   . Obesity   . Osteoarthritis    hands, feet, knees  . Other abnormal auditory perceptions, bilateral 2019   hip and back issues  . RLS (restless legs syndrome)   . Shingles 09/2018  . Sleep apnea    CPAP  . Vitamin B12 deficiency   . Weakness of back    right side back and leg nerves burned / now burning left side  . Wears contact lenses     Past Surgical History:  Past Surgical History:  Procedure Laterality Date  . Barbie Banner OSTEOTOMY Left 08/11/2019   Procedure: PHALANX OSTEOTOMY AKIN;  Surgeon: Albertine Patricia, DPM;  Location: [place];  Service: Podiatry;  Laterality: Left;  . BONE EXCISION Left 08/11/2019   Procedure: SADDLEBONE;  Surgeon: Albertine Patricia, DPM;  Location: [facility];  Service: Podiatry;  Laterality: Left;  sleep apnea  . BREAST CYST ASPIRATION Right 1998   neg  . broken leg repair Right   . BUNIONECTOMY Right 2010   pins in toes of right foot  . COLONOSCOPY  2014  . COLONOSCOPY WITH PROPOFOL N/A 05/24/2018   Procedure: COLONOSCOPY WITH PROPOFOL;  Surgeon: Lollie Sails, MD;  [facility] ENDOSCOPY;  Service: Endoscopy;  Laterality: N/A;  . ESOPHAGOGASTRODUODENOSCOPY (EGD) WITH PROPOFOL N/A 05/24/2018   Procedure: ESOPHAGOGASTRODUODENOSCOPY (EGD) WITH PROPOFOL;  Surgeon: Lollie Sails, MD;  Location: [facility] ENDOSCOPY;  Service: Endoscopy;  Laterality: N/A;  . eye plugs Bilateral    plugs placed for dry eyes  . HALLUX VALGUS AUSTIN Left 08/11/2019   Procedure: AUSTIN (MITCHELL);  Surgeon: Albertine Patricia, DPM;  Location: [place];  Service: Podiatry;  Laterality: Left;  . INDUCED ABORTION    . JOINT REPLACEMENT Right    hip  . ORIF TOE FRACTURE Left 09/22/2019   Procedure: REMOVAL OF SCREWS WITH OPEN REDUCTION INTERNAL FIXATION (ORIF) METATARSAL (GREAT TOE);  Surgeon: Albertine Patricia, DPM;  Location: [place];  Service: Podiatry;  Laterality: Left;  pt's splint was removed and cast was applied in PACU by MD  . PLANTAR FASCIA SURGERY Left 2005  . TOTAL HIP ARTHROPLASTY Right  08/11/2018   Procedure: TOTAL HIP ARTHROPLASTY;  Surgeon: Dereck Leep, MD;  Location: [facility] ORS;  Service: Orthopedics;  Laterality: Right;    Gynecologic History:  No LMP recorded. Patient is postmenopausal. Last Pap: Results were: 2019 NIL  Last mammogram: 03/07/2020 Results were: BI-RAD I  Obstetric History: [id number]  Family History:  Family History  Problem Relation Age of Onset  . Breast cancer Cousin 64       mat second cousin  . Liver cancer Maternal Aunt   . Alcoholism Mother   . Hypertension Mother   . Diabetes Mother        type 1  . Congestive Heart Failure Mother   . Obesity Mother    . Diabetes Maternal Grandmother        type 2  . Breast cancer Paternal Grandmother 26  . Arthritis Sister   . Diabetes Sister   . Stroke Brother 25  . Alcoholism Maternal Grandfather     Social History:  Social History   Socioeconomic History  . Marital status: Single    Spouse name: s.o. michael  . Number of children: 2  . Years of education: 71  . Highest education level: Not on file  Occupational History  . Occupation: Surveyor, minerals    Comment: school Network engineer  Tobacco Use  . Smoking status: Former Smoker    Packs/day: 0.50    Years: 10.00    Pack years: 5.00    Types: Cigarettes    Quit date: 07/22/2016    Years since quitting: 4.5  . Smokeless tobacco: Never Used  Vaping Use  . Vaping Use: Never used  Substance and Sexual Activity  . Alcohol use: Yes    Alcohol/week: 1.0 - 4.0 standard drink    Types: 1 - 2 Shots of liquor per week    Comment: occasional  . Drug use: No  . Sexual activity: Not Currently    Partners: Male    Birth control/protection: Post-menopausal  Other Topics Concern  . Not on file  Social History Narrative  . Not on file   Social Determinants of Health   Financial Resource Strain: Not on file  Food Insecurity: Not on file  Transportation Needs: Not on file  Physical Activity: Not on file  Stress: Not on file  Social Connections: Not on file  Intimate Partner Violence: Not on file    Allergies:  No Known Allergies  Medications: Prior to Admission medications   Medication Sig Start Date End Date Taking? Authorizing Provider  ALPRAZolam Duanne Moron) 1 MG tablet Take by mouth. 04/02/20   [provider]  budesonide-formoterol (SYMBICORT) 160-4.5 MCG/ACT inhaler Inhale 2 puffs into the lungs 2 (two) times daily. Am and lunch    [provider]  cholecalciferol (VITAMIN D3) 25 MCG (1000 UT) tablet Take 1,000 Units by mouth every 3 (three) days.    [provider]  clobetasol ointment (TEMOVATE) 0.05 %  Apply vaginally twice a night for next 8 weeks 08/21/20   Kerington Hildebrant R, MD  cyanocobalamin (,VITAMIN B-12,) 1000 MCG/ML injection Inject into the muscle every 30 (thirty) days.  08/02/18   [provider]  cyclobenzaprine (FLEXERIL) 10 MG tablet Take 10 mg by mouth daily as needed for muscle spasms.     [provider]  diclofenac (VOLTAREN) 75 MG EC tablet Take 1 tablet (75 mg total) by mouth 2 (two) times daily as needed. 11/07/19   Gillis Santa, MD  DULoxetine (CYMBALTA) 30 MG capsule Take 1  capsule (30 mg total) by mouth daily. 11/07/19 01/06/20  Gillis Santa, MD  ferrous sulfate 325 (65 FE) MG tablet Take 325 mg by mouth 2 (two) times daily with a meal.  01/12/17   [provider]  gabapentin (NEURONTIN) 300 MG capsule TAKE 1 CAPSULE BY MOUTH THREE TIMES A DAY 05/30/19   [provider]  hydrochlorothiazide (HYDRODIURIL) 25 MG tablet Take 25 mg by mouth daily. am 01/21/17   [provider]  ibuprofen (ADVIL) 600 MG tablet Take 1 tablet (600 mg total) by mouth every 6 (six) hours as needed. 02/27/20   Elloise Roark R, MD  lidocaine (XYLOCAINE) 5 % ointment Apply 1 application topically 2 (two) times daily as needed. 11/13/20   Tavio Biegel, Stefanie Libel, MD  lovastatin (MEVACOR) 40 MG tablet Take 40 mg by mouth at bedtime. 01/25/17   [provider]  Melatonin 3 MG CAPS Take by mouth at bedtime.    [provider]  mometasone (ELOCON) 0.1 % cream APPLY 1 APPLICATION TOPICALLY DAILY AS NEEDED (RASH). 09/04/20   Ralene Bathe, MD  pantoprazole (PROTONIX) 40 MG tablet Take 40 mg by mouth daily. am 04/20/18   [provider]  polyethylene glycol (MIRALAX / GLYCOLAX) packet Take 17 g by mouth at bedtime.    [provider]  sucralfate (CARAFATE) 1 g tablet Take 1 g by mouth 2 (two) times daily.  05/26/18   [provider]  valACYclovir (VALTREX) 500 MG tablet TAKE 1 TABLET BY MOUTH EVERY DAY 08/07/20   Homero Fellers, MD    Physical Exam Vitals: There were no vitals taken for this visit.  Physical Exam Constitutional:      Appearance: She is well-developed.  Genitourinary:     Genitourinary Comments: External: Normal appearing vulva.Stable lichen sclerosis. Speculum examination: Normal appearing cervix. No blood in the vaginal vault. no discharge.   Bimanual examination: Uterus midline, non-tender, normal in size, shape and contour.  No CMT. No adnexal masses. No adnexal tenderness. Pelvis not fixed.  Breast Exam: breast equal without skin changes, nipple discharge, breast lump or enlarged lymph nodes   HENT:     Head: Normocephalic and atraumatic.  Neck:     Thyroid: No thyromegaly.  Cardiovascular:     Rate and Rhythm: Normal rate and regular rhythm.     Heart sounds: Normal heart sounds.  Pulmonary:     Effort: Pulmonary effort is normal.     Breath sounds: Normal breath sounds.  Abdominal:     General: Bowel sounds are normal. There is no distension.     Palpations: Abdomen is soft. There is no mass.  Musculoskeletal:     Cervical back: Neck supple.  Neurological:     Mental Status: She is alert and oriented to person, place, and time.  Skin:    General: Skin is warm and dry.  Psychiatric:        Behavior: Behavior normal.        Thought Content: Thought content normal.        Judgment: Judgment normal.  Vitals reviewed.      Female chaperone present for pelvic and breast  portions of the physical exam  Assessment: 65 y.o. [id number] routine annual exam  Plan: Problem List Items Addressed This Visit   None   Visit Diagnoses    Encounter for annual routine gynecological examination    -  Primary   Health maintenance examination       Cervical cancer screening  Relevant Orders   Cytology - PAP   Breast cancer screening by mammogram          1) Mammogram - recommend yearly screening mammogram.  Mammogram is scheduled.   2) STI screening was offered  and declined  3) ASCCP guidelines and rational discussed. Pap performed today  4) Colonoscopy -- up to date.   5) Routine healthcare maintenance including cholesterol, diabetes screening discussed managed by PCP. Reviewed ACVD risk of 8.3% over next 3 years. Patient taking a statin, discussed optimizing LDL. Discussed staring a 81 mg ASA.  Discussed exercise and healthy diet.  6) Osteoporosis screening - not at increased risk, start routine screening at 65.   7) Encouraged shingles and pneumonia vaccination.   Adrian Prows MD, [facility], [facility] 02/11/2021 2:44 PM

## 2021-02-13 LAB — CYTOLOGY - PAP
Comment: NEGATIVE
Diagnosis: NEGATIVE
High risk HPV: NEGATIVE

## 2021-02-15 ENCOUNTER — Encounter: Payer: Self-pay | Admitting: Orthopaedic Surgery

## 2021-02-20 ENCOUNTER — Ambulatory Visit: Payer: BC Managed Care – PPO | Admitting: Orthopaedic Surgery

## 2021-02-26 ENCOUNTER — Encounter: Payer: Self-pay | Admitting: Dermatology

## 2021-02-26 ENCOUNTER — Other Ambulatory Visit: Payer: Self-pay

## 2021-02-26 ENCOUNTER — Ambulatory Visit: Payer: BC Managed Care – PPO | Admitting: Dermatology

## 2021-02-26 DIAGNOSIS — R21 Rash and other nonspecific skin eruption: Secondary | ICD-10-CM | POA: Diagnosis not present

## 2021-02-26 DIAGNOSIS — H0236 Blepharochalasis left eye, unspecified eyelid: Secondary | ICD-10-CM | POA: Diagnosis not present

## 2021-02-26 MED ORDER — HYDROCORTISONE 2.5 % EX CREA: TOPICAL_CREAM | Freq: Two times a day (BID) | CUTANEOUS | 0 refills | Status: AC | PRN

## 2021-02-26 NOTE — Patient Instructions (Addendum)
Start HC 2.5% cream twice daily for up to 1 week.   Topical steroids (such as triamcinolone, fluocinolone, fluocinonide, mometasone, clobetasol, halobetasol, betamethasone, hydrocortisone) can cause thinning and lightening of the skin if they are used for too long in the same area. Your physician has selected the right strength medicine for your problem and area affected on the body. Please use your medication only as directed by your physician to prevent side effects.   If you have any questions or concerns for your doctor, please call our main line at [phone] and press option 4 to reach your doctor's medical assistant. If no one answers, please leave a voicemail as directed and we will return your call as soon as possible. Messages left after 4 pm will be answered the following business day.   You may also send [place] a message via [place]. We typically respond to MyChart messages within 1-2 business days.  For prescription refills, please ask your pharmacy to contact our office. Our fax number is [phone].  If you have an urgent issue when the clinic is closed that cannot wait until the next business day, you can page your doctor at the number below.    Please note that while we do our best to be available for urgent issues outside of office hours, we are not available 24/7.   If you have an urgent issue and are unable to reach [place], you may choose to seek medical care at your doctor's office, retail clinic, urgent care center, or emergency room.  If you have a medical emergency, please immediately call 911 or go to the emergency department.  Pager Numbers  - Dr. Nehemiah Massed: [phone]  - Dr. Laurence Ferrari: [phone]  - Dr. Nicole Kindred: [phone]  In the event of inclement weather, please call our main line at [phone] for an update on the status of any delays or closures.  Dermatology Medication Tips: Please keep the boxes that topical medications come in in order to help keep track  of the instructions about where and how to use these. Pharmacies typically print the medication instructions only on the boxes and not directly on the medication tubes.   If your medication is too expensive, please contact our office at [phone] option 4 or send [place] a message through [place].   We are unable to tell what your co-pay for medications will be in advance as this is different depending on your insurance coverage. However, we may be able to find a substitute medication at lower cost or fill out paperwork to get insurance to cover a needed medication.   If a prior authorization is required to get your medication covered by your insurance company, please allow [place] 1-2 business days to complete this process.  Drug prices often vary depending on where the prescription is filled and some pharmacies may offer cheaper prices.  The website www.goodrx.com contains coupons for medications through different pharmacies. The prices here do not account for what the cost may be with help from insurance (it may be cheaper with your insurance), but the website can give you the price if you did not use any insurance.  - You can print the associated coupon and take it with your prescription to the pharmacy.  - You may also stop by our office during regular business hours and pick up a GoodRx coupon card.  - If you need your prescription sent electronically to a different pharmacy, notify our office through [place] or by phone at [phone] option  4.  Recommend consultation with oculoplastic surgeon for eyelids.  [email or web address]

## 2021-02-26 NOTE — Progress Notes (Signed)
   Follow-Up Visit   Subjective  Mackenzie [name] is a 65 y.o. female who presents for the following: Skin Problem (Patient used Neutrogena Rapid Wrinkle Prepare retinol oil on her eyelids on Sunday. She developed swelling, itching and burning. Skin at eyelids is chafed. ).  The following portions of the chart were reviewed this encounter and updated as appropriate:   Tobacco  Allergies  Meds  Problems  Med Hx  Surg Hx  Fam Hx       Review of Systems:  No other skin or systemic complaints except as noted in HPI or Assessment and Plan.  Objective  Well appearing patient in no apparent distress; mood and affect are within normal limits.  A focused examination was performed including face. Relevant physical exam findings are noted in the Assessment and Plan.  eyelids Erythematous patches   Assessment & Plan  Rash eyelids  Irritant contact dermatitis from retinol/salicylic acid  Start HC 8.8% cream twice daily for up to 1 week.   Topical steroids (such as triamcinolone, fluocinolone, fluocinonide, mometasone, clobetasol, halobetasol, betamethasone, hydrocortisone) can cause thinning and lightening of the skin if they are used for too long in the same area. Your physician has selected the right strength medicine for your problem and area affected on the body. Please use your medication only as directed by your physician to prevent side effects.    hydrocortisone 2.5 % cream - eyelids Apply topically 2 (two) times daily as needed (Rash). Twice daily for up to 1 week to eyelids as needed for rash.  Excess skin of left eyelid bilateral eyelids  Recommend consultation with oculoplastic surgeon to consider blepharoplasty   Return if symptoms worsen or fail to improve.  Graciella Belton, RMA, am acting as scribe for Forest Gleason, MD .  Documentation: I have reviewed the above documentation for accuracy and completeness, and I agree with the above.  Forest Gleason, MD

## 2021-02-27 ENCOUNTER — Ambulatory Visit: Payer: BC Managed Care – PPO | Admitting: Dermatology

## 2021-02-28 ENCOUNTER — Other Ambulatory Visit: Payer: Self-pay | Admitting: Obstetrics and Gynecology

## 2021-02-28 DIAGNOSIS — N9089 Other specified noninflammatory disorders of vulva and perineum: Secondary | ICD-10-CM

## 2021-03-07 ENCOUNTER — Other Ambulatory Visit: Payer: Self-pay

## 2021-03-07 ENCOUNTER — Ambulatory Visit
Admission: RE | Admit: 2021-03-07 | Discharge: 2021-03-07 | Disposition: A | Discharge status: Home / Self Care | Payer: BC Managed Care – PPO | Source: Ambulatory Visit | Attending: Internal Medicine | Admitting: Internal Medicine

## 2021-03-07 DIAGNOSIS — Z1231 Encounter for screening mammogram for malignant neoplasm of breast: Secondary | ICD-10-CM | POA: Diagnosis not present

## 2021-03-14 ENCOUNTER — Other Ambulatory Visit: Payer: Self-pay | Admitting: Internal Medicine

## 2021-03-14 DIAGNOSIS — R928 Other abnormal and inconclusive findings on diagnostic imaging of breast: Secondary | ICD-10-CM

## 2021-03-14 DIAGNOSIS — N6320 Unspecified lump in the left breast, unspecified quadrant: Secondary | ICD-10-CM

## 2021-03-19 ENCOUNTER — Ambulatory Visit
Admission: RE | Admit: 2021-03-19 | Discharge: 2021-03-19 | Disposition: A | Discharge status: Home / Self Care | Payer: BC Managed Care – PPO | Source: Ambulatory Visit | Attending: Internal Medicine | Admitting: Internal Medicine

## 2021-03-19 ENCOUNTER — Other Ambulatory Visit: Payer: Self-pay

## 2021-03-19 DIAGNOSIS — N6320 Unspecified lump in the left breast, unspecified quadrant: Secondary | ICD-10-CM

## 2021-03-19 DIAGNOSIS — R928 Other abnormal and inconclusive findings on diagnostic imaging of breast: Secondary | ICD-10-CM

## 2021-03-20 ENCOUNTER — Other Ambulatory Visit: Payer: Self-pay | Admitting: Internal Medicine

## 2021-03-20 DIAGNOSIS — N6320 Unspecified lump in the left breast, unspecified quadrant: Secondary | ICD-10-CM

## 2021-04-03 ENCOUNTER — Ambulatory Visit: Payer: BC Managed Care – PPO | Admitting: Orthopaedic Surgery

## 2021-04-16 ENCOUNTER — Ambulatory Visit: Payer: BC Managed Care – PPO | Admitting: Orthopaedic Surgery

## 2021-05-22 ENCOUNTER — Ambulatory Visit: Payer: BC Managed Care – PPO | Admitting: Orthopaedic Surgery

## 2021-05-27 ENCOUNTER — Other Ambulatory Visit: Payer: Self-pay

## 2021-05-27 ENCOUNTER — Ambulatory Visit: Payer: BC Managed Care – PPO | Admitting: Orthopaedic Surgery

## 2021-05-27 ENCOUNTER — Encounter: Payer: Self-pay | Admitting: Orthopaedic Surgery

## 2021-05-27 VITALS — Ht 66.0 in | Wt 264.0 lb

## 2021-05-27 DIAGNOSIS — M2170 Unequal limb length (acquired), unspecified site: Secondary | ICD-10-CM | POA: Diagnosis not present

## 2021-05-27 DIAGNOSIS — Z96641 Presence of right artificial hip joint: Secondary | ICD-10-CM | POA: Diagnosis not present

## 2021-05-27 DIAGNOSIS — M25551 Pain in right hip: Secondary | ICD-10-CM | POA: Diagnosis not present

## 2021-05-27 NOTE — Progress Notes (Signed)
The patient comes in again today with her husband as a relates to a painful right total hip arthroplasty that was done through a posterior approach in [place] in 2019.  She has a long fully porous-coated femoral component and there is also leg length discrepancy from where that femoral component had subsided.  A three-phase bone scan last year showed no evidence of prosthetic loosening.  Plain films were concerning for a little bit of a halo effect around the shoulder of the prosthesis.  She does have a leg length discrepancy when I have her lay in a supine position and she is just a little shorter on the right than the left and has to wear shoe insert.  This is frustrating to her.  She is also been dealing with sciatica and her back specialist wants to hold off any type of surgery until her right hip is addressed.  Today her BMI is 42.61.  It was about the same at her last visit 6 weeks ago.  She is also had COVID since then as well as has her husband.  They have recovered from that.  I talked her in length in detail about her right hip.  I would like to see her back in 6 weeks for repeat weight and BMI calculation.  I will send her to physical therapy in [place] with Nicole Kindred physical therapy to consider any modalities that can strengthen both her hips as well as decrease her sciatic and back pain.  We will see her back in 4 weeks with a new weight and BMI calculation.  At that point I will likely order a new three-phase bone scan and potentially even a MRI of the right hip as well to truly assess for prosthetic loosening.  If there is no evidence of loosening of the prosthesis, we can improve her leg length through an anterior approach with increasing the hip ball size.  However, if the femoral component is loose, we would need to send her to a hip revision specialist who performs posterior hip surgery which I do not given the fact that she has a long fully porous-coated stem and that surgery would be  certainly quite difficult in my hands.

## 2021-07-08 ENCOUNTER — Other Ambulatory Visit: Payer: Self-pay

## 2021-07-08 ENCOUNTER — Ambulatory Visit: Payer: BC Managed Care – PPO | Admitting: Orthopaedic Surgery

## 2021-07-08 ENCOUNTER — Encounter: Payer: Self-pay | Admitting: Orthopaedic Surgery

## 2021-07-08 VITALS — Ht 66.0 in | Wt 258.4 lb

## 2021-07-08 DIAGNOSIS — M2170 Unequal limb length (acquired), unspecified site: Secondary | ICD-10-CM

## 2021-07-08 DIAGNOSIS — M25551 Pain in right hip: Secondary | ICD-10-CM

## 2021-07-08 DIAGNOSIS — Z96641 Presence of right artificial hip joint: Secondary | ICD-10-CM

## 2021-07-08 MED ORDER — BACLOFEN 10 MG PO TABS: 10.0000 mg | ORAL_TABLET | Freq: Three times a day (TID) | ORAL | 1 refills | Status: DC | PRN

## 2021-07-08 NOTE — Progress Notes (Signed)
She continues to deal with significant right hip pain following a total hip arthroplasty that was done in [facility] in 2019.  This was done through a posterior approach.  She has a significant leg length discrepancy and this is greatly affected her back detrimentally in terms of she is developing worsening sciatica.  She does see a spine specialist for that and she was sent to me to get a good idea of what we can do for her hip.  I have seen her twice for this before.  She does have a significant leg length discrepancy on my exam.  She is still dealing with significant sciatica.  Her left hip moves smoothly as does her right operative hip but her right hip is more painful.  And she still has a leg length discrepancy.  This point she needs a three-phase bone scan to rule out prosthetic loosening of the right hip replacement as well as a MRI of the right hip to assess for fluid collections or other worrisome features of the hip replacement that would cause [place] to address this differently.  I will try some baclofen for her instead of Flexeril and we will see her back after the studies.  I believe at this standpoint the studies are medically necessary to help determine the best treatment plan for her as it relates to her hip replacement and the pain from her hip replacement on the right side.

## 2021-07-16 ENCOUNTER — Encounter (HOSPITAL_COMMUNITY)
Admission: RE | Admit: 2021-07-16 | Discharge: 2021-07-16 | Disposition: A | Discharge status: Home / Self Care | Payer: BC Managed Care – PPO | Source: Ambulatory Visit | Attending: Orthopaedic Surgery | Admitting: Orthopaedic Surgery

## 2021-07-16 ENCOUNTER — Ambulatory Visit (HOSPITAL_COMMUNITY)
Admission: RE | Admit: 2021-07-16 | Discharge: 2021-07-16 | Disposition: A | Discharge status: Home / Self Care | Payer: BC Managed Care – PPO | Source: Ambulatory Visit | Attending: Orthopaedic Surgery | Admitting: Orthopaedic Surgery

## 2021-07-16 ENCOUNTER — Other Ambulatory Visit: Payer: Self-pay

## 2021-07-16 DIAGNOSIS — Z96641 Presence of right artificial hip joint: Secondary | ICD-10-CM | POA: Diagnosis present

## 2021-07-16 DIAGNOSIS — M2170 Unequal limb length (acquired), unspecified site: Secondary | ICD-10-CM | POA: Diagnosis present

## 2021-07-16 DIAGNOSIS — M25551 Pain in right hip: Secondary | ICD-10-CM | POA: Diagnosis present

## 2021-07-16 MED ORDER — TECHNETIUM TC 99M MEDRONATE IV KIT
20.0000 | PACK | Freq: Once | INTRAVENOUS | Status: AC | PRN
  Administered 2021-07-16: 21.8 via INTRAVENOUS

## 2021-07-17 ENCOUNTER — Other Ambulatory Visit (HOSPITAL_COMMUNITY): Payer: BC Managed Care – PPO

## 2021-07-17 ENCOUNTER — Ambulatory Visit (HOSPITAL_COMMUNITY): Payer: BC Managed Care – PPO

## 2021-07-27 ENCOUNTER — Ambulatory Visit
Admission: RE | Admit: 2021-07-27 | Discharge: 2021-07-27 | Disposition: A | Discharge status: Home / Self Care | Payer: BC Managed Care – PPO | Source: Ambulatory Visit | Attending: Orthopaedic Surgery | Admitting: Orthopaedic Surgery

## 2021-07-27 ENCOUNTER — Other Ambulatory Visit: Payer: BC Managed Care – PPO

## 2021-07-27 ENCOUNTER — Other Ambulatory Visit: Payer: Self-pay

## 2021-07-27 DIAGNOSIS — Z96641 Presence of right artificial hip joint: Secondary | ICD-10-CM

## 2021-07-27 DIAGNOSIS — M2170 Unequal limb length (acquired), unspecified site: Secondary | ICD-10-CM

## 2021-07-27 DIAGNOSIS — M25551 Pain in right hip: Secondary | ICD-10-CM

## 2021-07-31 ENCOUNTER — Ambulatory Visit: Payer: BC Managed Care – PPO | Admitting: Orthopaedic Surgery

## 2021-08-12 ENCOUNTER — Ambulatory Visit: Payer: BC Managed Care – PPO | Admitting: Orthopaedic Surgery

## 2021-08-12 ENCOUNTER — Encounter: Payer: Self-pay | Admitting: Orthopaedic Surgery

## 2021-08-12 DIAGNOSIS — M25551 Pain in right hip: Secondary | ICD-10-CM

## 2021-08-12 DIAGNOSIS — Z96641 Presence of right artificial hip joint: Secondary | ICD-10-CM | POA: Diagnosis not present

## 2021-08-12 NOTE — Progress Notes (Signed)
The patient is a 65 year old female who comes in again today for an opinion as it relates to her right hip.  She had a very successful right posterior hip replacement done by one of my colleagues in [place] who is a very good Psychologist, sport and exercise and has a good reputation.  She has a long fully porous-coated femoral component and it did subside surprisingly causing her leg length discrepancy.  This is also affected some chronic back issues that she has.  She does ambulate with a walking stick and does have a ligament discrepancy and have seen her in the office before assessing this.  Plain films were suggestive of possible prosthetic loosening.  Usually though a fully porous-coated stem tends to do well.  I sent her for MRI and a three-phase bone scan at this point to further assess the prosthesis.  The MRI showed only scatter and artifact but no worrisome fluid collections.  The three-phase bone scan did show uptake around the shoulder the prosthesis and distal suggesting aseptic loosening.  I did go over the imaging studies and showed the studies to the patient and her husband.  My biggest concern is the fact that the femoral prosthesis may be loose and I do not have experience with posterior hip surgery at all nor revision of this type of fully porous-coated stem.  If it was only ligament discrepancy and there was no prosthetic loosening at all, I have performed surgery through an anterior approach with just changing the liner and increasing the hip ball length and have been successful performing that type of surgery.  However I would not be comfortable getting into the hip and anterior approach and finding that the fully porous-coated stem is loose because a revision through the front in that situation would be incredibly difficult and I have no experience with that at all nor did I experience with posterior hip surgery.  I have recommended that she have follow-up with her treating orthopedic surgeon to look at other  treatment options for revision surgery.  All question concerns were answered and addressed.  Again, I do feel that that surgeon did the absolute appropriate surgery from the get go and unfortunately that stem somehow subsided which is incredibly rare.

## 2021-08-13 ENCOUNTER — Ambulatory Visit: Payer: BC Managed Care – PPO | Admitting: Obstetrics and Gynecology

## 2021-08-14 ENCOUNTER — Other Ambulatory Visit: Payer: Self-pay | Admitting: Obstetrics and Gynecology

## 2021-08-14 DIAGNOSIS — A6004 Herpesviral vulvovaginitis: Secondary | ICD-10-CM

## 2021-08-15 ENCOUNTER — Ambulatory Visit: Payer: BC Managed Care – PPO | Admitting: Obstetrics and Gynecology

## 2021-08-29 ENCOUNTER — Encounter: Payer: Self-pay | Admitting: Obstetrics and Gynecology

## 2021-08-29 ENCOUNTER — Other Ambulatory Visit: Payer: Self-pay

## 2021-08-29 ENCOUNTER — Ambulatory Visit: Payer: BC Managed Care – PPO | Admitting: Obstetrics and Gynecology

## 2021-08-29 VITALS — BP 126/72 | Ht 67.0 in | Wt 250.0 lb

## 2021-08-29 DIAGNOSIS — B372 Candidiasis of skin and nail: Secondary | ICD-10-CM | POA: Diagnosis not present

## 2021-08-29 DIAGNOSIS — N6320 Unspecified lump in the left breast, unspecified quadrant: Secondary | ICD-10-CM

## 2021-08-29 DIAGNOSIS — L900 Lichen sclerosus et atrophicus: Secondary | ICD-10-CM | POA: Diagnosis not present

## 2021-08-29 MED ORDER — NYSTATIN 100000 UNIT/GM EX POWD: 1.0000 "application " | Freq: Three times a day (TID) | CUTANEOUS | 0 refills | Status: DC

## 2021-08-29 NOTE — Progress Notes (Signed)
Patient ID: Mackenzie [name], female   DOB: 1956/08/23, 65 y.o.   MRN: [id number]  Reason for Consult: Gynecologic Exam   Referred by Idelle Crouch, MD  Subjective:     HPI:  Mackenzie [name] is a 65 y.o. female she is following up regarding lichen sclerosus today.  Past Medical History:  Diagnosis Date   ADD (attention deficit disorder)    Anemia    iron deficiency, b12, taking iron   Anxiety    Barrett's esophagus 2019   Cold sore    Colon polyps    COPD (chronic obstructive pulmonary disease) (HCC)    Diverticulosis    Dyspnea    on exertion   Environmental allergies    Essential hypertension    Genital herpes 05/2019   neg culture, positive type 2 IgG.   GERD (gastroesophageal reflux disease)    History of hiatal hernia    History of nerve impingement    Sees Dr. Holley Raring, Pain Management   Hyperlipidemia    Lichen sclerosus    Obesity    Osteoarthritis    hands, feet, knees   Other abnormal auditory perceptions, bilateral 2019   hip and back issues   RLS (restless legs syndrome)    Shingles 09/2018   Sleep apnea    CPAP   Vitamin B12 deficiency    Weakness of back    right side back and leg nerves burned / now burning left side   Wears contact lenses    Family History  Problem Relation Age of Onset   Breast cancer Cousin 10       mat second cousin   Liver cancer Maternal Aunt    Alcoholism Mother    Hypertension Mother    Diabetes Mother        type 1   Congestive Heart Failure Mother    Obesity Mother    Diabetes Maternal Grandmother        type 2   Breast cancer Paternal Grandmother 63   Arthritis Sister    Diabetes Sister    Stroke Brother 24   Alcoholism Maternal Grandfather    Past Surgical History:  Procedure Laterality Date   Barbie Banner OSTEOTOMY Left 08/11/2019   Procedure: PHALANX OSTEOTOMY AKIN;  Surgeon: Albertine Patricia, DPM;  Location: [place];  Service: Podiatry;  Laterality: Left;   BONE EXCISION Left 08/11/2019    Procedure: SADDLEBONE;  Surgeon: Albertine Patricia, DPM;  Location: [place];  Service: Podiatry;  Laterality: Left;  sleep apnea   BREAST CYST ASPIRATION Right 1998   neg   broken leg repair Right    BUNIONECTOMY Right 2010   pins in toes of right foot   COLONOSCOPY  2014   COLONOSCOPY WITH PROPOFOL N/A 05/24/2018   Procedure: COLONOSCOPY WITH PROPOFOL;  Surgeon: Lollie Sails, MD;  Location: [facility] ENDOSCOPY;  Service: Endoscopy;  Laterality: N/A;   ESOPHAGOGASTRODUODENOSCOPY (EGD) WITH PROPOFOL N/A 05/24/2018   Procedure: ESOPHAGOGASTRODUODENOSCOPY (EGD) WITH PROPOFOL;  Surgeon: Lollie Sails, MD;  Location: [facility] ENDOSCOPY;  Service: Endoscopy;  Laterality: N/A;   eye plugs Bilateral    plugs placed for dry eyes   HALLUX VALGUS AUSTIN Left 08/11/2019   Procedure: AUSTIN (MITCHELL);  Surgeon: Albertine Patricia, DPM;  Location: [place];  Service: Podiatry;  Laterality: Left;   INDUCED ABORTION     JOINT REPLACEMENT Right    hip   ORIF TOE FRACTURE Left 09/22/2019   Procedure: REMOVAL OF SCREWS WITH OPEN  REDUCTION INTERNAL FIXATION (ORIF) METATARSAL (GREAT TOE);  Surgeon: Albertine Patricia, DPM;  Location: [place];  Service: Podiatry;  Laterality: Left;  pt's splint was removed and cast was applied in PACU by MD   PLANTAR FASCIA SURGERY Left 2005   TOTAL HIP ARTHROPLASTY Right 08/11/2018   Procedure: TOTAL HIP ARTHROPLASTY;  Surgeon: Dereck Leep, MD;  Location: [facility];  Service: Orthopedics;  Laterality: Right;    Short Social History:  Social History   Tobacco Use   Smoking status: Former    Packs/day: 0.50    Years: 10.00    Pack years: 5.00    Types: Cigarettes    Quit date: 07/22/2016    Years since quitting: 5.1   Smokeless tobacco: Never  Substance Use Topics   Alcohol use: Yes    Alcohol/week: 1.0 - 4.0 standard drink    Types: 1 - 2 Shots of liquor per week    Comment: occasional    No Known Allergies  Current Outpatient  Medications  Medication Sig Dispense Refill   ALPRAZolam (XANAX) 1 MG tablet Take by mouth.     baclofen (LIORESAL) 10 MG tablet Take 1 tablet (10 mg total) by mouth 3 (three) times daily as needed for muscle spasms. 40 each 1   budesonide-formoterol (SYMBICORT) 160-4.5 MCG/ACT inhaler Inhale 2 puffs into the lungs 2 (two) times daily. Am and lunch     cholecalciferol (VITAMIN D3) 25 MCG (1000 UT) tablet Take 1,000 Units by mouth every 3 (three) days.     clobetasol ointment (TEMOVATE) 0.05 % Apply vaginally twice a night for next 8 weeks 60 g 5   cyanocobalamin (,VITAMIN B-12,) 1000 MCG/ML injection Inject into the muscle every 30 (thirty) days.      cyclobenzaprine (FLEXERIL) 10 MG tablet Take 10 mg by mouth daily as needed for muscle spasms.      diclofenac (VOLTAREN) 75 MG EC tablet Take 1 tablet (75 mg total) by mouth 2 (two) times daily as needed. 60 tablet 2   ferrous sulfate 325 (65 FE) MG tablet Take 325 mg by mouth 2 (two) times daily with a meal.   3   gabapentin (NEURONTIN) 300 MG capsule TAKE 1 CAPSULE BY MOUTH THREE TIMES A DAY     hydrochlorothiazide (HYDRODIURIL) 25 MG tablet Take 25 mg by mouth daily. am  11   hydrocortisone 2.5 % cream Apply topically 2 (two) times daily as needed (Rash). Twice daily for up to 1 week to eyelids as needed for rash. 30 g 0   lidocaine (XYLOCAINE) 5 % ointment Apply 1 application topically 2 (two) times daily as needed. 150 g 5   Melatonin 3 MG CAPS Take by mouth at bedtime.     mometasone (ELOCON) 0.1 % cream APPLY 1 APPLICATION TOPICALLY DAILY AS NEEDED (RASH). 45 g 0   polyethylene glycol (MIRALAX / GLYCOLAX) packet Take 17 g by mouth at bedtime.     sucralfate (CARAFATE) 1 g tablet Take 1 g by mouth 2 (two) times daily.      valACYclovir (VALTREX) 500 MG tablet TAKE 1 TABLET BY MOUTH EVERY DAY 90 tablet 0   DULoxetine (CYMBALTA) 30 MG capsule Take 1 capsule (30 mg total) by mouth daily. 30 capsule 2   ibuprofen (ADVIL) 600 MG tablet TAKE 1  TABLET BY MOUTH EVERY 6 HOURS AS NEEDED 60 tablet 3   lovastatin (MEVACOR) 40 MG tablet Take 40 mg by mouth at bedtime. (Patient not taking: Reported on 02/11/2021)  3   pantoprazole (PROTONIX) 40 MG tablet Take 40 mg by mouth daily. am  3   No current facility-administered medications for this visit.    Review of Systems  Constitutional: Negative for chills, fatigue, fever and unexpected weight change.  HENT: Negative for trouble swallowing.  Eyes: Negative for loss of vision.  Respiratory: Negative for cough, shortness of breath and wheezing.  Cardiovascular: Negative for chest pain, leg swelling, palpitations and syncope.  GI: Negative for abdominal pain, blood in stool, diarrhea, nausea and vomiting.  GU: Negative for difficulty urinating, dysuria, frequency and hematuria.  Musculoskeletal: Negative for back pain, leg pain and joint pain.  Skin: Negative for rash.  Neurological: Negative for dizziness, headaches, light-headedness, numbness and seizures.  Psychiatric: Negative for behavioral problem, confusion, depressed mood and sleep disturbance.       Objective:  Objective   Vitals:   08/29/21 0924  BP: 126/72  Weight: 250 lb (113.4 kg)  Height: 5\' 7"  (1.702 m)   Body mass index is 39.16 kg/m.  Physical Exam Vitals and nursing note reviewed. Exam conducted with a chaperone present.  Constitutional:      Appearance: Normal appearance. She is well-developed.  HENT:     Head: Normocephalic and atraumatic.  Eyes:     Extraocular Movements: Extraocular movements intact.     Pupils: Pupils are equal, round, and reactive to light.  Cardiovascular:     Rate and Rhythm: Normal rate and regular rhythm.  Pulmonary:     Effort: Pulmonary effort is normal. No respiratory distress.     Breath sounds: Normal breath sounds.  Abdominal:     General: Abdomen is flat.     Palpations: Abdomen is soft.  Genitourinary:    Comments: External: Normal appearing vulva, continued white  skin around the hymenal ring. Musculoskeletal:        General: No signs of injury.  Skin:    General: Skin is warm and dry.  Neurological:     Mental Status: She is alert and oriented to person, place, and time.  Psychiatric:        Behavior: Behavior normal.        Thought Content: Thought content normal.        Judgment: Judgment normal.    Assessment/Plan:    65 year old with lichen sclerosus  Has been applying every other day clobetasol.  Lichen sclerosus remains active at the hymenal ring.  Recommended increasing to daily application of clobetasol and will follow-up in 4 weeks for continued monitoring.  She is also using topical lidocaine as needed.  Notes irritation under her breast and at her lower abdomen fold.  Presentation seems consistent with topical yeast.  Prescription for nystatin powder sent.  Discussed left breast ultrasound follow-up.  Ultrasound order placed.    Adrian Prows MD [facility] 08/29/2021 10:13 AM

## 2021-09-26 ENCOUNTER — Other Ambulatory Visit: Payer: Self-pay | Admitting: Obstetrics and Gynecology

## 2021-09-26 DIAGNOSIS — A6004 Herpesviral vulvovaginitis: Secondary | ICD-10-CM

## 2021-09-30 ENCOUNTER — Other Ambulatory Visit: Payer: Self-pay | Admitting: Physician Assistant

## 2021-09-30 DIAGNOSIS — M5416 Radiculopathy, lumbar region: Secondary | ICD-10-CM

## 2021-10-02 ENCOUNTER — Other Ambulatory Visit: Payer: Self-pay

## 2021-10-02 ENCOUNTER — Ambulatory Visit
Admission: RE | Admit: 2021-10-02 | Discharge: 2021-10-02 | Disposition: A | Discharge status: Home / Self Care | Payer: BC Managed Care – PPO | Source: Ambulatory Visit | Attending: Internal Medicine | Admitting: Internal Medicine

## 2021-10-02 DIAGNOSIS — N6320 Unspecified lump in the left breast, unspecified quadrant: Secondary | ICD-10-CM | POA: Insufficient documentation

## 2021-10-03 ENCOUNTER — Other Ambulatory Visit: Payer: Self-pay | Admitting: Obstetrics and Gynecology

## 2021-10-03 DIAGNOSIS — N6320 Unspecified lump in the left breast, unspecified quadrant: Secondary | ICD-10-CM

## 2021-10-03 DIAGNOSIS — Z1231 Encounter for screening mammogram for malignant neoplasm of breast: Secondary | ICD-10-CM

## 2021-10-04 ENCOUNTER — Ambulatory Visit
Admission: RE | Admit: 2021-10-04 | Discharge: 2021-10-04 | Disposition: A | Discharge status: Home / Self Care | Payer: BC Managed Care – PPO | Source: Ambulatory Visit | Attending: Physician Assistant | Admitting: Physician Assistant

## 2021-10-04 DIAGNOSIS — M5416 Radiculopathy, lumbar region: Secondary | ICD-10-CM

## 2021-10-07 ENCOUNTER — Other Ambulatory Visit: Payer: Self-pay

## 2021-10-07 ENCOUNTER — Encounter: Payer: Self-pay | Admitting: Obstetrics and Gynecology

## 2021-10-07 ENCOUNTER — Ambulatory Visit: Payer: BC Managed Care – PPO | Admitting: Obstetrics and Gynecology

## 2021-10-07 ENCOUNTER — Other Ambulatory Visit: Payer: Self-pay | Admitting: Obstetrics and Gynecology

## 2021-10-07 VITALS — BP 120/70 | Ht 67.0 in | Wt 254.0 lb

## 2021-10-07 DIAGNOSIS — N904 Leukoplakia of vulva: Secondary | ICD-10-CM | POA: Diagnosis not present

## 2021-10-07 DIAGNOSIS — B372 Candidiasis of skin and nail: Secondary | ICD-10-CM

## 2021-10-07 MED ORDER — NYSTATIN-TRIAMCINOLONE 100000-0.1 UNIT/GM-% EX OINT: 1.0000 "application " | TOPICAL_OINTMENT | Freq: Two times a day (BID) | CUTANEOUS | 3 refills | Status: DC

## 2021-10-07 NOTE — Progress Notes (Signed)
Nystatin ointment prescription sent

## 2021-10-07 NOTE — Progress Notes (Signed)
Patient ID: Mackenzie [name], female   DOB: 09/15/1955, 66 y.o.   MRN: [id number]  Reason for Consult: Gynecologic Exam and Follow-up   Referred by Idelle Crouch, MD  Subjective:     HPI:  Mackenzie [name] is a 66 y.o. female she presents today for ongoing follow-up regarding lichen sclerosus.  She reports that she recently had a herpes outbreak after there is a gap in her taking her Valtrex.  She has been able to restart this medication.  She has been compliant with applying clobetasol ointment daily.  Gynecological History  No LMP recorded. Patient is postmenopausal.    Past Medical History:  Diagnosis Date   ADD (attention deficit disorder)    Anemia    iron deficiency, b12, taking iron   Anxiety    Barrett's esophagus 2019   Cold sore    Colon polyps    COPD (chronic obstructive pulmonary disease) (HCC)    Diverticulosis    Dyspnea    on exertion   Environmental allergies    Essential hypertension    Genital herpes 05/2019   neg culture, positive type 2 IgG.   GERD (gastroesophageal reflux disease)    History of hiatal hernia    History of nerve impingement    Sees Dr. Holley Raring, Pain Management   Hyperlipidemia    Lichen sclerosus    Obesity    Osteoarthritis    hands, feet, knees   Other abnormal auditory perceptions, bilateral 2019   hip and back issues   RLS (restless legs syndrome)    Shingles 09/2018   Sleep apnea    CPAP   Vitamin B12 deficiency    Weakness of back    right side back and leg nerves burned / now burning left side   Wears contact lenses    Family History  Problem Relation Age of Onset   Breast cancer Cousin 63       mat second cousin   Liver cancer Maternal Aunt    Alcoholism Mother    Hypertension Mother    Diabetes Mother        type 1   Congestive Heart Failure Mother    Obesity Mother    Diabetes Maternal Grandmother        type 2   Breast cancer Paternal Grandmother 59   Arthritis Sister    Diabetes Sister    Stroke  Brother 51   Alcoholism Maternal Grandfather    Past Surgical History:  Procedure Laterality Date   Barbie Banner OSTEOTOMY Left 08/11/2019   Procedure: PHALANX OSTEOTOMY AKIN;  Surgeon: Albertine Patricia, DPM;  Location: [place];  Service: Podiatry;  Laterality: Left;   BONE EXCISION Left 08/11/2019   Procedure: SADDLEBONE;  Surgeon: Albertine Patricia, DPM;  Location: [place];  Service: Podiatry;  Laterality: Left;  sleep apnea   BREAST CYST ASPIRATION Right 1998   neg   broken leg repair Right    BUNIONECTOMY Right 2010   pins in toes of right foot   COLONOSCOPY  2014   COLONOSCOPY WITH PROPOFOL N/A 05/24/2018   Procedure: COLONOSCOPY WITH PROPOFOL;  Surgeon: Lollie Sails, MD;  Location: [facility] ENDOSCOPY;  Service: Endoscopy;  Laterality: N/A;   ESOPHAGOGASTRODUODENOSCOPY (EGD) WITH PROPOFOL N/A 05/24/2018   Procedure: ESOPHAGOGASTRODUODENOSCOPY (EGD) WITH PROPOFOL;  Surgeon: Lollie Sails, MD;  Location: [facility] ENDOSCOPY;  Service: Endoscopy;  Laterality: N/A;   eye plugs Bilateral    plugs placed for dry eyes   HALLUX VALGUS  AUSTIN Left 08/11/2019   Procedure: AUSTIN (MITCHELL);  Surgeon: Albertine Patricia, DPM;  Location: [place];  Service: Podiatry;  Laterality: Left;   INDUCED ABORTION     JOINT REPLACEMENT Right    hip   ORIF TOE FRACTURE Left 09/22/2019   Procedure: REMOVAL OF SCREWS WITH OPEN REDUCTION INTERNAL FIXATION (ORIF) METATARSAL (GREAT TOE);  Surgeon: Albertine Patricia, DPM;  Location: [place];  Service: Podiatry;  Laterality: Left;  pt's splint was removed and cast was applied in PACU by MD   PLANTAR FASCIA SURGERY Left 2005   TOTAL HIP ARTHROPLASTY Right 08/11/2018   Procedure: TOTAL HIP ARTHROPLASTY;  Surgeon: Dereck Leep, MD;  Location: [facility];  Service: Orthopedics;  Laterality: Right;    Short Social History:  Social History   Tobacco Use   Smoking status: Former    Packs/day: 0.50    Years: 10.00    Pack  years: 5.00    Types: Cigarettes    Quit date: 07/22/2016    Years since quitting: 5.2   Smokeless tobacco: Never  Substance Use Topics   Alcohol use: Yes    Alcohol/week: 1.0 - 4.0 standard drink    Types: 1 - 2 Shots of liquor per week    Comment: occasional    No Known Allergies  Current Outpatient Medications  Medication Sig Dispense Refill   ALPRAZolam (XANAX) 1 MG tablet Take by mouth.     baclofen (LIORESAL) 10 MG tablet Take 1 tablet (10 mg total) by mouth 3 (three) times daily as needed for muscle spasms. 40 each 1   budesonide-formoterol (SYMBICORT) 160-4.5 MCG/ACT inhaler Inhale 2 puffs into the lungs 2 (two) times daily. Am and lunch     cholecalciferol (VITAMIN D3) 25 MCG (1000 UT) tablet Take 1,000 Units by mouth every 3 (three) days.     clobetasol ointment (TEMOVATE) 0.05 % Apply vaginally twice a night for next 8 weeks 60 g 5   cyanocobalamin (,VITAMIN B-12,) 1000 MCG/ML injection Inject into the muscle every 30 (thirty) days.      cyclobenzaprine (FLEXERIL) 10 MG tablet Take 10 mg by mouth daily as needed for muscle spasms.      diclofenac (VOLTAREN) 75 MG EC tablet Take 1 tablet (75 mg total) by mouth 2 (two) times daily as needed. 60 tablet 2   ferrous sulfate 325 (65 FE) MG tablet Take 325 mg by mouth 2 (two) times daily with a meal.   3   gabapentin (NEURONTIN) 300 MG capsule TAKE 1 CAPSULE BY MOUTH THREE TIMES A DAY     hydrochlorothiazide (HYDRODIURIL) 25 MG tablet Take 25 mg by mouth daily. am  11   hydrocortisone 2.5 % cream Apply topically 2 (two) times daily as needed (Rash). Twice daily for up to 1 week to eyelids as needed for rash. 30 g 0   lidocaine (XYLOCAINE) 5 % ointment Apply 1 application topically 2 (two) times daily as needed. 150 g 5   Melatonin 3 MG CAPS Take by mouth at bedtime.     mometasone (ELOCON) 0.1 % cream APPLY 1 APPLICATION TOPICALLY DAILY AS NEEDED (RASH). 45 g 0   nystatin (MYCOSTATIN/NYSTOP) powder Apply 1 application topically 3  (three) times daily. 15 g 0   polyethylene glycol (MIRALAX / GLYCOLAX) packet Take 17 g by mouth at bedtime.     sucralfate (CARAFATE) 1 g tablet Take 1 g by mouth 2 (two) times daily.      topiramate (TOPAMAX) 50 MG tablet  Take 1 tablet by mouth 2 (two) times daily.     valACYclovir (VALTREX) 500 MG tablet TAKE 1 TABLET BY MOUTH EVERY DAY 90 tablet 0   DULoxetine (CYMBALTA) 30 MG capsule Take 1 capsule (30 mg total) by mouth daily. 30 capsule 2   No current facility-administered medications for this visit.    Review of Systems  Constitutional: Negative for chills, fatigue, fever and unexpected weight change.  HENT: Negative for trouble swallowing.  Eyes: Negative for loss of vision.  Respiratory: Negative for cough, shortness of breath and wheezing.  Cardiovascular: Negative for chest pain, leg swelling, palpitations and syncope.  GI: Negative for abdominal pain, blood in stool, diarrhea, nausea and vomiting.  GU: Negative for difficulty urinating, dysuria, frequency and hematuria.  Musculoskeletal: Negative for back pain, leg pain and joint pain.  Skin: Negative for rash.  Neurological: Negative for dizziness, headaches, light-headedness, numbness and seizures.  Psychiatric: Negative for behavioral problem, confusion, depressed mood and sleep disturbance.       Objective:  Objective   Vitals:   10/07/21 1055  BP: 120/70  Weight: 254 lb (115.2 kg)  Height: 5\' 7"  (1.702 m)   Body mass index is 39.78 kg/m.  Physical Exam Vitals and nursing note reviewed. Exam conducted with a chaperone present.  Constitutional:      Appearance: Normal appearance. She is well-developed.  HENT:     Head: Normocephalic and atraumatic.  Eyes:     Extraocular Movements: Extraocular movements intact.     Pupils: Pupils are equal, round, and reactive to light.  Cardiovascular:     Rate and Rhythm: Normal rate and regular rhythm.  Pulmonary:     Effort: Pulmonary effort is normal. No  respiratory distress.     Breath sounds: Normal breath sounds.  Abdominal:     General: Abdomen is flat.     Palpations: Abdomen is soft.  Genitourinary:      Comments: Whitening on bilateral labia.  Some improvement from prior visit.  Small herpes erosion on left labia and at clitoris. Red lines no areas of whitening. Musculoskeletal:        General: No signs of injury.  Skin:    General: Skin is warm and dry.  Neurological:     Mental Status: She is alert and oriented to person, place, and time.  Psychiatric:        Behavior: Behavior normal.        Thought Content: Thought content normal.        Judgment: Judgment normal.    Assessment/Plan:     66 year old following up for ongoing management of lichen sclerosis.  Recommended continuing daily application of clobetasol ointment following up in 2 months.  More than 10 minutes were spent face to face with the patient in the room, reviewing the medical record, labs and images, and coordinating care for the patient. The plan of management was discussed in detail and counseling was provided.    Adrian Prows MD [facility] 10/07/2021 11:41 AM

## 2021-11-11 ENCOUNTER — Other Ambulatory Visit: Payer: Self-pay | Admitting: Orthopaedic Surgery

## 2021-11-11 ENCOUNTER — Other Ambulatory Visit: Payer: Self-pay | Admitting: Obstetrics and Gynecology

## 2021-11-11 DIAGNOSIS — L900 Lichen sclerosus et atrophicus: Secondary | ICD-10-CM

## 2021-11-14 ENCOUNTER — Ambulatory Visit: Payer: BC Managed Care – PPO | Admitting: Dermatology

## 2021-11-14 ENCOUNTER — Other Ambulatory Visit: Payer: Self-pay

## 2021-11-14 DIAGNOSIS — Z1283 Encounter for screening for malignant neoplasm of skin: Secondary | ICD-10-CM

## 2021-11-14 DIAGNOSIS — D229 Melanocytic nevi, unspecified: Secondary | ICD-10-CM

## 2021-11-14 DIAGNOSIS — L2089 Other atopic dermatitis: Secondary | ICD-10-CM | POA: Diagnosis not present

## 2021-11-14 DIAGNOSIS — D1800 Hemangioma unspecified site: Secondary | ICD-10-CM

## 2021-11-14 DIAGNOSIS — L821 Other seborrheic keratosis: Secondary | ICD-10-CM

## 2021-11-14 DIAGNOSIS — I781 Nevus, non-neoplastic: Secondary | ICD-10-CM | POA: Diagnosis not present

## 2021-11-14 DIAGNOSIS — R202 Paresthesia of skin: Secondary | ICD-10-CM

## 2021-11-14 DIAGNOSIS — L578 Other skin changes due to chronic exposure to nonionizing radiation: Secondary | ICD-10-CM

## 2021-11-14 DIAGNOSIS — L814 Other melanin hyperpigmentation: Secondary | ICD-10-CM

## 2021-11-14 DIAGNOSIS — I872 Venous insufficiency (chronic) (peripheral): Secondary | ICD-10-CM

## 2021-11-14 DIAGNOSIS — Z86018 Personal history of other benign neoplasm: Secondary | ICD-10-CM

## 2021-11-14 MED ORDER — MOMETASONE FUROATE 0.1 % EX CREA: 1.0000 "application " | TOPICAL_CREAM | Freq: Every day | CUTANEOUS | 11 refills | Status: DC | PRN

## 2021-11-14 MED ORDER — MOMETASONE FUROATE 0.1 % EX CREA: 1.0000 "application " | TOPICAL_CREAM | Freq: Every day | CUTANEOUS | 0 refills | Status: DC | PRN

## 2021-11-14 NOTE — Patient Instructions (Signed)

## 2021-11-14 NOTE — Progress Notes (Unsigned)
Follow-Up Visit   Subjective  Mackenzie [name] is a 66 y.o. female who presents for the following: Annual Exam (Mole check ). Yearly mole check hx of Dysplastic nevus. The patient presents for Total-Body Skin Exam (TBSE) for skin cancer screening and mole check.  The patient has spots, moles and lesions to be evaluated, some may be new or changing and the patient has concerns that these could be cancer.    The following portions of the chart were reviewed this encounter and updated as appropriate:       Review of Systems:  No other skin or systemic complaints except as noted in HPI or Assessment and Plan.  Objective  Well appearing patient in no apparent distress; mood and affect are within normal limits.  A full examination was performed including scalp, head, eyes, ears, nose, lips, neck, chest, axillae, abdomen, back, buttocks, bilateral upper extremities, bilateral lower extremities, hands, feet, fingers, toes, fingernails, and toenails. All findings within normal limits unless otherwise noted below.  right cheek Dilated blood vessel   lower legs Scaly erythematous papules and patches +/- dyspigmentation, lichenification, excoriations.   lower back No visible rash     Assessment & Plan  Telangiectasia right cheek  Discussed the treatment option of BBL/laser.  Typically we recommend 1-3 treatment sessions about 5-8 weeks apart for best results.  The patient's condition may require "maintenance treatments" in the future.  The fee for BBL / laser treatments is $200 per treatment session for the whole face.  A fee can be quoted for other parts of the body. Insurance typically does not pay for BBL/laser treatments and therefore the fee is an out-of-pocket cost.   Other atopic dermatitis lower legs  Atopic dermatitis (eczema) is a chronic, relapsing, pruritic condition that can significantly affect quality of life. It is often associated with allergic rhinitis and/or asthma and  can require treatment with topical medications, phototherapy, or in severe cases biologic injectable medication (Dupixent; Adbry) or Oral JAK inhibitors.   Start Mometasone cream apply to affected skin qd prn   Related Medications mometasone (ELOCON) 0.1 % cream Apply 1 application. topically daily as needed (Rash).  Notalgia paresthetica lower back  chronic   Start Mometasone cream apply to affected skin at bedtime   Related Medications mometasone (ELOCON) 0.1 % cream Apply 1 application. topically daily as needed (Rash).  Venous stasis dermatitis of right lower extremity   Lentigines - Scattered tan macules - Due to sun exposure - Benign-appearing, observe - Recommend daily broad spectrum sunscreen SPF 30+ to sun-exposed areas, reapply every 2 hours as needed. - Call for any changes  Seborrheic Keratoses - Stuck-on, waxy, tan-brown papules and/or plaques  - Benign-appearing - Discussed benign etiology and prognosis. - Observe - Call for any changes  Melanocytic Nevi - Tan-brown and/or pink-flesh-colored symmetric macules and papules - Benign appearing on exam today - Observation - Call clinic for new or changing moles - Recommend daily use of broad spectrum spf 30+ sunscreen to sun-exposed areas.   Hemangiomas - Red papules - Discussed benign nature - Observe - Call for any changes  Actinic Damage - Chronic condition, secondary to cumulative UV/sun exposure - diffuse scaly erythematous macules with underlying dyspigmentation - Recommend daily broad spectrum sunscreen SPF 30+ to sun-exposed areas, reapply every 2 hours as needed.  - Staying in the shade or wearing long sleeves, sun glasses (UVA+UVB protection) and wide brim hats (4-inch brim around the entire circumference of the hat) are also recommended  for sun protection.  - Call for new or changing lesions.  History of Dysplastic Nevi Right upper back 2015 - No evidence of recurrence today - Recommend  regular full body skin exams - Recommend daily broad spectrum sunscreen SPF 30+ to sun-exposed areas, reapply every 2 hours as needed.  - Call if any new or changing lesions are noted between office visits   Skin cancer screening performed today.   Return in about 1 year (around 11/15/2022) for TBSE, Hx of Dysplastic nevus .  IMarye Round, CMA, am acting as scribe for Sarina Ser, MD .

## 2021-11-19 ENCOUNTER — Encounter: Payer: Self-pay | Admitting: Dermatology

## 2021-11-26 ENCOUNTER — Encounter: Payer: Self-pay | Admitting: Orthopaedic Surgery

## 2021-12-02 ENCOUNTER — Other Ambulatory Visit: Payer: Self-pay | Admitting: Obstetrics and Gynecology

## 2021-12-02 DIAGNOSIS — N9089 Other specified noninflammatory disorders of vulva and perineum: Secondary | ICD-10-CM

## 2021-12-09 ENCOUNTER — Encounter: Payer: Self-pay | Admitting: Obstetrics and Gynecology

## 2021-12-09 ENCOUNTER — Ambulatory Visit: Payer: BC Managed Care – PPO | Admitting: Obstetrics and Gynecology

## 2021-12-09 VITALS — BP 126/84

## 2021-12-09 DIAGNOSIS — L900 Lichen sclerosus et atrophicus: Secondary | ICD-10-CM | POA: Diagnosis not present

## 2021-12-09 NOTE — Progress Notes (Signed)
? ?Patient ID: Mackenzie [name], female   DOB: [birth year], 66 y.o.   MRN: [id number] ? ?Reason for Consult: Follow-up ?  ?Referred by Idelle Crouch, MD ? ?Subjective:  ?   ?HPI: ? ?Mackenzie [name] is a 66 y.o. female she presents today for follow-up of lichen sclerosus.  She reports that she has been using clobetasol ointment daily.  She also uses lidocaine as needed.  She reports that the lidocaine helped significantly with her symptoms of itching and scratched.  She has had difficult to manage lichen sclerosus.  She previously has had intralesional injections of steroids.  She has not tolerated tacrolimus therapy in the past. ? ?Gynecological History ? ?No LMP recorded. Patient is postmenopausal. ? ?Past Medical History:  ?Diagnosis Date  ? ADD (attention deficit disorder)   ? Anemia   ? iron deficiency, b12, taking iron  ? Anxiety   ? Barrett's esophagus 2019  ? Cold sore   ? Colon polyps   ? COPD (chronic obstructive pulmonary disease) ([place])   ? Diverticulosis   ? Dysplastic nevus 06/22/2014  ? R upper back paraspinal - moderate  ? Dyspnea   ? on exertion  ? Environmental allergies   ? Essential hypertension   ? Genital herpes 05/2019  ? neg culture, positive type 2 IgG.  ? GERD (gastroesophageal reflux disease)   ? History of hiatal hernia   ? History of nerve impingement   ? Sees Dr. Holley Raring, Pain Management  ? Hyperlipidemia   ? Lichen sclerosus   ? Obesity   ? Osteoarthritis   ? hands, feet, knees  ? Other abnormal auditory perceptions, bilateral 2019  ? hip and back issues  ? RLS (restless legs syndrome)   ? Shingles 09/2018  ? Sleep apnea   ? CPAP  ? Vitamin B12 deficiency   ? Weakness of back   ? right side back and leg nerves burned / now burning left side  ? Wears contact lenses   ? ?Family History  ?Problem Relation Age of Onset  ? Breast cancer Cousin 87  ?     mat second cousin  ? Liver cancer Maternal Aunt   ? Alcoholism Mother   ? Hypertension Mother   ? Diabetes Mother   ?     type 1  ? Congestive  Heart Failure Mother   ? Obesity Mother   ? Diabetes Maternal Grandmother   ?     type 2  ? Breast cancer Paternal Grandmother 52  ? Arthritis Sister   ? Diabetes Sister   ? Stroke Brother 19  ? Alcoholism Maternal Grandfather   ? ?Past Surgical History:  ?Procedure Laterality Date  ? AIKEN OSTEOTOMY Left 08/11/2019  ? Procedure: PHALANX OSTEOTOMY AKIN;  Surgeon: Albertine Patricia, DPM;  Location: [place];  Service: Podiatry;  Laterality: Left;  ? BONE EXCISION Left 08/11/2019  ? Procedure: SADDLEBONE;  Surgeon: Albertine Patricia, DPM;  Location: [place];  Service: Podiatry;  Laterality: Left;  sleep apnea  ? BREAST CYST ASPIRATION Right 1998  ? neg  ? broken leg repair Right   ? BUNIONECTOMY Right 2010  ? pins in toes of right foot  ? COLONOSCOPY  2014  ? COLONOSCOPY WITH PROPOFOL N/A 05/24/2018  ? Procedure: COLONOSCOPY WITH PROPOFOL;  Surgeon: Lollie Sails, MD;  Location[facility] ENDOSCOPY;  Service: Endoscopy;  Laterality: N/A;  ? ESOPHAGOGASTRODUODENOSCOPY (EGD) WITH PROPOFOL N/A 05/24/2018  ? Procedure: ESOPHAGOGASTRODUODENOSCOPY (EGD) WITH PROPOFOL;  Surgeon: Loistine Simas  U, MD;  Location: [facility] ENDOSCOPY;  Service: Endoscopy;  Laterality: N/A;  ? eye plugs Bilateral   ? plugs placed for dry eyes  ? HALLUX VALGUS AUSTIN Left 08/11/2019  ? Procedure: AUSTIN (MITCHELL);  Surgeon: Albertine Patricia, DPM;  Location: [place];  Service: Podiatry;  Laterality: Left;  ? INDUCED ABORTION    ? JOINT REPLACEMENT Right   ? hip  ? ORIF TOE FRACTURE Left 09/22/2019  ? Procedure: REMOVAL OF SCREWS WITH OPEN REDUCTION INTERNAL FIXATION (ORIF) METATARSAL (GREAT TOE);  Surgeon: Albertine Patricia, DPM;  Location: [place];  Service: Podiatry;  Laterality: Left;  pt's splint was removed and cast was applied in PACU by MD  ? PLANTAR FASCIA SURGERY Left 2005  ? TOTAL HIP ARTHROPLASTY Right 08/11/2018  ? Procedure: TOTAL HIP ARTHROPLASTY;  Surgeon: Dereck Leep, MD;  Location: [facility] ORS;   Service: Orthopedics;  Laterality: Right;  ? ? ?Short Social History:  ?Social History  ? ?Tobacco Use  ? Smoking status: Former  ?  Packs/day: 0.50  ?  Years: 10.00  ?  Pack years: 5.00  ?  Types: Cigarettes  ?  Quit date: 07/22/2016  ?  Years since quitting: 5.3  ? Smokeless tobacco: Never  ?Substance Use Topics  ? Alcohol use: Yes  ?  Alcohol/week: 1.0 - 4.0 standard drink  ?  Types: 1 - 2 Shots of liquor per week  ?  Comment: occasional  ? ? ?No Known Allergies ? ?Current Outpatient Medications  ?Medication Sig Dispense Refill  ? ALPRAZolam (XANAX) 1 MG tablet Take by mouth.    ? baclofen (LIORESAL) 10 MG tablet TAKE 1 TABLET BY MOUTH THREE TIMES A DAY AS NEEDED FOR MUSCLE SPASMS 40 tablet 1  ? budesonide-formoterol (SYMBICORT) 160-4.5 MCG/ACT inhaler Inhale 2 puffs into the lungs 2 (two) times daily. Am and lunch    ? cholecalciferol (VITAMIN D3) 25 MCG (1000 UT) tablet Take 1,000 Units by mouth every 3 (three) days.    ? clobetasol ointment (TEMOVATE) 0.05 % APPLY VAGINALLY TWICE A NIGHT FOR NEXT 8 WEEKS 60 g 5  ? cyanocobalamin (,VITAMIN B-12,) 1000 MCG/ML injection Inject into the muscle every 30 (thirty) days.     ? cyclobenzaprine (FLEXERIL) 10 MG tablet Take 10 mg by mouth daily as needed for muscle spasms.     ? diclofenac (VOLTAREN) 75 MG EC tablet Take 1 tablet (75 mg total) by mouth 2 (two) times daily as needed. 60 tablet 2  ? ferrous sulfate 325 (65 FE) MG tablet Take 325 mg by mouth 2 (two) times daily with a meal.   3  ? gabapentin (NEURONTIN) 300 MG capsule TAKE 1 CAPSULE BY MOUTH THREE TIMES A DAY    ? hydrochlorothiazide (HYDRODIURIL) 25 MG tablet Take 25 mg by mouth daily. am  11  ? hydrocortisone 2.5 % cream Apply topically 2 (two) times daily as needed (Rash). Twice daily for up to 1 week to eyelids as needed for rash. 30 g 0  ? lidocaine (XYLOCAINE) 5 % ointment APPLY 1 APPLICATION TOPICALLY 2 (TWO) TIMES DAILY AS NEEDED. 50 g 17  ? Melatonin 3 MG CAPS Take by mouth at bedtime.    ?  mometasone (ELOCON) 0.1 % cream Apply 1 application. topically daily as needed (Rash). 45 g 11  ? nystatin-triamcinolone ointment (MYCOLOG) Apply 1 application topically 2 (two) times daily. 30 g 3  ? polyethylene glycol (MIRALAX / GLYCOLAX) packet Take 17 g by mouth at bedtime.    ?  topiramate (TOPAMAX) 50 MG tablet Take 1 tablet by mouth 2 (two) times daily.    ? valACYclovir (VALTREX) 500 MG tablet TAKE 1 TABLET BY MOUTH EVERY DAY 90 tablet 0  ? DULoxetine (CYMBALTA) 60 MG capsule Take 60 mg by mouth daily.    ? ?No current facility-administered medications for this visit.  ? ? ?Review of Systems  ?Constitutional: Negative for chills, fatigue, fever and unexpected weight change.  ?HENT: Negative for trouble swallowing.  ?Eyes: Negative for loss of vision.  ?Respiratory: Negative for cough, shortness of breath and wheezing.  ?Cardiovascular: Negative for chest pain, leg swelling, palpitations and syncope.  ?GI: Negative for abdominal pain, blood in stool, diarrhea, nausea and vomiting.  ?GU: Negative for difficulty urinating, dysuria, frequency and hematuria.  ?Musculoskeletal: Negative for back pain, leg pain and joint pain.  ?Skin: Negative for rash.  ?Neurological: Negative for dizziness, headaches, light-headedness, numbness and seizures.  ?Psychiatric: Negative for behavioral problem, confusion, depressed mood and sleep disturbance.   ? ?   ?Objective:  ?Objective  ? ?Vitals:  ? 12/09/21 1439  ?BP: 126/84  ? ?There is no height or weight on file to calculate BMI. ? ?Physical Exam ?Vitals and nursing note reviewed. Exam conducted with a chaperone present.  ?Constitutional:   ?   Appearance: Normal appearance. She is well-developed.  ?HENT:  ?   Head: Normocephalic and atraumatic.  ?Eyes:  ?   Extraocular Movements: Extraocular movements intact.  ?   Pupils: Pupils are equal, round, and reactive to light.  ?Cardiovascular:  ?   Rate and Rhythm: Normal rate and regular rhythm.  ?Pulmonary:  ?   Effort: Pulmonary  effort is normal. No respiratory distress.  ?   Breath sounds: Normal breath sounds.  ?Abdominal:  ?   General: Abdomen is flat.  ?   Palpations: Abdomen is soft.  ?Genitourinary: ?   Comments: External: Atro

## 2022-03-26 ENCOUNTER — Encounter: Payer: Self-pay | Admitting: Obstetrics and Gynecology

## 2022-03-26 ENCOUNTER — Other Ambulatory Visit: Payer: BC Managed Care – PPO

## 2022-03-26 ENCOUNTER — Ambulatory Visit
Admission: RE | Admit: 2022-03-26 | Discharge: 2022-03-26 | Disposition: A | Discharge status: Home / Self Care | Payer: BC Managed Care – PPO | Source: Ambulatory Visit | Attending: Obstetrics and Gynecology | Admitting: Obstetrics and Gynecology

## 2022-03-26 DIAGNOSIS — N6320 Unspecified lump in the left breast, unspecified quadrant: Secondary | ICD-10-CM | POA: Insufficient documentation

## 2022-03-26 DIAGNOSIS — Z1231 Encounter for screening mammogram for malignant neoplasm of breast: Secondary | ICD-10-CM

## 2022-05-22 ENCOUNTER — Encounter: Payer: Self-pay | Admitting: Podiatry

## 2022-05-22 ENCOUNTER — Other Ambulatory Visit: Payer: Self-pay | Admitting: Podiatry

## 2022-05-27 NOTE — Discharge Instructions (Signed)
[facility]  POST OPERATIVE INSTRUCTIONS FOR DR. FOWLER AND [facility] PODIATRY DEPARTMENT   Take your medication as prescribed.  Pain medication should be taken only as needed.  Keep the dressing clean, dry and intact.  Keep your foot elevated above the heart level for the first 48 hours.  Walking to the bathroom and brief periods of walking are acceptable, unless we have instructed you to be non-weight bearing.  Always wear your post-op shoe when walking.  Always use your crutches if you are to be non-weight bearing.  Do not take a shower. Baths are permissible as long as the foot is kept out of the water.   Every hour you are awake:  Bend your knee 15 times. Flex foot 15 times Massage calf 15 times  Call [facility] ([phone]) if any of the following problems occur: You develop a temperature or fever. The bandage becomes saturated with blood. Medication does not stop your pain. Injury of the foot occurs. Any symptoms of infection including redness, odor, or red streaks running from wound. 

## 2022-05-28 ENCOUNTER — Ambulatory Visit: Payer: BC Managed Care – PPO | Admitting: Anesthesiology

## 2022-05-28 ENCOUNTER — Encounter
Admission: RE | Disposition: A | Discharge status: Home / Self Care | Payer: Self-pay | Source: Home / Self Care | Attending: Podiatry

## 2022-05-28 ENCOUNTER — Encounter: Payer: Self-pay | Admitting: Podiatry

## 2022-05-28 ENCOUNTER — Ambulatory Visit
Admission: RE | Admit: 2022-05-28 | Discharge: 2022-05-28 | Disposition: A | Discharge status: Home / Self Care | Payer: BC Managed Care – PPO | Attending: Podiatry | Admitting: Podiatry

## 2022-05-28 ENCOUNTER — Ambulatory Visit: Payer: Self-pay

## 2022-05-28 ENCOUNTER — Other Ambulatory Visit: Payer: Self-pay

## 2022-05-28 DIAGNOSIS — I10 Essential (primary) hypertension: Secondary | ICD-10-CM | POA: Insufficient documentation

## 2022-05-28 DIAGNOSIS — G4730 Sleep apnea, unspecified: Secondary | ICD-10-CM | POA: Diagnosis not present

## 2022-05-28 DIAGNOSIS — K219 Gastro-esophageal reflux disease without esophagitis: Secondary | ICD-10-CM | POA: Insufficient documentation

## 2022-05-28 DIAGNOSIS — G709 Myoneural disorder, unspecified: Secondary | ICD-10-CM | POA: Diagnosis not present

## 2022-05-28 DIAGNOSIS — M1990 Unspecified osteoarthritis, unspecified site: Secondary | ICD-10-CM | POA: Insufficient documentation

## 2022-05-28 DIAGNOSIS — Z87891 Personal history of nicotine dependence: Secondary | ICD-10-CM | POA: Insufficient documentation

## 2022-05-28 DIAGNOSIS — T8484XA Pain due to internal orthopedic prosthetic devices, implants and grafts, initial encounter: Secondary | ICD-10-CM | POA: Diagnosis not present

## 2022-05-28 DIAGNOSIS — Y793 Surgical instruments, materials and orthopedic devices (including sutures) associated with adverse incidents: Secondary | ICD-10-CM | POA: Diagnosis not present

## 2022-05-28 DIAGNOSIS — J449 Chronic obstructive pulmonary disease, unspecified: Secondary | ICD-10-CM | POA: Insufficient documentation

## 2022-05-28 DIAGNOSIS — Y831 Surgical operation with implant of artificial internal device as the cause of abnormal reaction of the patient, or of later complication, without mention of misadventure at the time of the procedure: Secondary | ICD-10-CM | POA: Insufficient documentation

## 2022-05-28 HISTORY — PX: MINOR HARDWARE REMOVAL: SHX6474

## 2022-05-28 SURGERY — MINOR HARDWARE REMOVAL
Anesthesia: General | Site: Foot | Laterality: Left

## 2022-05-28 MED ORDER — FENTANYL CITRATE (PF) 100 MCG/2ML IJ SOLN
INTRAMUSCULAR | Status: DC | PRN
  Administered 2022-05-28: 25 ug via INTRAVENOUS
  Administered 2022-05-28: 50 ug via INTRAVENOUS
  Administered 2022-05-28: 25 ug via INTRAVENOUS

## 2022-05-28 MED ORDER — DEXMEDETOMIDINE HCL IN NACL 200 MCG/50ML IV SOLN
INTRAVENOUS | Status: DC | PRN
  Administered 2022-05-28: 8 ug via INTRAVENOUS

## 2022-05-28 MED ORDER — HYDROMORPHONE HCL 1 MG/ML IJ SOLN: 0.2500 mg | INTRAMUSCULAR | Status: DC | PRN

## 2022-05-28 MED ORDER — LACTATED RINGERS IV SOLN: INTRAVENOUS | Status: DC

## 2022-05-28 MED ORDER — PROPOFOL 10 MG/ML IV BOLUS
INTRAVENOUS | Status: DC | PRN
  Administered 2022-05-28: 350 mg via INTRAVENOUS

## 2022-05-28 MED ORDER — PROMETHAZINE HCL 25 MG/ML IJ SOLN: 6.2500 mg | INTRAMUSCULAR | Status: DC | PRN

## 2022-05-28 MED ORDER — OXYCODONE HCL 5 MG/5ML PO SOLN: 5.0000 mg | Freq: Once | ORAL | Status: DC | PRN

## 2022-05-28 MED ORDER — LIDOCAINE-EPINEPHRINE 1 %-1:100000 IJ SOLN
INTRAMUSCULAR | Status: DC | PRN
  Administered 2022-05-28: 5 mL

## 2022-05-28 MED ORDER — GLYCOPYRROLATE 0.2 MG/ML IJ SOLN
INTRAMUSCULAR | Status: DC | PRN
  Administered 2022-05-28: .2 mg via INTRAVENOUS

## 2022-05-28 MED ORDER — ONDANSETRON HCL 4 MG/2ML IJ SOLN
INTRAMUSCULAR | Status: DC | PRN
  Administered 2022-05-28: 4 mg via INTRAVENOUS

## 2022-05-28 MED ORDER — OXYCODONE HCL 5 MG PO TABS: 5.0000 mg | ORAL_TABLET | Freq: Once | ORAL | Status: DC | PRN

## 2022-05-28 MED ORDER — BUPIVACAINE HCL 0.25 % IJ SOLN
INTRAMUSCULAR | Status: DC | PRN
  Administered 2022-05-28: 5 mL

## 2022-05-28 MED ORDER — OXYCODONE-ACETAMINOPHEN 5-325 MG PO TABS: 1.0000 | ORAL_TABLET | Freq: Four times a day (QID) | ORAL | 0 refills | Status: AC | PRN

## 2022-05-28 MED ORDER — DEXAMETHASONE SODIUM PHOSPHATE 4 MG/ML IJ SOLN
INTRAMUSCULAR | Status: DC | PRN
  Administered 2022-05-28: 8 mg via INTRAVENOUS

## 2022-05-28 MED ORDER — MIDAZOLAM HCL 5 MG/5ML IJ SOLN
INTRAMUSCULAR | Status: DC | PRN
  Administered 2022-05-28: 2 mg via INTRAVENOUS

## 2022-05-28 MED ORDER — ONDANSETRON HCL 4 MG/2ML IJ SOLN: 4.0000 mg | Freq: Four times a day (QID) | INTRAMUSCULAR | Status: DC | PRN

## 2022-05-28 MED ORDER — METOCLOPRAMIDE HCL 5 MG/ML IJ SOLN: 5.0000 mg | Freq: Three times a day (TID) | INTRAMUSCULAR | Status: DC | PRN

## 2022-05-28 MED ORDER — CEFAZOLIN SODIUM-DEXTROSE 2-4 GM/100ML-% IV SOLN: 2.0000 g | INTRAVENOUS | Status: DC

## 2022-05-28 MED ORDER — METOCLOPRAMIDE HCL 5 MG PO TABS: 5.0000 mg | ORAL_TABLET | Freq: Three times a day (TID) | ORAL | Status: DC | PRN

## 2022-05-28 MED ORDER — ONDANSETRON HCL 4 MG PO TABS: 4.0000 mg | ORAL_TABLET | Freq: Four times a day (QID) | ORAL | Status: DC | PRN

## 2022-05-28 SURGICAL SUPPLY — 35 items
BNDG CMPR 75X41 PLY HI ABS (GAUZE/BANDAGES/DRESSINGS) ×1
BNDG ESMARK 4X12 TAN STRL LF (GAUZE/BANDAGES/DRESSINGS) ×2 IMPLANT
BNDG STRETCH 4X75 STRL LF (GAUZE/BANDAGES/DRESSINGS) ×2 IMPLANT
CUFF TOURN SGL QUICK 30 (TOURNIQUET CUFF) ×1
CUFF TOURN SGL QUICK 34 (TOURNIQUET CUFF) ×1
CUFF TRNQT CYL 30X4X21-28X (TOURNIQUET CUFF) ×2 IMPLANT
CUFF TRNQT CYL 34X4X40X1 (TOURNIQUET CUFF) ×2 IMPLANT
DRAPE FLUOR MINI C-ARM 54X84 (DRAPES) ×2 IMPLANT
DURAPREP 26ML APPLICATOR (WOUND CARE) ×2 IMPLANT
ELECT REM PT RETURN 9FT ADLT (ELECTROSURGICAL) ×1
ELECTRODE REM PT RTRN 9FT ADLT (ELECTROSURGICAL) ×2 IMPLANT
GAUZE SPONGE 4X4 12PLY STRL (GAUZE/BANDAGES/DRESSINGS) ×2 IMPLANT
GAUZE XEROFORM 1X8 LF (GAUZE/BANDAGES/DRESSINGS) ×2 IMPLANT
GLOVE INDICATOR 8.0 STRL GRN (GLOVE) ×2 IMPLANT
GLOVE SURG ENC MOIS LTX SZ7.5 (GLOVE) ×2 IMPLANT
GOWN STRL REUS W/ TWL LRG LVL3 (GOWN DISPOSABLE) ×4 IMPLANT
GOWN STRL REUS W/TWL LRG LVL3 (GOWN DISPOSABLE) ×2
K-WIRE DBL END TROCAR 6X.045 (WIRE) ×1
KWIRE DBL END TROCAR 6X.045 (WIRE) IMPLANT
NDL FILTER BLUNT 18X1 1/2 (NEEDLE) ×2 IMPLANT
NDL HYPO 25GX1 SAFETY (NEEDLE) ×6 IMPLANT
NEEDLE FILTER BLUNT 18X1 1/2 (NEEDLE) ×1 IMPLANT
NEEDLE HYPO 25GX1 SAFETY (NEEDLE) ×3 IMPLANT
NS IRRIG 500ML POUR BTL (IV SOLUTION) ×2 IMPLANT
PACK EXTREMITY ARMC (MISCELLANEOUS) ×2 IMPLANT
STOCKINETTE IMPERVIOUS LG (DRAPES) ×2 IMPLANT
STRIP CLOSURE SKIN 1/4X4 (GAUZE/BANDAGES/DRESSINGS) ×2 IMPLANT
SUT MNCRL 4-0 (SUTURE) ×1
SUT MNCRL 4-0 27XMFL (SUTURE) ×1
SUT VIC AB 4-0 FS2 27 (SUTURE) ×2 IMPLANT
SUT VIC AB 4-0 SH 27 (SUTURE) ×1
SUT VIC AB 4-0 SH 27XANBCTRL (SUTURE) IMPLANT
SUTURE MNCRL 4-0 27XMF (SUTURE) IMPLANT
SWABSTK COMLB BENZOIN TINCTURE (MISCELLANEOUS) ×2 IMPLANT
SYR 10ML LL (SYRINGE) ×2 IMPLANT

## 2022-05-28 NOTE — Op Note (Signed)
Operative note   Surgeon:Demetrus Pavao Lawyer: None    Preop diagnosis: Painful retained hardware left foot    Postop diagnosis: Same    Procedure: 1.  Removal of retained screw left foot first metatarsal 2.  Removal of retained K wire left foot first metatarsal 3.  Intraoperative fluoroscopy used left foot    EBL: Minimal    Anesthesia:local and general local consisted of a total of 5 cc of 0.25% bupivacaine plain and 5 cc of lidocaine with epinephrine    Hemostasis: Ankle tourniquet inflated to 200 mmHg for approximately 30 minutes    Specimen: None    Complications: None    Operative indications:Mackenzie [name] is an 66 y.o. that presents today for surgical intervention.  The risks/benefits/alternatives/complications have been discussed and consent has been given.    Procedure:  Patient was brought into the OR and placed on the operating table in thesupine position. After anesthesia was obtained theleft lower extremity was prepped and draped in usual sterile fashion.  Attention was directed to the distal aspect of the first metatarsal just proximal to the metatarsophalangeal joint where approximately 2 cm incision was performed.  Sharp and blunt dissected down to the periosteum and subperiosteal dissection was undertaken.  At this time with the use of intraoperative fluoroscopy the head of the screw was triangulated.  This was buried deep into the first metatarsal.  With use of a curette the dorsal cortex was 3 to 4 mm deep to the cortical margins.  At this time the screw was backed out with a appropriate star driver.  Fluoroscopy revealed good removal of the entire screw completely.  Attention was then directed more proximal along the first metatarsal where a second incision was performed.  Sharp and blunt dissection carried down to the periosteum and subperiosteal dissection was then undertaken.  With use of intraoperative fluoroscopy I was able to triangulate the K wire.  At  this time the K wire was noted and removed from the surgical field in toto.  The wound was flushed with copious amounts of irrigation.  Final fluoroscopy revealed complete removal of the screw and K wire.  Closure was performed with a 3-0 Vicryl for the deeper tissue and a 4-0 Monocryl for the skin.  A bulky sterile dressing was applied to the left foot.    Patient tolerated the procedure and anesthesia well.  Was transported from the OR to the PACU with all vital signs stable and vascular status intact. To be discharged per routine protocol.  Will follow up in approximately 1 week in the outpatient clinic.

## 2022-05-28 NOTE — Anesthesia Procedure Notes (Signed)
Procedure Name: LMA Insertion Date/Time: 05/28/2022 2:08 PM  Performed by: Tobie Poet, CRNAPre-anesthesia Checklist: Patient identified, Emergency Drugs available, Suction available and Patient being monitored Patient Re-evaluated:Patient Re-evaluated prior to induction Oxygen Delivery Method: Circle system utilized Preoxygenation: Pre-oxygenation with 100% oxygen Induction Type: IV induction Ventilation: Mask ventilation without difficulty LMA: LMA inserted LMA Size: 4.0 Tube type: Oral Number of attempts: 1 Placement Confirmation: positive ETCO2 and breath sounds checked- equal and bilateral Tube secured with: Tape Dental Injury: Teeth and Oropharynx as per pre-operative assessment

## 2022-05-28 NOTE — Transfer of Care (Signed)
Immediate Anesthesia Transfer of Care Note  Patient: Mackenzie [name]  Procedure(s) Performed: MINOR HARDWARE REMOVAL (Left: Foot)  Patient Location: PACU  Anesthesia Type: General LMA  Level of Consciousness: awake, alert  and patient cooperative  Airway and Oxygen Therapy: Patient Spontanous Breathing and Patient connected to supplemental oxygen  Post-op Assessment: Post-op Vital signs reviewed, Patient's Cardiovascular Status Stable, Respiratory Function Stable, Patent Airway and No signs of Nausea or vomiting  Post-op Vital Signs: Reviewed and stable  Complications: No notable events documented.

## 2022-05-28 NOTE — H&P (Signed)
HISTORY AND PHYSICAL INTERVAL NOTE:  05/28/2022  1:46 PM  Mackenzie [name]  has presented today for surgery, with the diagnosis of M79.672 - Left foot pain T85.848A - Pain from implanted hardware, initial encounter.  The various methods of treatment have been discussed with the patient.  No guarantees were given.  After consideration of risks, benefits and other options for treatment, the patient has consented to surgery.  I have reviewed the patients' chart and labs.     [name] history and physical examination was performed in my office.  The patient was reexamined.  There have been no changes to this history and physical examination.  Mackenzie [name]

## 2022-05-28 NOTE — Anesthesia Postprocedure Evaluation (Signed)
Anesthesia Post Note  Patient: Mackenzie [name]  Procedure(s) Performed: MINOR HARDWARE REMOVAL (Left: Foot)     Patient location during evaluation: PACU Anesthesia Type: General Level of consciousness: awake and alert Pain management: pain level controlled Vital Signs Assessment: post-procedure vital signs reviewed and stable Respiratory status: spontaneous breathing, nonlabored ventilation, respiratory function stable and patient connected to nasal cannula oxygen Cardiovascular status: blood pressure returned to baseline and stable Postop Assessment: no apparent nausea or vomiting Anesthetic complications: no   No notable events documented.  April Manson

## 2022-05-28 NOTE — Anesthesia Preprocedure Evaluation (Signed)
Anesthesia Evaluation  Patient identified by MRN, date of birth, ID band Patient awake    Reviewed: Allergy & Precautions, H&P , NPO status , Patient's Chart, lab work & pertinent test results, reviewed documented beta blocker date and time   Airway Mallampati: III  TM Distance: >3 FB Neck ROM: full    Dental no notable dental hx.    Pulmonary neg pulmonary ROS, shortness of breath, sleep apnea and Continuous Positive Airway Pressure Ventilation , COPD,  COPD inhaler, former smoker,    Pulmonary exam normal breath sounds clear to auscultation       Cardiovascular Exercise Tolerance: Good hypertension, negative cardio ROS Normal cardiovascular exam Rhythm:regular Rate:Normal     Neuro/Psych PSYCHIATRIC DISORDERS  Neuromuscular disease negative neurological ROS  negative psych ROS   GI/Hepatic negative GI ROS, Neg liver ROS, GERD  ,  Endo/Other  negative endocrine ROS  Renal/GU negative Renal ROS  negative genitourinary   Musculoskeletal  (+) Arthritis ,   Abdominal   Peds  Hematology negative hematology ROS (+)   Anesthesia Other Findings   Reproductive/Obstetrics negative OB ROS                             Anesthesia Physical  Anesthesia Plan  ASA: 3  Anesthesia Plan: General LMA   Post-op Pain Management:    Induction:   PONV Risk Score and Plan: 3 and Ondansetron, Dexamethasone, Midazolam and Treatment may vary due to age or medical condition  Airway Management Planned:   Additional Equipment:   Intra-op Plan:   Post-operative Plan:   Informed Consent: I have reviewed the patients History and Physical, chart, labs and discussed the procedure including the risks, benefits and alternatives for the proposed anesthesia with the patient or authorized representative who has indicated his/her understanding and acceptance.     Dental Advisory Given  Plan Discussed with:  CRNA  Anesthesia Plan Comments:         Anesthesia Quick Evaluation

## 2022-05-30 ENCOUNTER — Encounter: Payer: Self-pay | Admitting: Podiatry

## 2022-07-03 ENCOUNTER — Other Ambulatory Visit: Payer: Self-pay

## 2022-07-03 DIAGNOSIS — B372 Candidiasis of skin and nail: Secondary | ICD-10-CM

## 2022-07-03 NOTE — Telephone Encounter (Signed)
Encounter opened in error. KW 

## 2022-07-07 ENCOUNTER — Other Ambulatory Visit: Payer: Self-pay

## 2022-07-07 DIAGNOSIS — B372 Candidiasis of skin and nail: Secondary | ICD-10-CM

## 2022-07-07 MED ORDER — NYSTATIN-TRIAMCINOLONE 100000-0.1 UNIT/GM-% EX OINT: 1.0000 | TOPICAL_OINTMENT | Freq: Two times a day (BID) | CUTANEOUS | 1 refills | Status: DC

## 2022-09-14 ENCOUNTER — Other Ambulatory Visit: Payer: Self-pay | Admitting: Certified Nurse Midwife

## 2022-09-14 DIAGNOSIS — B372 Candidiasis of skin and nail: Secondary | ICD-10-CM

## 2022-11-17 ENCOUNTER — Ambulatory Visit: Payer: BC Managed Care – PPO | Admitting: Dermatology

## 2022-11-17 VITALS — BP 130/74

## 2022-11-17 DIAGNOSIS — L578 Other skin changes due to chronic exposure to nonionizing radiation: Secondary | ICD-10-CM

## 2022-11-17 DIAGNOSIS — Z1283 Encounter for screening for malignant neoplasm of skin: Secondary | ICD-10-CM

## 2022-11-17 DIAGNOSIS — L814 Other melanin hyperpigmentation: Secondary | ICD-10-CM

## 2022-11-17 DIAGNOSIS — L820 Inflamed seborrheic keratosis: Secondary | ICD-10-CM | POA: Diagnosis not present

## 2022-11-17 DIAGNOSIS — D229 Melanocytic nevi, unspecified: Secondary | ICD-10-CM

## 2022-11-17 DIAGNOSIS — L570 Actinic keratosis: Secondary | ICD-10-CM | POA: Diagnosis not present

## 2022-11-17 DIAGNOSIS — L821 Other seborrheic keratosis: Secondary | ICD-10-CM

## 2022-11-17 DIAGNOSIS — I872 Venous insufficiency (chronic) (peripheral): Secondary | ICD-10-CM | POA: Diagnosis not present

## 2022-11-17 DIAGNOSIS — Z86018 Personal history of other benign neoplasm: Secondary | ICD-10-CM

## 2022-11-17 NOTE — Patient Instructions (Addendum)
Cryotherapy Aftercare  Wash gently with soap and water everyday.   Apply Vaseline and Band-Aid daily until healed.     Due to recent changes in healthcare laws, you may see results of your pathology and/or laboratory studies on MyChart before the doctors have had a chance to review them. We understand that in some cases there may be results that are confusing or concerning to you. Please understand that not all results are received at the same time and often the doctors may need to interpret multiple results in order to provide you with the best plan of care or course of treatment. Therefore, we ask that you please give us 2 business days to thoroughly review all your results before contacting the office for clarification. Should we see a critical lab result, you will be contacted sooner.   If You Need Anything After Your Visit  If you have any questions or concerns for your doctor, please call our main line at [phone] and press option 4 to reach your doctor's medical assistant. If no one answers, please leave a voicemail as directed and we will return your call as soon as possible. Messages left after 4 pm will be answered the following business day.   You may also send us a message via MyChart. We typically respond to MyChart messages within 1-2 business days.  For prescription refills, please ask your pharmacy to contact our office. Our fax number is [phone].  If you have an urgent issue when the clinic is closed that cannot wait until the next business day, you can page your doctor at the number below.    Please note that while we do our best to be available for urgent issues outside of office hours, we are not available 24/7.   If you have an urgent issue and are unable to reach us, you may choose to seek medical care at your doctor's office, retail clinic, urgent care center, or emergency room.  If you have a medical emergency, please immediately call 911 or go to the  emergency department.  Pager Numbers  - Dr. Kowalski: [phone]  - Dr. Moye: [phone]  - Dr. Stewart: [phone]  In the event of inclement weather, please call our main line at [phone] for an update on the status of any delays or closures.  Dermatology Medication Tips: Please keep the boxes that topical medications come in in order to help keep track of the instructions about where and how to use these. Pharmacies typically print the medication instructions only on the boxes and not directly on the medication tubes.   If your medication is too expensive, please contact our office at [phone] option 4 or send us a message through MyChart.   We are unable to tell what your co-pay for medications will be in advance as this is different depending on your insurance coverage. However, we may be able to find a substitute medication at lower cost or fill out paperwork to get insurance to cover a needed medication.   If a prior authorization is required to get your medication covered by your insurance company, please allow us 1-2 business days to complete this process.  Drug prices often vary depending on where the prescription is filled and some pharmacies may offer cheaper prices.  The website www.goodrx.com contains coupons for medications through different pharmacies. The prices here do not account for what the cost may be with help from insurance (it may be cheaper with your insurance), but the website can   give you the price if you did not use any insurance.  - You can print the associated coupon and take it with your prescription to the pharmacy.  - You may also stop by our office during regular business hours and pick up a GoodRx coupon card.  - If you need your prescription sent electronically to a different pharmacy, notify our office through [facility] MyChart or by phone at [phone] option 4.     Si Usted Necesita Algo Despus de Su Visita  Tambin puede  enviarnos un mensaje a travs de MyChart. Por lo general respondemos a los mensajes de MyChart en el transcurso de 1 a 2 das hbiles.  Para renovar recetas, por favor pida a su farmacia que se ponga en contacto con nuestra oficina. Nuestro nmero de fax es el [phone].  Si tiene un asunto urgente cuando la clnica est cerrada y que no puede esperar hasta el siguiente da hbil, puede llamar/localizar a su doctor(a) al nmero que aparece a continuacin.   Por favor, tenga en cuenta que aunque hacemos todo lo posible para estar disponibles para asuntos urgentes fuera del horario de oficina, no estamos disponibles las 24 horas del da, los 7 das de la semana.   Si tiene un problema urgente y no puede comunicarse con nosotros, puede optar por buscar atencin mdica  en el consultorio de su doctor(a), en una clnica privada, en un centro de atencin urgente o en una sala de emergencias.  Si tiene una emergencia mdica, por favor llame inmediatamente al 911 o vaya a la sala de emergencias.  Nmeros de bper  - Dr. Kowalski: [phone]  - Dra. Moye: [phone]  - Dra. Stewart: [phone]  En caso de inclemencias del tiempo, por favor llame a nuestra lnea principal al [phone] para una actualizacin sobre el estado de cualquier retraso o cierre.  Consejos para la medicacin en dermatologa: Por favor, guarde las cajas en las que vienen los medicamentos de uso tpico para ayudarle a seguir las instrucciones sobre dnde y cmo usarlos. Las farmacias generalmente imprimen las instrucciones del medicamento slo en las cajas y no directamente en los tubos del medicamento.   Si su medicamento es muy caro, por favor, pngase en contacto con nuestra oficina llamando al [phone] y presione la opcin 4 o envenos un mensaje a travs de MyChart.   No podemos decirle cul ser su copago por los medicamentos por adelantado ya que esto es diferente dependiendo de la cobertura de su seguro.  Sin embargo, es posible que podamos encontrar un medicamento sustituto a menor costo o llenar un formulario para que el seguro cubra el medicamento que se considera necesario.   Si se requiere una autorizacin previa para que su compaa de seguros cubra su medicamento, por favor permtanos de 1 a 2 das hbiles para completar este proceso.  Los precios de los medicamentos varan con frecuencia dependiendo del lugar de dnde se surte la receta y alguna farmacias pueden ofrecer precios ms baratos.  El sitio web www.goodrx.com tiene cupones para medicamentos de diferentes farmacias. Los precios aqu no tienen en cuenta lo que podra costar con la ayuda del seguro (puede ser ms barato con su seguro), pero el sitio web puede darle el precio si no utiliz ningn seguro.  - Puede imprimir el cupn correspondiente y llevarlo con su receta a la farmacia.  - Tambin puede pasar por nuestra oficina durante el horario de atencin regular y recoger una tarjeta de cupones de GoodRx.  -   Si necesita que su receta se enve electrnicamente a una farmacia diferente, informe a nuestra oficina a travs de MyChart de [place] o por telfono llamando al [phone] y presione la opcin 4.  

## 2022-11-17 NOTE — Progress Notes (Unsigned)
Follow-Up Visit   Subjective  Mackenzie [name] is a 67 y.o. female who presents for the following: Annual Exam (History of dysplastic nevus - The patient presents for Total-Body Skin Exam (TBSE) for skin cancer screening and mole check.  The patient has spots, moles and lesions to be evaluated, some may be new or changing and the patient has concerns that these could be cancer./).  The following portions of the chart were reviewed this encounter and updated as appropriate:   Tobacco  Allergies  Meds  Problems  Med Hx  Surg Hx  Fam Hx     Review of Systems:  No other skin or systemic complaints except as noted in HPI or Assessment and Plan.  Objective  Well appearing patient in no apparent distress; mood and affect are within normal limits.  A full examination was performed including scalp, head, eyes, ears, nose, lips, neck, chest, axillae, abdomen, back, buttocks, bilateral upper extremities, bilateral lower extremities, hands, feet, fingers, toes, fingernails, and toenails. All findings within normal limits unless otherwise noted below.  Nose Erythematous thin papules/macules with gritty scale.   Chest x 1, right lower leg x 1 Erythematous stuck-on, waxy papule or plaque   Assessment & Plan   History of Dysplastic Nevi - No evidence of recurrence today - Recommend regular full body skin exams - Recommend daily broad spectrum sunscreen SPF 30+ to sun-exposed areas, reapply every 2 hours as needed.  - Call if any new or changing lesions are noted between office visits  Lentigines - Scattered tan macules - Due to sun exposure - Benign-appearing, observe - Recommend daily broad spectrum sunscreen SPF 30+ to sun-exposed areas, reapply every 2 hours as needed. - Call for any changes  Seborrheic Keratoses - Stuck-on, waxy, tan-brown papules and/or plaques  - Benign-appearing - Discussed benign etiology and prognosis. - Observe - Call for any changes  Melanocytic Nevi -  Tan-brown and/or pink-flesh-colored symmetric macules and papules - Benign appearing on exam today - Observation - Call clinic for new or changing moles - Recommend daily use of broad spectrum spf 30+ sunscreen to sun-exposed areas.   Hemangiomas - Red papules - Discussed benign nature - Observe - Call for any changes  Actinic Damage - Chronic condition, secondary to cumulative UV/sun exposure - diffuse scaly erythematous macules with underlying dyspigmentation - Recommend daily broad spectrum sunscreen SPF 30+ to sun-exposed areas, reapply every 2 hours as needed.  - Staying in the shade or wearing long sleeves, sun glasses (UVA+UVB protection) and wide brim hats (4-inch brim around the entire circumference of the hat) are also recommended for sun protection.  - Call for new or changing lesions.  Skin cancer screening performed today.  AK (actinic keratosis) Nose Destruction of lesion - Nose Complexity: simple   Destruction method: cryotherapy   Informed consent: discussed and consent obtained   Timeout:  patient name, date of birth, surgical site, and procedure verified Lesion destroyed using liquid nitrogen: Yes   Region frozen until ice ball extended beyond lesion: Yes   Outcome: patient tolerated procedure well with no complications   Post-procedure details: wound care instructions given    Venous stasis dermatitis of lower extremities With Shamberg's Purpura Bilateral lower legs Stasis changes  -  Continue Mometasone cream as needed for itch Benign-appearing.  Observation.  Call clinic for new or changing lesions.  Recommend daily use of broad spectrum spf 30+ sunscreen to sun-exposed areas.   Inflamed seborrheic keratosis Chest x 1, right lower  leg x 1 Destruction of lesion - Chest x 1, right lower leg x 1 Complexity: simple   Destruction method: cryotherapy   Informed consent: discussed and consent obtained   Timeout:  patient name, date of birth, surgical site,  and procedure verified Lesion destroyed using liquid nitrogen: Yes   Region frozen until ice ball extended beyond lesion: Yes   Outcome: patient tolerated procedure well with no complications   Post-procedure details: wound care instructions given    Return in about 1 year (around 11/17/2023) for TBSE. Documentation: I have reviewed the above documentation for accuracy and completeness, and I agree with the above.  Sarina Ser, MD

## 2022-11-18 ENCOUNTER — Encounter: Payer: Self-pay | Admitting: Dermatology

## 2022-11-19 ENCOUNTER — Encounter: Payer: Self-pay | Admitting: Obstetrics and Gynecology

## 2022-11-24 ENCOUNTER — Other Ambulatory Visit: Payer: Self-pay | Admitting: Certified Nurse Midwife

## 2022-11-24 ENCOUNTER — Other Ambulatory Visit: Payer: Self-pay | Admitting: Obstetrics and Gynecology

## 2022-11-24 ENCOUNTER — Other Ambulatory Visit: Payer: Self-pay | Admitting: Dermatology

## 2022-11-24 DIAGNOSIS — B372 Candidiasis of skin and nail: Secondary | ICD-10-CM

## 2022-11-24 DIAGNOSIS — R202 Paresthesia of skin: Secondary | ICD-10-CM

## 2022-11-24 DIAGNOSIS — Z1231 Encounter for screening mammogram for malignant neoplasm of breast: Secondary | ICD-10-CM

## 2022-11-24 DIAGNOSIS — L2089 Other atopic dermatitis: Secondary | ICD-10-CM

## 2022-12-24 ENCOUNTER — Telehealth: Payer: Self-pay

## 2022-12-24 NOTE — Telephone Encounter (Signed)
Pharmacy aware to send refill request to Dr Jean Rosenthal at [facility]

## 2023-01-16 ENCOUNTER — Encounter: Payer: Self-pay | Admitting: Emergency Medicine

## 2023-01-16 ENCOUNTER — Emergency Department: Payer: BC Managed Care – PPO

## 2023-01-16 ENCOUNTER — Other Ambulatory Visit: Payer: Self-pay

## 2023-01-16 ENCOUNTER — Emergency Department
Admission: EM | Admit: 2023-01-16 | Discharge: 2023-01-16 | Disposition: A | Discharge status: Home / Self Care | Payer: BC Managed Care – PPO | Attending: Student in an Organized Health Care Education/Training Program | Admitting: Student in an Organized Health Care Education/Training Program

## 2023-01-16 DIAGNOSIS — I10 Essential (primary) hypertension: Secondary | ICD-10-CM | POA: Insufficient documentation

## 2023-01-16 DIAGNOSIS — S0083XA Contusion of other part of head, initial encounter: Secondary | ICD-10-CM | POA: Diagnosis not present

## 2023-01-16 DIAGNOSIS — Y9389 Activity, other specified: Secondary | ICD-10-CM | POA: Insufficient documentation

## 2023-01-16 DIAGNOSIS — W01198A Fall on same level from slipping, tripping and stumbling with subsequent striking against other object, initial encounter: Secondary | ICD-10-CM | POA: Diagnosis not present

## 2023-01-16 DIAGNOSIS — S2232XA Fracture of one rib, left side, initial encounter for closed fracture: Secondary | ICD-10-CM | POA: Diagnosis not present

## 2023-01-16 DIAGNOSIS — J449 Chronic obstructive pulmonary disease, unspecified: Secondary | ICD-10-CM | POA: Diagnosis not present

## 2023-01-16 DIAGNOSIS — S299XXA Unspecified injury of thorax, initial encounter: Secondary | ICD-10-CM | POA: Diagnosis present

## 2023-01-16 DIAGNOSIS — W19XXXA Unspecified fall, initial encounter: Secondary | ICD-10-CM

## 2023-01-16 MED ORDER — NAPROXEN 500 MG PO TABS: 500.0000 mg | ORAL_TABLET | Freq: Two times a day (BID) | ORAL | 0 refills | Status: AC

## 2023-01-16 NOTE — ED Provider Notes (Signed)
[facility] Provider Note    Event Date/Time   First MD Initiated Contact with Patient 01/16/23 [phone]     (approximate)   History   Fall   HPI  Mackenzie [name] is a 67 y.o. female   presents to the ED with complaint of left-sided rib pain that started Sunday after a fall.  Patient states that she tripped over the doorway while carrying some items and landed on her left side.  Patient states she hit her face but there was no LOC and she denies any nausea or vomiting since that time.  Today she complains of left-sided rib pain that has worsened.  She denies any shortness of breath or previous cardiac issues.  Patient has a history of hypertension, COPD, dyspnea on exertion and ADHD.      Physical Exam   Triage Vital Signs: ED Triage Vitals  Enc Vitals Group     BP 01/16/23 0647 119/61     Pulse Rate 01/16/23 0647 67     Resp 01/16/23 0647 18     Temp 01/16/23 0647 98.4 F (36.9 C)     Temp Source 01/16/23 0647 Oral     SpO2 01/16/23 0647 100 %     Weight 01/16/23 0645 263 lb 3.7 oz (119.4 kg)     Height 01/16/23 0645 5\' 8"  (1.727 m)     Head Circumference --      Peak Flow --      Pain Score 01/16/23 0646 9     Pain Loc --      Pain Edu? --      Excl. in GC? --     Most recent vital signs: Vitals:   01/16/23 0647 01/16/23 1013  BP: 119/61 122/70  Pulse: 67 63  Resp: 18 16  Temp: 98.4 F (36.9 C)   SpO2: 100% 93%     General: Awake, no distress.  Able to stand and ambulate without any assistance. CV:  Good peripheral perfusion.  Heart regular rate and rhythm. Resp:  Normal effort.  Lungs are clear bilaterally. Abd:  No distention.  Other:  Alert, talkative, markedly tender left lateral ribs with point tenderness fourth, fifth, sixth and seventh rib area without deformity.  No cervical tenderness on palpation.  Nontender thoracic or lumbar spine.  Patient is able to move upper extremities without any difficulty and ambulation was  observed.   ED Results / Procedures / Treatments   Labs (all labs ordered are listed, but only abnormal results are displayed) Labs Reviewed - No data to display   RADIOLOGY CT head without contrast per radiology is negative for intracranial injury or fracture.. Left rib x-ray images reviewed and interpreted by myself independent of the radiologist and shows possible fracture nondisplaced of the sixth rib.  Radiology report confirms acute fracture of the anterior  left sixth rib.   PROCEDURES:  Critical Care performed:   Procedures   MEDICATIONS ORDERED IN ED: Medications - No data to display   IMPRESSION / MDM / ASSESSMENT AND PLAN / ED COURSE  I reviewed the triage vital signs and the nursing notes.   Differential diagnosis includes, but is not limited to, rib fracture, contusion, head injury, scalp fracture, cervical strain, musculoskeletal pain secondary to fall.  67 year old female presents to the ED with complaint of left rib pain after a fall that occurred approximately 5 days ago.  This was a mechanical fall and patient denies any loss of consciousness.  She  has continued to have left rib pain and x-rays show one fracture involving the sixth rib.  Patient was made aware.  We discussed using a pillow anytime she believes that she may cough or sneeze and to take deep breaths frequently to avoid developing pneumonia due to shallow breathing.  A prescription for naproxen was sent to the pharmacy for her to begin taking for inflammation and pain.  She is to follow-up with her PCP if any continued problems.      Patient's presentation is most consistent with acute illness / injury with system symptoms.  FINAL CLINICAL IMPRESSION(S) / ED DIAGNOSES   Final diagnoses:  Closed fracture of one rib of left side, initial encounter  Contusion of face, initial encounter  Fall, initial encounter     Rx / DC Orders   ED Discharge Orders          Ordered    naproxen  (NAPROSYN) 500 MG tablet  2 times daily with meals        01/16/23 1012             Note:  This document was prepared using Dragon voice recognition software and may include unintentional dictation errors.   Mackenzie Rumps, PA-C 01/16/23 1248    Willy Eddy, MD 01/16/23 [phone]

## 2023-01-16 NOTE — ED Triage Notes (Signed)
Pt presents ambulatory to triage via POV with complaints of L sided rib pain that started Sunday after a fall. Pt states she tripping over some shoes and landing on her L side on a wooden deck. Pt notes hitting the L side of her face without LOC. Rates the pain 9/10. A&Ox4 at this time. Denies CP or SOB.

## 2023-01-16 NOTE — Discharge Instructions (Signed)
Follow-up with your primary care provider if any continued problems.  A prescription for naproxen was sent to the pharmacy for you to take as needed for inflammation and pain.  Also if you feel you are going to sneeze or cough place a pillow over your left side which will help temporarily hold your rib and decrease the amount of pain.  Try to take deep breaths frequently to keep your lungs expanded and reduce chance of developing pneumonia.

## 2023-01-16 NOTE — ED Notes (Signed)
Pt resting on stretcher calmly; pt in NAD; visitor remains at bedside.

## 2023-01-23 ENCOUNTER — Telehealth: Payer: Self-pay

## 2023-01-23 NOTE — Telephone Encounter (Signed)
Left message for patient to call office back to schedule annual appt 

## 2023-02-02 ENCOUNTER — Other Ambulatory Visit: Payer: Self-pay | Admitting: Certified Nurse Midwife

## 2023-02-02 DIAGNOSIS — B372 Candidiasis of skin and nail: Secondary | ICD-10-CM

## 2023-03-19 ENCOUNTER — Other Ambulatory Visit: Payer: Self-pay | Admitting: Orthopedic Surgery

## 2023-03-19 DIAGNOSIS — M25551 Pain in right hip: Secondary | ICD-10-CM

## 2023-03-30 ENCOUNTER — Ambulatory Visit
Admission: RE | Admit: 2023-03-30 | Discharge: 2023-03-30 | Disposition: A | Discharge status: Home / Self Care | Payer: BC Managed Care – PPO | Source: Ambulatory Visit | Attending: Obstetrics and Gynecology | Admitting: Obstetrics and Gynecology

## 2023-03-30 DIAGNOSIS — Z1231 Encounter for screening mammogram for malignant neoplasm of breast: Secondary | ICD-10-CM | POA: Insufficient documentation

## 2023-04-06 NOTE — Telephone Encounter (Signed)
As of 04/06/2023 pt had not contacted office to schedule annual exam.

## 2023-10-02 ENCOUNTER — Other Ambulatory Visit: Payer: Self-pay | Admitting: Internal Medicine

## 2023-10-02 ENCOUNTER — Ambulatory Visit
Admission: RE | Admit: 2023-10-02 | Discharge: 2023-10-02 | Disposition: A | Discharge status: Home / Self Care | Payer: 59 | Source: Ambulatory Visit | Attending: Internal Medicine | Admitting: Internal Medicine

## 2023-10-02 DIAGNOSIS — R1031 Right lower quadrant pain: Secondary | ICD-10-CM | POA: Insufficient documentation

## 2023-10-02 MED ORDER — IOHEXOL 300 MG/ML  SOLN
100.0000 mL | Freq: Once | INTRAMUSCULAR | Status: AC | PRN
  Administered 2023-10-02: 100 mL via INTRAVENOUS

## 2023-10-20 ENCOUNTER — Ambulatory Visit (INDEPENDENT_AMBULATORY_CARE_PROVIDER_SITE_OTHER): Payer: 59 | Admitting: Vascular Surgery

## 2023-10-20 ENCOUNTER — Encounter (INDEPENDENT_AMBULATORY_CARE_PROVIDER_SITE_OTHER): Payer: Self-pay | Admitting: Vascular Surgery

## 2023-10-20 VITALS — BP 146/75 | HR 68 | Resp 16 | Wt 252.6 lb

## 2023-10-20 DIAGNOSIS — I10 Essential (primary) hypertension: Secondary | ICD-10-CM | POA: Diagnosis not present

## 2023-10-20 DIAGNOSIS — M7989 Other specified soft tissue disorders: Secondary | ICD-10-CM | POA: Diagnosis not present

## 2023-10-20 NOTE — Assessment & Plan Note (Signed)
blood pressure control important in reducing the progression of atherosclerotic disease. On appropriate oral medications.

## 2023-10-20 NOTE — Assessment & Plan Note (Signed)
Recommend:  I have had a long discussion with the patient regarding swelling and why it  causes symptoms.  Patient will continue wearing graduated compression on a daily basis. The patient will  wear the stockings first thing in the morning and removing them in the evening. The patient is instructed specifically not to sleep in the stockings.   In addition, behavioral modification will be initiated.  This will include frequent elevation, use of over the counter pain medications and exercise such as walking.  Consideration for a lymph pump will also be made based upon the effectiveness of conservative therapy.  This would help to improve the edema control and prevent sequela such as ulcers and infections   Patient should undergo duplex ultrasound of the venous system to ensure that DVT or reflux is not present.  The patient will follow-up with me after the ultrasound.

## 2023-10-20 NOTE — Progress Notes (Signed)
Patient ID: Mackenzie [name], female   DOB: September 16, 1955, 68 y.o.   MRN: [id number]  Chief Complaint  Patient presents with   New Patient (Initial Visit)    Ref Sparks leg swelling    HPI Mackenzie [name] is a 68 y.o. female.  I am asked to see the patient by Dr. Judithann Sheen for evaluation of leg swelling.  She says that her legs are actually a lot better than they were when a referral was initially put in a few weeks ago.  She was given torsemide and began wearing compression socks and these 2 things made a dramatic improvement in terms of the swelling.  The left lower extremity which is the 1 she has had 2 major orthopedic surgeries on her hip is the more severely affected of the 2 legs.  These major orthopedic surgeries have severely impaired her ambulation although she does still walk with a cane.  She denies any open wounds or infection.  No fevers or chills.  No known history of DVT or superficial thrombophlebitis to her knowledge.     Past Medical History:  Diagnosis Date   ADD (attention deficit disorder)    Anemia    iron deficiency, b12, taking iron   Anxiety    Barrett's esophagus 2019   Cold sore    Colon polyps    COPD (chronic obstructive pulmonary disease) (HCC)    Diverticulosis    Dysplastic nevus 06/22/2014   R upper back paraspinal - moderate   Dyspnea    on exertion   Environmental allergies    Essential hypertension    Genital herpes 05/2019   neg culture, positive type 2 IgG.   GERD (gastroesophageal reflux disease)    History of hiatal hernia    History of nerve impingement    Sees Dr. Cherylann Ratel, Pain Management   Hyperlipidemia    Lichen sclerosus    Obesity    Osteoarthritis    hands, feet, knees   Other abnormal auditory perceptions, bilateral 2019   hip and back issues   RLS (restless legs syndrome)    Shingles 09/2018   Sleep apnea    CPAP   Vitamin B12 deficiency    Weakness of back    right side back and leg nerves burned / now burning left side    Wears contact lenses     Past Surgical History:  Procedure Laterality Date   AIKEN OSTEOTOMY Left 08/11/2019   Procedure: PHALANX OSTEOTOMY AKIN;  Surgeon: Recardo Evangelist, DPM;  Location: [facility];  Service: Podiatry;  Laterality: Left;   BONE EXCISION Left 08/11/2019   Procedure: SADDLEBONE;  Surgeon: Recardo Evangelist, DPM;  Location: [facility];  Service: Podiatry;  Laterality: Left;  sleep apnea   BREAST CYST ASPIRATION Right 1998   neg   broken leg repair Right    BUNIONECTOMY Right 2010   pins in toes of right foot   COLONOSCOPY  2014   COLONOSCOPY WITH PROPOFOL N/A 05/24/2018   Procedure: COLONOSCOPY WITH PROPOFOL;  Surgeon: Christena Deem, MD;  Location: [facility];  Service: Endoscopy;  Laterality: N/A;   ESOPHAGOGASTRODUODENOSCOPY (EGD) WITH PROPOFOL N/A 05/24/2018   Procedure: ESOPHAGOGASTRODUODENOSCOPY (EGD) WITH PROPOFOL;  Surgeon: Christena Deem, MD;  Location: [facility] END[facility];  Service: Endoscopy;  Laterality: N/A;   eye plugs Bilateral    plugs placed for dry eyes   HALLUX VALGUS AUSTIN Left 08/11/2019   Procedure: AUSTIN (MITCHELL);  Surgeon: Recardo Evangelist, DPM;  Location: [facility];  Service: Podiatry;  Laterality: Left;   INDUCED ABORTION     JOINT REPLACEMENT Right    hip   MINOR HARDWARE REMOVAL Left 05/28/2022   Procedure: MINOR HARDWARE REMOVAL;  Surgeon: Gwyneth Revels, DPM;  Location: [facility];  Service: Podiatry;  Laterality: Left;  sleep apnea   ORIF TOE FRACTURE Left 09/22/2019   Procedure: REMOVAL OF SCREWS WITH OPEN REDUCTION INTERNAL FIXATION (ORIF) METATARSAL (GREAT TOE);  Surgeon: Recardo Evangelist, DPM;  Location: [facility];  Service: Podiatry;  Laterality: Left;  pt's splint was removed and cast was applied in PACU by MD   PLANTAR FASCIA SURGERY Left 2005   TOTAL HIP ARTHROPLASTY Right 08/11/2018   Procedure: TOTAL HIP ARTHROPLASTY;  Surgeon: Donato Heinz, MD;  Location: [facility] ORS;  Service:  Orthopedics;  Laterality: Right;     Family History  Problem Relation Age of Onset   Breast cancer Cousin 40       mat second cousin   Liver cancer Maternal Aunt    Alcoholism Mother    Hypertension Mother    Diabetes Mother        type 1   Congestive Heart Failure Mother    Obesity Mother    Diabetes Maternal Grandmother        type 2   Breast cancer Paternal Grandmother 99   Arthritis Sister    Diabetes Sister    Stroke Brother 64   Alcoholism Maternal Grandfather       Social History   Tobacco Use   Smoking status: Former    Current packs/day: 0.00    Average packs/day: 0.5 packs/day for 10.0 years (5.0 ttl pk-yrs)    Types: Cigarettes    Start date: 07/22/2006    Quit date: 07/22/2016    Years since quitting: 7.2   Smokeless tobacco: Never  Vaping Use   Vaping status: Never Used  Substance Use Topics   Alcohol use: Not Currently    Alcohol/week: 1.0 - 4.0 standard drink of alcohol    Types: 1 - 2 Shots of liquor per week    Comment: occasional   Drug use: No     No Known Allergies  Current Outpatient Medications  Medication Sig Dispense Refill   ALPRAZolam (XANAX) 1 MG tablet Take by mouth.     baclofen (LIORESAL) 10 MG tablet TAKE 1 TABLET BY MOUTH THREE TIMES A DAY AS NEEDED FOR MUSCLE SPASMS 40 tablet 1   Calcium Carb-Cholecalciferol (CALCIUM 500 + D PO) Take by mouth daily.     cholecalciferol (VITAMIN D3) 25 MCG (1000 UT) tablet Take 1,000 Units by mouth every 3 (three) days.     cyanocobalamin (,VITAMIN B-12,) 1000 MCG/ML injection Inject into the muscle every 30 (thirty) days.      gabapentin (NEURONTIN) 300 MG capsule TAKE 1 CAPSULE BY MOUTH THREE TIMES A DAY     lidocaine (XYLOCAINE) 5 % ointment APPLY 1 APPLICATION TOPICALLY 2 (TWO) TIMES DAILY AS NEEDED. 50 g 17   mometasone (ELOCON) 0.1 % cream APPLY 1 APPLICATION. TOPICALLY DAILY AS NEEDED (RASH). 45 g 5   naproxen (NAPROSYN) 500 MG tablet Take 1 tablet (500 mg total) by mouth 2 (two) times  daily with a meal. 20 tablet 0   nystatin-triamcinolone ointment (MYCOLOG) APPLY TO AFFECTED AREA TWICE A DAY 30 g 1   OVER THE COUNTER MEDICATION at bedtime. Green Compass CBD gummy sleep aid     polyethylene glycol (MIRALAX / GLYCOLAX) packet Take 17  g by mouth at bedtime.     valACYclovir (VALTREX) 500 MG tablet TAKE 1 TABLET BY MOUTH EVERY DAY 90 tablet 0   bisoprolol (ZEBETA) 5 MG tablet Take 5 mg by mouth daily.     budesonide-formoterol (SYMBICORT) 160-4.5 MCG/ACT inhaler Inhale 2 puffs into the lungs 2 (two) times daily. Am and lunch     clobetasol ointment (TEMOVATE) 0.05 % APPLY VAGINALLY TWICE A NIGHT FOR NEXT 8 WEEKS 60 g 5   cyclobenzaprine (FLEXERIL) 10 MG tablet Take 10 mg by mouth daily as needed for muscle spasms.      diclofenac (VOLTAREN) 75 MG EC tablet Take 1 tablet (75 mg total) by mouth 2 (two) times daily as needed. 60 tablet 2   DULoxetine (CYMBALTA) 60 MG capsule Take 60 mg by mouth daily.     ferrous sulfate 325 (65 FE) MG tablet Take 325 mg by mouth 2 (two) times daily with a meal.   3   hydrochlorothiazide (HYDRODIURIL) 25 MG tablet Take 25 mg by mouth daily. am (Patient not taking: Reported on 05/22/2022)  11   hydrocortisone 2.5 % cream Apply topically 2 (two) times daily as needed (Rash). Twice daily for up to 1 week to eyelids as needed for rash. 30 g 0   oxyCODONE-acetaminophen (PERCOCET) 5-325 MG tablet Take 1-2 tablets by mouth every 6 (six) hours as needed for severe pain. Max 6 tabs per day 30 tablet 0   Pediatric Multivit-Minerals (FLINTSTONES COMPLETE PO) Take by mouth daily.     rosuvastatin (CRESTOR) 10 MG tablet Take 10 mg by mouth daily.     topiramate (TOPAMAX) 50 MG tablet Take 1 tablet by mouth 2 (two) times daily. (Patient not taking: Reported on 05/22/2022)     No current facility-administered medications for this visit.      REVIEW OF SYSTEMS (Negative unless checked)  Constitutional: [] Weight loss  [] Fever  [] Chills Cardiac: [] Chest pain    [] Chest pressure   [] Palpitations   [] Shortness of breath when laying flat   [] Shortness of breath at rest   [] Shortness of breath with exertion. Vascular:  [] Pain in legs with walking   [] Pain in legs at rest   [] Pain in legs when laying flat   [] Claudication   [] Pain in feet when walking  [] Pain in feet at rest  [] Pain in feet when laying flat   [] History of DVT   [] Phlebitis   [x] Swelling in legs   [] Varicose veins   [] Non-healing ulcers Pulmonary:   [] Uses home oxygen   [] Productive cough   [] Hemoptysis   [] Wheeze  [] COPD   [] Asthma Neurologic:  [] Dizziness  [] Blackouts   [] Seizures   [] History of stroke   [] History of TIA  [] Aphasia   [] Temporary blindness   [] Dysphagia   [] Weakness or numbness in arms   [x] Weakness or numbness in legs Musculoskeletal:  [x] Arthritis   [] Joint swelling   [x] Joint pain   [] Low back pain Hematologic:  [] Easy bruising  [] Easy bleeding   [] Hypercoagulable state   [x] Anemic  [] Hepatitis Gastrointestinal:  [] Blood in stool   [] Vomiting blood  [x] Gastroesophageal reflux/heartburn   [] Abdominal pain Genitourinary:  [] Chronic kidney disease   [] Difficult urination  [] Frequent urination  [] Burning with urination   [] Hematuria Skin:  [] Rashes   [] Ulcers   [] Wounds Psychological:  [x] History of anxiety   []  History of major depression.    Physical Exam BP (!) 146/75   Pulse 68   Resp 16   Wt 252 lb 9.6 oz (114.6  kg)   BMI 38.41 kg/m  Gen:  WD/WN, NAD Head: [place]/AT, No temporalis wasting.  Ear/Nose/Throat: Hearing grossly intact, nares w/o erythema or drainage, oropharynx w/o Erythema/Exudate Eyes: Conjunctiva clear, sclera non-icteric  Neck: trachea midline.  No JVD.  Pulmonary:  Good air movement, respirations not labored, no use of accessory muscles  Cardiac: RRR, no JVD Vascular:  Vessel Right Left  Radial Palpable Palpable                                   Gastrointestinal:. No masses, surgical incisions, or scars. Musculoskeletal: M/S 5/5  throughout.  Extremities without ischemic changes.  No deformity or atrophy. 1+ BLE edema. Neurologic: Sensation grossly intact in extremities.  Symmetrical.  Speech is fluent. Motor exam as listed above. Psychiatric: Judgment intact, Mood & affect appropriate for pt's clinical situation. Dermatologic: No rashes or ulcers noted.  No cellulitis or open wounds.    Radiology CT ABDOMEN PELVIS W CONTRAST Result Date: 10/07/2023 CLINICAL DATA:  Right-sided abdominal pain since Wednesday. EXAM: CT ABDOMEN AND PELVIS WITH CONTRAST TECHNIQUE: Multidetector CT imaging of the abdomen and pelvis was performed using the standard protocol following bolus administration of intravenous contrast. RADIATION DOSE REDUCTION: This exam was performed according to the departmental dose-optimization program which includes automated exposure control, adjustment of the mA and/or kV according to patient size and/or use of iterative reconstruction technique. CONTRAST:  OMNIPAQUE IOHEXOL 300 MG/ML  SOLN COMPARISON:  None Available. FINDINGS: Lower chest: No basilar airspace disease or pleural effusion. There is a small to moderate hiatal hernia. Hepatobiliary: Small right lobe hepatic cyst as well as low-density lesions too small to characterize. No further follow-up imaging is recommended. Multiple calcified gallstones in the gallbladder. No abnormal gallbladder distension, wall thickening or pericholecystic inflammation. No biliary dilatation. Pancreas: Unremarkable. No pancreatic ductal dilatation or surrounding inflammatory changes. Spleen: Normal in size without focal abnormality. Adrenals/Urinary Tract: No adrenal nodule. No hydronephrosis or perinephric edema. Homogeneous renal enhancement with symmetric excretion on delayed phase imaging. No renal calculi or suspicious renal lesion. The urinary bladder is completely empty, partially obscured by streak artifact from right hip arthroplasty. Stomach/Bowel: Detailed bowel  assessment is limited in the absence of enteric contrast. Small to moderate-sized hiatal hernia. The stomach is otherwise nondistended. No small bowel obstruction or inflammatory change. There is fecalization of distal small bowel contents. The appendix is not visualized, history of appendectomy. Small to moderate volume of stool in the colon. Colonic diverticulosis, prominent in the sigmoid colon. There is no diverticulitis. No colonic inflammatory change. Vascular/Lymphatic: Aortic atherosclerosis. No aortic aneurysm. Patent portal vein. No abdominopelvic adenopathy. Reproductive: Uterus and bilateral adnexa are unremarkable. Other: No free air or ascites. No abdominopelvic collection. Minimal fat containing umbilical hernia. There is no inguinal hernia. Musculoskeletal: Right hip arthroplasty. Postsurgical change in the overlying subcutaneous tissues with a small amount of subcutaneous fluid, likely postoperative seroma. No peripheral enhancement to suggest inflammation. L5-S1 facet hypertrophy and degenerative disc disease with grade 1 anterolisthesis of L5 on S1. IMPRESSION: 1. No acute findings. 2. Gallstones without findings of acute cholecystitis. 3. Colonic diverticulosis without diverticulitis. 4. Small to moderate hiatal hernia. Aortic Atherosclerosis (ICD10-I70.0). Electronically Signed   By: Narda Rutherford M.D.   On: 10/07/2023 16:27    Labs No results found for this or any previous visit (from the past 2160 hours).  Assessment/Plan:  Essential hypertension blood pressure control important in reducing the progression  of atherosclerotic disease. On appropriate oral medications.   Swelling of limb Recommend:  I have had a long discussion with the patient regarding swelling and why it  causes symptoms.  Patient will continue wearing graduated compression on a daily basis. The patient will  wear the stockings first thing in the morning and removing them in the evening. The patient is  instructed specifically not to sleep in the stockings.   In addition, behavioral modification will be initiated.  This will include frequent elevation, use of over the counter pain medications and exercise such as walking.  Consideration for a lymph pump will also be made based upon the effectiveness of conservative therapy.  This would help to improve the edema control and prevent sequela such as ulcers and infections   Patient should undergo duplex ultrasound of the venous system to ensure that DVT or reflux is not present.  The patient will follow-up with me after the ultrasound.       Festus Barren 10/20/2023, 3:39 PM   This note was created with Dragon medical transcription system.  Any errors from dictation are unintentional.

## 2023-11-23 ENCOUNTER — Other Ambulatory Visit: Payer: Self-pay | Admitting: Internal Medicine

## 2023-11-23 DIAGNOSIS — Z1231 Encounter for screening mammogram for malignant neoplasm of breast: Secondary | ICD-10-CM

## 2023-11-26 ENCOUNTER — Ambulatory Visit: Payer: BC Managed Care – PPO | Admitting: Dermatology

## 2023-11-30 ENCOUNTER — Ambulatory Visit: Admitting: Dermatology

## 2023-12-08 ENCOUNTER — Encounter (INDEPENDENT_AMBULATORY_CARE_PROVIDER_SITE_OTHER): Payer: Self-pay | Admitting: Nurse Practitioner

## 2023-12-08 ENCOUNTER — Ambulatory Visit (INDEPENDENT_AMBULATORY_CARE_PROVIDER_SITE_OTHER): Payer: 59

## 2023-12-08 ENCOUNTER — Ambulatory Visit (INDEPENDENT_AMBULATORY_CARE_PROVIDER_SITE_OTHER): Payer: 59 | Admitting: Nurse Practitioner

## 2023-12-08 VITALS — BP 124/72 | HR 64 | Resp 18 | Ht 68.0 in | Wt 247.0 lb

## 2023-12-08 DIAGNOSIS — I10 Essential (primary) hypertension: Secondary | ICD-10-CM

## 2023-12-08 DIAGNOSIS — M7989 Other specified soft tissue disorders: Secondary | ICD-10-CM

## 2023-12-08 NOTE — Progress Notes (Signed)
 Subjective:    Patient ID: Mackenzie [name], female    DOB: Dec 28, 1955, 68 y.o.   MRN: [id number] Chief Complaint  Patient presents with   Follow-up    Patient convenience  Bilateral Reflux     Mackenzie [name] is a 68 y.o. female.  She returns today for noninvasive studies regarding lower extremity edema.  She notes that it was worst in her left lower extremity.  Since she was last seen the swelling has drastically improved.  She has been wearing medical grade compression stockings and being active as well.  Currently there are no open wounds or ulcerations.  No superficial phlebitis or discharge.  Today noninvasive studies show no evidence of DVT or superficial thrombophlebitis bilaterally.  No deep venous insufficiency or superficial venous reflux bilaterally.    Review of Systems  Musculoskeletal:  Positive for gait problem.  Neurological:  Positive for weakness.  All other systems reviewed and are negative.      Objective:   Physical Exam Vitals reviewed.  HENT:     Head: Normocephalic.  Cardiovascular:     Rate and Rhythm: Normal rate.  Pulmonary:     Effort: Pulmonary effort is normal.  Skin:    General: Skin is warm and dry.  Neurological:     Mental Status: She is alert and oriented to person, place, and time.  Psychiatric:        Mood and Affect: Mood normal.        Behavior: Behavior normal.        Thought Content: Thought content normal.        Judgment: Judgment normal.     BP 124/72   Pulse 64   Resp 18   Ht 5\' 8"  (1.727 m)   Wt 247 lb (112 kg)   BMI 37.56 kg/m   Past Medical History:  Diagnosis Date   ADD (attention deficit disorder)    Anemia    iron deficiency, b12, taking iron   Anxiety    Barrett's esophagus 2019   Cold sore    Colon polyps    COPD (chronic obstructive pulmonary disease) (HCC)    Diverticulosis    Dysplastic nevus 06/22/2014   R upper back paraspinal - moderate   Dyspnea    on exertion   Environmental allergies     Essential hypertension    Genital herpes 05/2019   neg culture, positive type 2 IgG.   GERD (gastroesophageal reflux disease)    History of hiatal hernia    History of nerve impingement    Sees Dr. Cherylann Ratel, Pain Management   Hyperlipidemia    Lichen sclerosus    Obesity    Osteoarthritis    hands, feet, knees   Other abnormal auditory perceptions, bilateral 2019   hip and back issues   RLS (restless legs syndrome)    Shingles 09/2018   Sleep apnea    CPAP   Vitamin B12 deficiency    Weakness of back    right side back and leg nerves burned / now burning left side   Wears contact lenses     Social History   Socioeconomic History   Marital status: Single    Spouse name: s.o. 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  Tobacco Use   Smoking status: Former    Current packs/day:  0.00    Average packs/day: 0.5 packs/day for 10.0 years (5.0 ttl pk-yrs)    Types: Cigarettes    Start date: 07/22/2006    Quit date: 07/22/2016    Years since quitting: 7.3   Smokeless tobacco: Never  Vaping Use   Vaping status: Never Used  Substance and Sexual Activity   Alcohol use: Not Currently    Alcohol/week: 1.0 - 4.0 standard drink of alcohol    Types: 1 - 2 Shots of liquor per week    Comment: occasional   Drug use: No   Sexual activity: Not Currently    Partners: Male    Birth control/protection: Post-menopausal  Other Topics Concern   Not on file  Social History Narrative   Not on file   Social Drivers of Health   Financial Resource Strain: Low Risk  (03/24/2023)   Received from [facility]   Overall Financial Resource Strain (CARDIA)    Difficulty of Paying Living Expenses: Not hard at all  Food Insecurity: No Food Insecurity (03/24/2023)   Received from [facility]   Hunger Vital Sign    Worried  About Running Out of Food in the Last Year: Never true    Ran Out of Food in the Last Year: Never true  Transportation Needs: No Transportation Needs (03/24/2023)   Received from [facility] - Transportation    In the past 12 months, has lack of transportation kept you from medical appointments or from getting medications?: No    Lack of Transportation (Non-Medical): No  Physical Activity: Not on file  Stress: Not on file  Social Connections: Not on file  Intimate Partner Violence: Not on file    Past Surgical History:  Procedure Laterality Date   Quintella Reichert OSTEOTOMY Left 08/11/2019   Procedure: PHALANX OSTEOTOMY AKIN;  Surgeon: Recardo Evangelist, DPM;  Location: [facility];  Service: Podiatry;  Laterality: Left;   BONE EXCISION Left 08/11/2019   Procedure: SADDLEBONE;  Surgeon: Recardo Evangelist, DPM;  Location: [facility];  Service: Podiatry;  Laterality: Left;  sleep apnea   BREAST CYST ASPIRATION Right 1998   neg   broken leg repair Right    BUNIONECTOMY Right 2010   pins in toes of right foot   COLONOSCOPY  2014   COLONOSCOPY WITH PROPOFOL N/A 05/24/2018   Procedure: COLONOSCOPY WITH PROPOFOL;  Surgeon: Christena Deem, MD;  Location: [facility];  Service: Endoscopy;  Laterality: N/A;   ESOPHAGOGASTRODUODENOSCOPY (EGD) WITH PROPOFOL N/A 05/24/2018   Procedure: ESOPHAGOGASTRODUODENOSCOPY (EGD) WITH PROPOFOL;  Surgeon: Christena Deem, MD;  Location: [facility] END[facility]COPY;  Service: Endoscopy;  Laterality: N/A;   eye plugs Bilateral    plugs placed for dry eyes   HALLUX VALGUS AUSTIN Left 08/11/2019   Procedure: AUSTIN (MITCHELL);  Surgeon: Recardo Evangelist, DPM;  Location: [facility];  Service: Podiatry;  Laterality: Left;   INDUCED ABORTION     JOINT REPLACEMENT Right    hip   MINOR HARDWARE REMOVAL Left 05/28/2022   Procedure: MINOR HARDWARE REMOVAL;  Surgeon: Gwyneth Revels, DPM;  Location: [facility];  Service:  Podiatry;  Laterality: Left;  sleep apnea   ORIF TOE FRACTURE Left 09/22/2019   Procedure: REMOVAL OF SCREWS WITH OPEN REDUCTION INTERNAL FIXATION (ORIF) METATARSAL (GREAT TOE);  Surgeon: Recardo Evangelist, DPM;  Location: [facility];  Service: Podiatry;  Laterality: Left;  pt's splint was removed and cast was applied in PACU by MD  PLANTAR FASCIA SURGERY Left 2005   TOTAL HIP ARTHROPLASTY Right 08/11/2018   Procedure: TOTAL HIP ARTHROPLASTY;  Surgeon: Donato Heinz, MD;  Location: [facility] ORS;  Service: Orthopedics;  Laterality: Right;    Family History  Problem Relation Age of Onset   Breast cancer Cousin 40       mat second cousin   Liver cancer Maternal Aunt    Alcoholism Mother    Hypertension Mother    Diabetes Mother        type 1   Congestive Heart Failure Mother    Obesity Mother    Diabetes Maternal Grandmother        type 2   Breast cancer Paternal Grandmother 12   Arthritis Sister    Diabetes Sister    Stroke Brother 59   Alcoholism Maternal Grandfather     No Known Allergies     Latest Ref Rng & Units 08/11/2018    4:23 PM 08/11/2018    9:35 AM 07/28/2018    9:50 AM  CBC  WBC 4.0 - 10.5 K/uL 10.3   4.8   Hemoglobin 12.0 - 15.0 g/dL 16.1  09.6  04.5   Hematocrit 36.0 - 46.0 % 40.3  42.0  45.3   Platelets 150 - 400 K/uL 217   263       CMP     Component Value Date/Time   NA 139 08/11/2018 0935   K 3.9 08/11/2018 0935   CL 103 07/28/2018 0950   CO2 30 07/28/2018 0950   GLUCOSE 106 (H) 08/11/2018 0935   BUN 16 07/28/2018 0950   CREATININE 0.73 08/11/2018 1623   CALCIUM 9.1 07/28/2018 0950   PROT 7.3 07/28/2018 0950   ALBUMIN 4.1 07/28/2018 0950   AST 16 07/28/2018 0950   ALT 16 07/28/2018 0950   ALKPHOS 72 07/28/2018 0950   BILITOT 1.4 (H) 07/28/2018 0950   GFRNONAA >60 08/11/2018 1623     No results found.     Assessment & Plan:   1. Swelling of limb (Primary) Recommend:  No surgery or intervention at this point in time.  I have  reviewed my discussion with the patient regarding venous insufficiency and why it causes symptoms. I have discussed with the patient the chronic skin changes that accompany venous insufficiency and the long term sequela such as ulceration. Patient will contnue wearing graduated compression stockings on a daily basis, as this has provided excellent control of his edema. The patient will put the stockings on first thing in the morning and removing them in the evening. The patient is reminded not to sleep in the stockings.  In addition, behavioral modification including elevation during the day will be initiated. Exercise is strongly encouraged.  Previous duplex ultrasound of the lower extremities shows normal deep system, no significant superficial reflux was identified.  Given the patient's good control and lack of any problems regarding the venous insufficiency and lymphedema a lymph pump in not need at this time.    The patient will follow up with me PRN should anything change.  The patient voices agreement with this plan.  2. Primary hypertension Continue antihypertensive medications as already ordered, these medications have been reviewed and there are no changes at this time.   Current Outpatient Medications on File Prior to Visit  Medication Sig Dispense Refill   ALPRAZolam (XANAX) 1 MG tablet Take by mouth.     baclofen (LIORESAL) 10 MG tablet TAKE 1 TABLET BY MOUTH THREE TIMES A DAY AS  NEEDED FOR MUSCLE SPASMS 40 tablet 1   Calcium Carb-Cholecalciferol (CALCIUM 500 + D PO) Take by mouth daily.     cholecalciferol (VITAMIN D3) 25 MCG (1000 UT) tablet Take 1,000 Units by mouth every 3 (three) days.     cyanocobalamin (,VITAMIN B-12,) 1000 MCG/ML injection Inject into the muscle every 30 (thirty) days.      gabapentin (NEURONTIN) 300 MG capsule TAKE 1 CAPSULE BY MOUTH THREE TIMES A DAY     lidocaine (XYLOCAINE) 5 % ointment APPLY 1 APPLICATION TOPICALLY 2 (TWO) TIMES DAILY AS NEEDED. 50 g  17   mometasone (ELOCON) 0.1 % cream APPLY 1 APPLICATION. TOPICALLY DAILY AS NEEDED (RASH). 45 g 5   naproxen (NAPROSYN) 500 MG tablet Take 1 tablet (500 mg total) by mouth 2 (two) times daily with a meal. 20 tablet 0   nystatin-triamcinolone ointment (MYCOLOG) APPLY TO AFFECTED AREA TWICE A DAY 30 g 1   OVER THE COUNTER MEDICATION at bedtime. Green Compass CBD gummy sleep aid     polyethylene glycol (MIRALAX / GLYCOLAX) packet Take 17 g by mouth at bedtime.     QUEtiapine (SEROQUEL) 25 MG tablet Take 25 mg by mouth 2 (two) times daily.     torsemide (DEMADEX) 20 MG tablet Take 1 tablet by mouth daily.     valACYclovir (VALTREX) 500 MG tablet TAKE 1 TABLET BY MOUTH EVERY DAY (Patient taking differently: as needed.) 90 tablet 0   bisoprolol (ZEBETA) 5 MG tablet Take 5 mg by mouth daily.     budesonide-formoterol (SYMBICORT) 160-4.5 MCG/ACT inhaler Inhale 2 puffs into the lungs 2 (two) times daily. Am and lunch     clobetasol ointment (TEMOVATE) 0.05 % APPLY VAGINALLY TWICE A NIGHT FOR NEXT 8 WEEKS 60 g 5   cyclobenzaprine (FLEXERIL) 10 MG tablet Take 10 mg by mouth daily as needed for muscle spasms.      diclofenac (VOLTAREN) 75 MG EC tablet Take 1 tablet (75 mg total) by mouth 2 (two) times daily as needed. 60 tablet 2   DULoxetine (CYMBALTA) 60 MG capsule Take 60 mg by mouth daily.     ferrous sulfate 325 (65 FE) MG tablet Take 325 mg by mouth 2 (two) times daily with a meal.   3   hydrochlorothiazide (HYDRODIURIL) 25 MG tablet Take 25 mg by mouth daily. am (Patient not taking: Reported on 05/22/2022)  11   hydrocortisone 2.5 % cream Apply topically 2 (two) times daily as needed (Rash). Twice daily for up to 1 week to eyelids as needed for rash. 30 g 0   oxyCODONE-acetaminophen (PERCOCET) 5-325 MG tablet Take 1-2 tablets by mouth every 6 (six) hours as needed for severe pain. Max 6 tabs per day 30 tablet 0   Pediatric Multivit-Minerals (FLINTSTONES COMPLETE PO) Take by mouth daily.      rosuvastatin (CRESTOR) 10 MG tablet Take 10 mg by mouth daily.     topiramate (TOPAMAX) 50 MG tablet Take 1 tablet by mouth 2 (two) times daily. (Patient not taking: Reported on 05/22/2022)     No current facility-administered medications on file prior to visit.    There are no Patient Instructions on file for this visit. No follow-ups on file.   Georgiana Spinner, NP

## 2023-12-21 ENCOUNTER — Ambulatory Visit: Admitting: Dermatology

## 2023-12-21 ENCOUNTER — Encounter: Payer: Self-pay | Admitting: Dermatology

## 2023-12-21 DIAGNOSIS — W908XXA Exposure to other nonionizing radiation, initial encounter: Secondary | ICD-10-CM | POA: Diagnosis not present

## 2023-12-21 DIAGNOSIS — Z1283 Encounter for screening for malignant neoplasm of skin: Secondary | ICD-10-CM | POA: Diagnosis not present

## 2023-12-21 DIAGNOSIS — L578 Other skin changes due to chronic exposure to nonionizing radiation: Secondary | ICD-10-CM

## 2023-12-21 DIAGNOSIS — L821 Other seborrheic keratosis: Secondary | ICD-10-CM

## 2023-12-21 DIAGNOSIS — L814 Other melanin hyperpigmentation: Secondary | ICD-10-CM

## 2023-12-21 DIAGNOSIS — D1801 Hemangioma of skin and subcutaneous tissue: Secondary | ICD-10-CM

## 2023-12-21 DIAGNOSIS — Z86018 Personal history of other benign neoplasm: Secondary | ICD-10-CM

## 2023-12-21 DIAGNOSIS — L820 Inflamed seborrheic keratosis: Secondary | ICD-10-CM

## 2023-12-21 DIAGNOSIS — D229 Melanocytic nevi, unspecified: Secondary | ICD-10-CM

## 2023-12-21 NOTE — Patient Instructions (Signed)

## 2023-12-21 NOTE — Progress Notes (Signed)
   Follow-Up Visit   Subjective  Mackenzie [name] is a 68 y.o. female who presents for the following: Skin Cancer Screening and Full Body Skin Exam The patient presents for Total-Body Skin Exam (TBSE) for skin cancer screening and mole check. The patient has spots, moles and lesions to be evaluated, some may be new or changing and the patient may have concern these could be cancer.  The following portions of the chart were reviewed this encounter and updated as appropriate: medications, allergies, medical history  Review of Systems:  No other skin or systemic complaints except as noted in HPI or Assessment and Plan.  Objective  Well appearing patient in no apparent distress; mood and affect are within normal limits.  A full examination was performed including scalp, head, eyes, ears, nose, lips, neck, chest, axillae, abdomen, back, buttocks, bilateral upper extremities, bilateral lower extremities, hands, feet, fingers, toes, fingernails, and toenails. All findings within normal limits unless otherwise noted below.   Relevant physical exam findings are noted in the Assessment and Plan.  L mid to low back x 1, R infra mammary x 1 (2) Erythematous stuck-on, waxy papule or plaque  Assessment & Plan   SKIN CANCER SCREENING PERFORMED TODAY.  ACTINIC DAMAGE - Chronic condition, secondary to cumulative UV/sun exposure - diffuse scaly erythematous macules with underlying dyspigmentation - Recommend daily broad spectrum sunscreen SPF 30+ to sun-exposed areas, reapply every 2 hours as needed.  - Staying in the shade or wearing long sleeves, sun glasses (UVA+UVB protection) and wide brim hats (4-inch brim around the entire circumference of the hat) are also recommended for sun protection.  - Call for new or changing lesions.  LENTIGINES, SEBORRHEIC KERATOSES, HEMANGIOMAS - Benign normal skin lesions - Benign-appearing - Call for any changes  MELANOCYTIC NEVI - Tan-brown and/or  pink-flesh-colored symmetric macules and papules - Benign appearing on exam today - Observation - Call clinic for new or changing moles - Recommend daily use of broad spectrum spf 30+ sunscreen to sun-exposed areas.   HISTORY OF DYSPLASTIC NEVUS No evidence of recurrence today Recommend regular full body skin exams Recommend daily broad spectrum sunscreen SPF 30+ to sun-exposed areas, reapply every 2 hours as needed.  Call if any new or changing lesions are noted between office visits   INFLAMED SEBORRHEIC KERATOSIS (2) L mid to low back x 1, R infra mammary x 1 (2) Symptomatic, irritating, patient would like treated.  Destruction of lesion - L mid to low back x 1, R infra mammary x 1 (2) Complexity: simple   Destruction method: cryotherapy   Informed consent: discussed and consent obtained   Timeout:  patient name, date of birth, surgical site, and procedure verified Lesion destroyed using liquid nitrogen: Yes   Region frozen until ice ball extended beyond lesion: Yes   Outcome: patient tolerated procedure well with no complications   Post-procedure details: wound care instructions given    Return in about 1 year (around 12/20/2024) for TBSE.  Arlinda Lais, CMA, am acting as scribe for Celine Collard, MD .   Documentation: I have reviewed the above documentation for accuracy and completeness, and I agree with the above.  Celine Collard, MD

## 2024-01-26 ENCOUNTER — Encounter (INDEPENDENT_AMBULATORY_CARE_PROVIDER_SITE_OTHER): Payer: Self-pay

## 2024-03-29 ENCOUNTER — Ambulatory Visit: Attending: Internal Medicine

## 2024-03-29 DIAGNOSIS — G4733 Obstructive sleep apnea (adult) (pediatric): Secondary | ICD-10-CM | POA: Diagnosis not present

## 2024-03-29 DIAGNOSIS — G4710 Hypersomnia, unspecified: Secondary | ICD-10-CM | POA: Insufficient documentation

## 2024-03-29 DIAGNOSIS — R0683 Snoring: Secondary | ICD-10-CM | POA: Diagnosis present

## 2024-03-30 ENCOUNTER — Encounter

## 2024-04-04 ENCOUNTER — Encounter: Admission: RE | Payer: Self-pay | Source: Home / Self Care

## 2024-04-04 ENCOUNTER — Ambulatory Visit: Admission: RE | Admit: 2024-04-04 | Source: Home / Self Care

## 2024-04-04 SURGERY — EGD (ESOPHAGOGASTRODUODENOSCOPY)
Anesthesia: General

## 2024-12-20 ENCOUNTER — Ambulatory Visit: Admitting: Dermatology
